# Patient Record
Sex: Female | Born: 1935 | Race: White | Hispanic: No | Smoking: Never smoker
Health system: Southern US, Community
[De-identification: ages and names within clinical notes are randomized; demographics above are authoritative.]

## PROBLEM LIST (undated history)

## (undated) DIAGNOSIS — E119 Type 2 diabetes mellitus without complications: Secondary | ICD-10-CM

## (undated) DIAGNOSIS — M1990 Unspecified osteoarthritis, unspecified site: Secondary | ICD-10-CM

## (undated) DIAGNOSIS — F329 Major depressive disorder, single episode, unspecified: Secondary | ICD-10-CM

## (undated) DIAGNOSIS — F32A Depression, unspecified: Secondary | ICD-10-CM

## (undated) DIAGNOSIS — I10 Essential (primary) hypertension: Secondary | ICD-10-CM

## (undated) DIAGNOSIS — F0390 Unspecified dementia without behavioral disturbance: Secondary | ICD-10-CM

## (undated) DIAGNOSIS — E7800 Pure hypercholesterolemia, unspecified: Secondary | ICD-10-CM

## (undated) HISTORY — PX: CATARACT EXTRACTION W/ INTRAOCULAR LENS  IMPLANT, BILATERAL: SHX1307

---

## 1999-06-09 ENCOUNTER — Encounter (INDEPENDENT_AMBULATORY_CARE_PROVIDER_SITE_OTHER): Payer: Self-pay | Admitting: Specialist

## 1999-06-09 ENCOUNTER — Other Ambulatory Visit: Admission: RE | Admit: 1999-06-09 | Discharge: 1999-06-09 | Payer: Self-pay | Admitting: *Deleted

## 1999-07-17 ENCOUNTER — Encounter: Payer: Self-pay | Admitting: Geriatric Medicine

## 1999-07-17 ENCOUNTER — Encounter: Admission: RE | Admit: 1999-07-17 | Discharge: 1999-07-17 | Payer: Self-pay | Admitting: Geriatric Medicine

## 2001-11-16 ENCOUNTER — Encounter: Payer: Self-pay | Admitting: Geriatric Medicine

## 2001-11-16 ENCOUNTER — Encounter: Admission: RE | Admit: 2001-11-16 | Discharge: 2001-11-16 | Payer: Self-pay | Admitting: Geriatric Medicine

## 2001-12-09 ENCOUNTER — Ambulatory Visit (HOSPITAL_COMMUNITY): Admission: RE | Admit: 2001-12-09 | Discharge: 2001-12-09 | Payer: Self-pay | Admitting: Gastroenterology

## 2001-12-09 ENCOUNTER — Encounter (INDEPENDENT_AMBULATORY_CARE_PROVIDER_SITE_OTHER): Payer: Self-pay | Admitting: Specialist

## 2003-11-17 ENCOUNTER — Other Ambulatory Visit: Admission: RE | Admit: 2003-11-17 | Discharge: 2003-11-17 | Payer: Self-pay | Admitting: Geriatric Medicine

## 2004-11-20 ENCOUNTER — Other Ambulatory Visit: Admission: RE | Admit: 2004-11-20 | Discharge: 2004-11-20 | Payer: Self-pay | Admitting: Geriatric Medicine

## 2004-11-27 ENCOUNTER — Ambulatory Visit (HOSPITAL_COMMUNITY): Admission: RE | Admit: 2004-11-27 | Discharge: 2004-11-27 | Payer: Self-pay | Admitting: Geriatric Medicine

## 2005-10-15 ENCOUNTER — Emergency Department (HOSPITAL_COMMUNITY): Admission: EM | Admit: 2005-10-15 | Discharge: 2005-10-15 | Payer: Self-pay | Admitting: Family Medicine

## 2005-11-28 ENCOUNTER — Ambulatory Visit (HOSPITAL_COMMUNITY): Admission: RE | Admit: 2005-11-28 | Discharge: 2005-11-28 | Payer: Self-pay | Admitting: Geriatric Medicine

## 2005-12-15 ENCOUNTER — Encounter: Admission: RE | Admit: 2005-12-15 | Discharge: 2005-12-15 | Payer: Self-pay | Admitting: Geriatric Medicine

## 2005-12-23 ENCOUNTER — Encounter: Admission: RE | Admit: 2005-12-23 | Discharge: 2005-12-23 | Payer: Self-pay | Admitting: Geriatric Medicine

## 2006-02-23 ENCOUNTER — Encounter: Admission: RE | Admit: 2006-02-23 | Discharge: 2006-02-23 | Payer: Self-pay | Admitting: Orthopedic Surgery

## 2006-03-10 ENCOUNTER — Encounter: Admission: RE | Admit: 2006-03-10 | Discharge: 2006-03-10 | Payer: Self-pay | Admitting: Orthopedic Surgery

## 2006-03-13 ENCOUNTER — Ambulatory Visit (HOSPITAL_BASED_OUTPATIENT_CLINIC_OR_DEPARTMENT_OTHER): Admission: RE | Admit: 2006-03-13 | Discharge: 2006-03-14 | Payer: Self-pay | Admitting: Orthopedic Surgery

## 2006-10-08 ENCOUNTER — Encounter: Admission: RE | Admit: 2006-10-08 | Discharge: 2006-10-08 | Payer: Self-pay | Admitting: Orthopedic Surgery

## 2006-12-01 ENCOUNTER — Ambulatory Visit (HOSPITAL_COMMUNITY): Admission: RE | Admit: 2006-12-01 | Discharge: 2006-12-01 | Payer: Self-pay | Admitting: Geriatric Medicine

## 2007-02-02 ENCOUNTER — Encounter: Admission: RE | Admit: 2007-02-02 | Discharge: 2007-02-02 | Payer: Self-pay | Admitting: Geriatric Medicine

## 2007-12-03 ENCOUNTER — Ambulatory Visit (HOSPITAL_COMMUNITY): Admission: RE | Admit: 2007-12-03 | Discharge: 2007-12-03 | Payer: Self-pay | Admitting: Geriatric Medicine

## 2008-03-24 ENCOUNTER — Other Ambulatory Visit: Admission: RE | Admit: 2008-03-24 | Discharge: 2008-03-24 | Payer: Self-pay | Admitting: Geriatric Medicine

## 2008-12-04 ENCOUNTER — Ambulatory Visit (HOSPITAL_COMMUNITY): Admission: RE | Admit: 2008-12-04 | Discharge: 2008-12-04 | Payer: Self-pay | Admitting: Geriatric Medicine

## 2008-12-08 ENCOUNTER — Encounter: Admission: RE | Admit: 2008-12-08 | Discharge: 2008-12-08 | Payer: Self-pay | Admitting: Geriatric Medicine

## 2009-05-14 ENCOUNTER — Encounter: Admission: RE | Admit: 2009-05-14 | Discharge: 2009-05-14 | Payer: Self-pay | Admitting: Geriatric Medicine

## 2009-11-14 ENCOUNTER — Encounter: Admission: RE | Admit: 2009-11-14 | Discharge: 2009-11-14 | Payer: Self-pay | Admitting: Geriatric Medicine

## 2010-03-29 ENCOUNTER — Other Ambulatory Visit: Admission: RE | Admit: 2010-03-29 | Discharge: 2010-03-29 | Payer: Self-pay | Admitting: Geriatric Medicine

## 2010-04-01 ENCOUNTER — Encounter: Admission: RE | Admit: 2010-04-01 | Discharge: 2010-04-01 | Payer: Self-pay | Admitting: Geriatric Medicine

## 2010-12-10 ENCOUNTER — Other Ambulatory Visit (HOSPITAL_COMMUNITY): Payer: Self-pay | Admitting: Geriatric Medicine

## 2010-12-10 DIAGNOSIS — Z1231 Encounter for screening mammogram for malignant neoplasm of breast: Secondary | ICD-10-CM

## 2010-12-19 ENCOUNTER — Ambulatory Visit (HOSPITAL_COMMUNITY)
Admission: RE | Admit: 2010-12-19 | Discharge: 2010-12-19 | Disposition: A | Discharge status: Home / Self Care | Payer: MEDICARE | Source: Ambulatory Visit | Attending: Geriatric Medicine | Admitting: Geriatric Medicine

## 2010-12-19 DIAGNOSIS — Z1231 Encounter for screening mammogram for malignant neoplasm of breast: Secondary | ICD-10-CM

## 2010-12-27 NOTE — Procedures (Signed)
. [facility]  Patient:    Brandy Bailey, Brandy Bailey Visit Number: [id number] MRN: [id number]          Service Type: END Location: ENDO Attending Physician:  Dennison Bulla Ii Dictated by:   Verlin Grills, M.D. Proc. Date: 12/09/01 Admit Date:  12/09/2001 Discharge Date: 12/09/2001   CC:         Hal T. Stoneking, M.D.   Procedure Report  REFERRING PHYSICIAN:  Hal T. Stoneking, M.D.  PROCEDURE:  Colonoscopy with colonic polypectomy.  PROCEDURE INDICATION:  Brandy Bailey is a 77 year old female born 07-Jan-1927.  Approximately five years ago Brandy Bailey underwent a colonoscopy and a villous adenomatous polyp was removed from her colon.  She is due for screening colonoscopy with polypectomy to prevent colon cancer.  ENDOSCOPIST:  Verlin Grills, M.D.  PREMEDICATION:  Versed 5 mg, fentanyl 50 mcg.  ENDOSCOPE:  Olympus pediatric colonoscope.  DESCRIPTION OF PROCEDURE:  After obtaining informed consent, Brandy Bailey was placed in the left lateral decubitus position.  I administered intravenous Versed and intravenous fentanyl to achieve conscious sedation for the procedure.  The patients blood pressure, oxygen saturation, and cardiac rhythm were monitored throughout the procedure and documented in the medical record.  Anal inspection was normal.  Digital rectal exam was normal.  The Olympus pediatric video colonoscope was introduced into the rectum and easily advanced to the cecum.  Colonic preparation for the exam today was excellent.  Rectum normal.  Sigmoid colon and descending colon:  Small colonic diverticula.  Splenic flexure normal.  Transverse colon normal.  Hepatic flexure normal.  Ascending colon:  From the midascending colon, a 2 mm sessile polyp was removed with the hot biopsy forceps and submitted for pathologic interpretation.  Cecum and ileocecal valve normal.  ASSESSMENT: 1. In 1998, a villous  adenomatous polyp was removed from the colon    colonoscopically. 2. From the midascending colon, a 2 mm sessile polyp was removed with the hot    biopsy forceps. 3. Left colonic diverticulosis.  RECOMMENDATIONS:  Repeat colonoscopy in five years. Dictated by:   Verlin Grills, M.D. Attending Physician:  Dennison Bulla Ii DD:  12/09/01 TD:  12/10/01 Job: [id number]

## 2010-12-27 NOTE — Op Note (Signed)
NAME:  THAILA, BOTTOMS NO.:  [id number]   MEDICAL RECORD NO.:  [id number]          PATIENT TYPE:  AMB   LOCATION:  DSC                          FACILITY:  [facility]   PHYSICIAN:  Dyke Brackett, M.D.    DATE OF BIRTH:  [birth year]   DATE OF PROCEDURE:  03/13/2006  DATE OF DISCHARGE:                                 OPERATIVE REPORT   SURGEON:  Dyke Brackett, M.D.   PREOPERATIVE DIAGNOSIS:  Large tear, complete tear of infraspinatus and  supraspinatus.   POSTOPERATIVE DIAGNOSES:  Large tear, complete tear of infraspinatus and  supraspinatus, degenerative tear of anterior superior labrum, partial biceps  tendon tear, and impingement with acromioclavicular joint arthritis.   OPERATION:  1. Open rotator cuff repair, acromioplasty.  2. Labral debridement (arthroscopic).  3. Open acromioclavicular-distal clavicle excision.   ANESTHESIA:  General with a block.   INDICATIONS:  A 77 year old female with a painful right rotator cuff tear  thought to be amenable to overnight surgery.   DESCRIPTION OF PROCEDURE:  Arthroscoped through posterolateral and anterior  portal.  Severe complete tear of the supraspinatus and infraspinatus was  present.  Fortunately, this was mobilizable enough to repair.  She had an  extensive degenerative tear in the anterior superior labrum, which was  debrided, extensive partial tear, and probably 50% to 75% of the biceps  undersurface tearing, debrided.  Cuff tissue was mobilized.  Again,  glenohumeral debridement with the arthroscope was carried out separate from  the open procedure.  Incision was made, splitting the [facility] joint, deltoid  interval.  Dissection carried to about 2 cm distal to the tip of the  acromion.  Severe impingement from the tip of the acromion, as well as the  [facility] joint was noted.  Distal clavicle excised.  The cuff was repaired with  mobilization of the tissues; extensive mobilization.  The cuff was repaired  back to a good  stable edge with a double-row technique with 2 Arthrex  anchors more distal and 1 more proximal for a total of 6 sutures in the  repaired cuff.  This was done onto a bleeding bone surface, which was  burred.  Good tissue actually was obtained for repair with no undue tension.  The deltoid was repaired back with #1 Tycron, the subcutaneous tissues with  Vicryl.  The patient was placed in a sling, taken to the recovery room in  stable condition.      Dyke Brackett, M.D.  Electronically Signed     WDC/MEDQ  D:  03/13/2006  T:  03/13/2006  Job:  [id number]

## 2011-04-11 ENCOUNTER — Other Ambulatory Visit: Payer: Self-pay | Admitting: Geriatric Medicine

## 2011-04-11 DIAGNOSIS — E049 Nontoxic goiter, unspecified: Secondary | ICD-10-CM

## 2011-04-17 ENCOUNTER — Ambulatory Visit
Admission: RE | Admit: 2011-04-17 | Discharge: 2011-04-17 | Disposition: A | Discharge status: Home / Self Care | Payer: Medicare Other | Source: Ambulatory Visit | Attending: Geriatric Medicine | Admitting: Geriatric Medicine

## 2011-04-17 DIAGNOSIS — E049 Nontoxic goiter, unspecified: Secondary | ICD-10-CM

## 2012-04-23 ENCOUNTER — Other Ambulatory Visit (HOSPITAL_COMMUNITY): Payer: Self-pay | Admitting: Geriatric Medicine

## 2012-04-23 DIAGNOSIS — Z1231 Encounter for screening mammogram for malignant neoplasm of breast: Secondary | ICD-10-CM

## 2012-05-03 ENCOUNTER — Ambulatory Visit (HOSPITAL_COMMUNITY)
Admission: RE | Admit: 2012-05-03 | Discharge: 2012-05-03 | Disposition: A | Discharge status: Home / Self Care | Payer: Medicare Other | Source: Ambulatory Visit | Attending: Geriatric Medicine | Admitting: Geriatric Medicine

## 2012-05-03 DIAGNOSIS — Z1231 Encounter for screening mammogram for malignant neoplasm of breast: Secondary | ICD-10-CM

## 2012-10-13 ENCOUNTER — Emergency Department (HOSPITAL_COMMUNITY): Payer: Medicare Other

## 2012-10-13 ENCOUNTER — Encounter (HOSPITAL_COMMUNITY): Payer: Self-pay | Admitting: Emergency Medicine

## 2012-10-13 ENCOUNTER — Inpatient Hospital Stay (HOSPITAL_COMMUNITY)
Admission: EM | Admit: 2012-10-13 | Discharge: 2012-10-18 | DRG: 481 | Disposition: A | Discharge status: Skilled Nursing Facility | Payer: Medicare Other | Attending: Family Medicine | Admitting: Family Medicine

## 2012-10-13 DIAGNOSIS — S7223XA Displaced subtrochanteric fracture of unspecified femur, initial encounter for closed fracture: Principal | ICD-10-CM | POA: Diagnosis present

## 2012-10-13 DIAGNOSIS — R55 Syncope and collapse: Secondary | ICD-10-CM

## 2012-10-13 DIAGNOSIS — D696 Thrombocytopenia, unspecified: Secondary | ICD-10-CM | POA: Diagnosis not present

## 2012-10-13 DIAGNOSIS — Z79899 Other long term (current) drug therapy: Secondary | ICD-10-CM

## 2012-10-13 DIAGNOSIS — D62 Acute posthemorrhagic anemia: Secondary | ICD-10-CM

## 2012-10-13 DIAGNOSIS — Z7982 Long term (current) use of aspirin: Secondary | ICD-10-CM

## 2012-10-13 DIAGNOSIS — R209 Unspecified disturbances of skin sensation: Secondary | ICD-10-CM | POA: Diagnosis present

## 2012-10-13 DIAGNOSIS — Z66 Do not resuscitate: Secondary | ICD-10-CM | POA: Diagnosis present

## 2012-10-13 DIAGNOSIS — I10 Essential (primary) hypertension: Secondary | ICD-10-CM

## 2012-10-13 DIAGNOSIS — W19XXXA Unspecified fall, initial encounter: Secondary | ICD-10-CM

## 2012-10-13 DIAGNOSIS — E119 Type 2 diabetes mellitus without complications: Secondary | ICD-10-CM

## 2012-10-13 DIAGNOSIS — G459 Transient cerebral ischemic attack, unspecified: Secondary | ICD-10-CM

## 2012-10-13 DIAGNOSIS — S72002D Fracture of unspecified part of neck of left femur, subsequent encounter for closed fracture with routine healing: Secondary | ICD-10-CM

## 2012-10-13 DIAGNOSIS — E785 Hyperlipidemia, unspecified: Secondary | ICD-10-CM | POA: Diagnosis present

## 2012-10-13 DIAGNOSIS — S72002A Fracture of unspecified part of neck of left femur, initial encounter for closed fracture: Secondary | ICD-10-CM

## 2012-10-13 DIAGNOSIS — M1990 Unspecified osteoarthritis, unspecified site: Secondary | ICD-10-CM | POA: Diagnosis present

## 2012-10-13 HISTORY — DX: Unspecified osteoarthritis, unspecified site: M19.90

## 2012-10-13 HISTORY — DX: Essential (primary) hypertension: I10

## 2012-10-13 LAB — CBC
HCT: 30 % — ABNORMAL LOW (ref 36.0–46.0)
Hemoglobin: 9.7 g/dL — ABNORMAL LOW (ref 12.0–15.0)
MCHC: 32.3 g/dL (ref 30.0–36.0)
MCV: 84 fL (ref 78.0–100.0)
RDW: 15 % (ref 11.5–15.5)
WBC: 6.1 10*3/uL (ref 4.0–10.5)

## 2012-10-13 LAB — CREATININE, SERUM
GFR calc Af Amer: 88 mL/min — ABNORMAL LOW (ref >90)
GFR calc non Af Amer: 76 mL/min — ABNORMAL LOW (ref >90)

## 2012-10-13 LAB — URINALYSIS, MICROSCOPIC ONLY
Glucose, UA: NEGATIVE mg/dL
Hgb urine dipstick: NEGATIVE
Protein, ur: 30 mg/dL — AB
Specific Gravity, Urine: 1.023 (ref 1.005–1.030)

## 2012-10-13 LAB — CBC WITH DIFFERENTIAL/PLATELET
Basophils Absolute: 0 10*3/uL (ref 0.0–0.1)
Eosinophils Relative: 1 % (ref 0–5)
Lymphocytes Relative: 17 % (ref 12–46)
Neutro Abs: 3.1 10*3/uL (ref 1.7–7.7)
Neutrophils Relative %: 75 % (ref 43–77)
Platelets: 176 10*3/uL (ref 150–400)
RDW: 15 % (ref 11.5–15.5)
WBC: 4.1 10*3/uL (ref 4.0–10.5)

## 2012-10-13 LAB — BASIC METABOLIC PANEL
Calcium: 10 mg/dL (ref 8.4–10.5)
Creatinine, Ser: 0.75 mg/dL (ref 0.50–1.10)
GFR calc Af Amer: 88 mL/min — ABNORMAL LOW (ref >90)
GFR calc non Af Amer: 76 mL/min — ABNORMAL LOW (ref >90)

## 2012-10-13 LAB — TROPONIN I: Troponin I: <0.3 ng/mL (ref <0.30)

## 2012-10-13 LAB — PROTIME-INR: Prothrombin Time: 13.1 seconds (ref 11.6–15.2)

## 2012-10-13 LAB — ABO/RH: ABO/RH(D): O POS

## 2012-10-13 MED ORDER — SODIUM CHLORIDE 0.9 % IV SOLN
INTRAVENOUS | Status: DC
  Administered 2012-10-13: 20:00:00 via INTRAVENOUS

## 2012-10-13 MED ORDER — SODIUM CHLORIDE 0.9 % IV SOLN
1000.0000 mL | INTRAVENOUS | Status: DC
  Administered 2012-10-13: 1000 mL via INTRAVENOUS

## 2012-10-13 MED ORDER — MORPHINE SULFATE 4 MG/ML IJ SOLN: 0.5000 mg | INTRAMUSCULAR | Status: DC | PRN

## 2012-10-13 MED ORDER — SIMVASTATIN 40 MG PO TABS
40.0000 mg | ORAL_TABLET | Freq: Every evening | ORAL | Status: DC
  Administered 2012-10-13 – 2012-10-17 (×5): 40 mg via ORAL
  Filled 2012-10-13 (×6): qty 1

## 2012-10-13 MED ORDER — FENTANYL CITRATE 0.05 MG/ML IJ SOLN
50.0000 ug | INTRAMUSCULAR | Status: AC | PRN
  Administered 2012-10-13 (×2): 50 ug via INTRAVENOUS
  Filled 2012-10-13: qty 2

## 2012-10-13 MED ORDER — ASPIRIN EC 81 MG PO TBEC
81.0000 mg | DELAYED_RELEASE_TABLET | Freq: Every day | ORAL | Status: DC
  Administered 2012-10-13 – 2012-10-18 (×6): 81 mg via ORAL
  Filled 2012-10-13 (×6): qty 1

## 2012-10-13 MED ORDER — MORPHINE SULFATE 4 MG/ML IJ SOLN
INTRAMUSCULAR | Status: AC
  Filled 2012-10-13: qty 1

## 2012-10-13 MED ORDER — ENOXAPARIN SODIUM 30 MG/0.3ML ~~LOC~~ SOLN
30.0000 mg | SUBCUTANEOUS | Status: DC
  Administered 2012-10-13: 30 mg via SUBCUTANEOUS
  Filled 2012-10-13 (×2): qty 0.3

## 2012-10-13 MED ORDER — MIRTAZAPINE 7.5 MG PO TABS
7.5000 mg | ORAL_TABLET | Freq: Every day | ORAL | Status: DC
  Administered 2012-10-13 – 2012-10-17 (×4): 7.5 mg via ORAL
  Filled 2012-10-13 (×6): qty 1

## 2012-10-13 MED ORDER — MORPHINE SULFATE 4 MG/ML IJ SOLN
1.0000 mg | INTRAMUSCULAR | Status: DC | PRN
  Administered 2012-10-13: 1 mg via INTRAVENOUS
  Filled 2012-10-13: qty 1

## 2012-10-13 MED ORDER — DOCUSATE SODIUM 100 MG PO CAPS
100.0000 mg | ORAL_CAPSULE | Freq: Two times a day (BID) | ORAL | Status: DC
  Administered 2012-10-13 – 2012-10-18 (×9): 100 mg via ORAL
  Filled 2012-10-13 (×11): qty 1

## 2012-10-13 MED ORDER — HYDROCODONE-ACETAMINOPHEN 5-325 MG PO TABS
1.0000 | ORAL_TABLET | Freq: Four times a day (QID) | ORAL | Status: DC | PRN
  Administered 2012-10-13 – 2012-10-14 (×3): 1 via ORAL
  Filled 2012-10-13 (×3): qty 1

## 2012-10-13 NOTE — ED Notes (Signed)
Onset today 1030 patient fell left arm and and left weakness onto tile floor at a store.  EMS called states left arm and left leg weakness resolved however left hip pain 7/10 sharp position of comfort 3/10 sharp. Pedal pulses present full sensation. EMS reported left lower extremity shortening and external rotation.

## 2012-10-13 NOTE — H&P (Signed)
[facility] History and Physical  Brandy Bailey ZOX:[id number] DOB: 07/13/1927 DOA: 10/13/2012  Referring physician: Dr Bernette Mayers. PCP: Ginette Otto, MD  Specialists: Dr Dion Saucier.  Chief Complaint: Fall.   HPI: Brandy Bailey is a 79 y.o. female with PMH significant for HTN, Diabetes who presents after a fall, questionable of syncope episode. Patient was shoping with daughter when her left side become numb, lightheaded, and she fell to left side. She is not sure if she pass out. She think maybe she pass out for few seconds. She denies chest pain, dyspnea, slurred speech. I was not able to speak with her daughter. She relates prior episodes of left side numbness. she was told by her PCP this is could be pinch nerve.   Review of Systems: The patient denies anorexia, ,, vision loss, decreased hearing, hoarseness, chest pain, , dyspnea on exertion, peripheral edema,  hemoptysis, abdominal pain, melena, hematochezia, severe indigestion/heartburn, hematuria, incontinence, genital sores, muscle weakness, suspicious skin lesions, transient blindness, difficulty walking, depression, , abnormal bleeding, enlarged lymph nodes   Past Medical History  Diagnosis Date  . Hypertension   . Osteoarthritis   . Diabetes mellitus without complication    History reviewed. No pertinent past surgical history. Social History:  reports that she has never smoked. She does not have any smokeless tobacco history on file. She reports that she does not drink alcohol or use illicit drugs. Lives with Daughter. Her daughter has Ca and drink alcohol. She has 2 living daughter and 2 deceased. One died at age of 1 had Down syndrome.     No Known Allergies  Family History: Father died, MI, had history prostate Ca.                          Mother: Died MI.   Prior to Admission medications   Medication Sig Start Date End Date Taking? Authorizing Provider  amLODipine (NORVASC) 10 MG tablet Take 10 mg by mouth  daily.   Yes Historical Provider, MD  aspirin EC 81 MG tablet Take 81 mg by mouth daily.   Yes Historical Provider, MD  Calcium Carbonate-Vitamin D (CALCIUM PLUS VITAMIN D PO) Take 1 tablet by mouth 2 (two) times daily.   Yes Historical Provider, MD  hydrochlorothiazide (HYDRODIURIL) 50 MG tablet Take 25 mg by mouth daily.   Yes Historical Provider, MD  lisinopril (PRINIVIL,ZESTRIL) 10 MG tablet Take 10 mg by mouth daily.   Yes Historical Provider, MD  mirtazapine (REMERON) 15 MG tablet Take 7.5 mg by mouth at bedtime.   Yes Historical Provider, MD  sertraline (ZOLOFT) 100 MG tablet Take 100 mg by mouth at bedtime.   Yes Historical Provider, MD  simvastatin (ZOCOR) 40 MG tablet Take 40 mg by mouth every evening.   Yes Historical Provider, MD   Physical Exam: Filed Vitals:   10/13/12 1200 10/13/12 1230 10/13/12 1417 10/13/12 1450  BP: 114/55 100/53 104/50 106/50  Pulse: 66 63  64  Temp:      TempSrc:      Resp: 14 16 16 16   SpO2: 100% 99% 99% 99%   General Appearance:    Alert, cooperative, no distress, appears stated age  Head:    Normocephalic, without obvious abnormality, atraumatic  Eyes:    PERRL, conjunctiva/corneas clear, EOM's intact,    Ears:    Normal TM's and external ear canals, both ears  Nose:   Nares normal, septum midline, mucosa normal, no drainage  or sinus tenderness  Throat:   Lips, mucosa, and tongue normal; teeth and gums normal  Neck:   Supple, symmetrical, trachea midline, no adenopathy;    thyroid:  no enlargement/tenderness/nodules; no carotid   bruit or JVD  Back:     Symmetric, no curvature, ROM normal, no CVA tenderness  Lungs:     Clear to auscultation bilaterally, respirations unlabored  Chest Wall:    No tenderness or deformity   Heart:    Regular rate and rhythm, S1 and S2 normal, systolic murmur, rub   or gallop     Abdomen:     Soft, non-tender, bowel sounds active all four quadrants,    no masses, no organomegaly        Extremities:   Left LE  rotated, Right LE no edema.   Pulses:   2+ and symmetric all extremities  Skin:   Skin color, texture, turgor normal, no rashes or lesions  Lymph nodes:   Cervical, supraclavicular, and axillary nodes normal  Neurologic:   CNII-XII intact, normal strength, left LE difficult evaluation due to pain, , sensation and reflexes    throughout      Labs on Admission:  Basic Metabolic Panel:  Recent Labs Lab 10/13/12 1155  NA 140  K 3.7  CL 100  CO2 27  GLUCOSE 117*  BUN 25*  CREATININE 0.75  CALCIUM 10.0   Liver Function Tests: No results found for this basename: AST, ALT, ALKPHOS, BILITOT, PROT, ALBUMIN,  in the last 168 hours No results found for this basename: LIPASE, AMYLASE,  in the last 168 hours No results found for this basename: AMMONIA,  in the last 168 hours CBC:  Recent Labs Lab 10/13/12 1155  WBC 4.1  NEUTROABS 3.1  HGB 12.7  HCT 38.8  MCV 83.6  PLT 176   Cardiac Enzymes:  Recent Labs Lab 10/13/12 1155  TROPONINI <0.30    BNP (last 3 results) No results found for this basename: PROBNP,  in the last 8760 hours CBG: No results found for this basename: GLUCAP,  in the last 168 hours  Radiological Exams on Admission: Dg Chest 1 View  10/13/2012  *RADIOLOGY REPORT*  Clinical Data: Fall, dizziness, weakness  CHEST - 1 VIEW  Comparison: 03/10/2006  Findings: Chronic interstitial markings/emphysematous changes.  No pleural effusion or pneumothorax.  The heart is normal in size.  Large hiatal hernia.  IMPRESSION: No evidence of acute cardiopulmonary disease.   Original Report Authenticated By: Charline Bills, M.D.    Dg Hip Complete Left  10/13/2012  *RADIOLOGY REPORT*  Clinical Data: Fall, left hip pain  LEFT HIP - COMPLETE 2+ VIEW  Comparison: None.  Findings: Comminuted intertrochanteric left hip fracture. Foreshortening with varus deformity.  Displaced lesser trochanteric fragment.  Mild degenerative changes of the right hip and lower lumbar spine.   IMPRESSION: Comminuted intertrochanteric left hip fracture, as described above.   Original Report Authenticated By: Charline Bills, M.D.    Ct Head Wo Contrast  10/13/2012  *RADIOLOGY REPORT*  Clinical Data: Fall.  Left sided numbness.  High blood pressure.  .  CT HEAD WITHOUT CONTRAST  Technique:  Contiguous axial images were obtained from the base of the skull through the vertex without contrast.  Comparison: None.  Findings: No skull fracture or intracranial hemorrhage.  Small vessel disease type changes without CT evidence of large acute infarct.  It would be difficult to exclude a small infarct given the white matter type changes.  Vascular calcifications.  Probable  calcification of the anterior falx rather than small meningioma.  Overall, no intracranial mass lesion noted on this unenhanced exam.  No hydrocephalus.  Mastoid air cells, middle ear cavities and visualized sinuses are clear  IMPRESSION: No skull fracture or intracranial hemorrhage.  Small vessel disease type changes.  Please see above discussion.   Original Report Authenticated By: Lacy Duverney, M.D.     EKG: Independently reviewed. Sinus rhythm.   Assessment/Plan Active Problems:   Closed left hip fracture   Syncope   Diabetes   Hypertension   1-Syncope ? : Admit to telemetry, cycle cardiac enzymes, Will order ECHO. MRI brain rule out stroke. Maybe related to hypotension. Might be dificult to perform orthostatic due to hip fracture.  2-Left Hip fracture: Will need MRI brain, ECHO result prior to proceed with surgery.  3-HTN: Hold BP medications SBP in the 100 range.  4-Transient left side numbness: CT head negative. MRI brain ordered. Continue with aspirin. 5-DVT prophylaxis: Lovenox.   Code Status: Patient wishes to be DNR. She understand DNR is cancel during surgery.  Family Communication: Care discussed with patient.  Disposition Plan: Expect 4 to 5 days.  Time spent: 70 minutes.   REGALADO,BELKYS [facility] Pager [phone]  If 7PM-7AM, please contact night-coverage [email or web address] Password [facility] 10/13/2012, 3:09 PM

## 2012-10-13 NOTE — ED Notes (Signed)
ECHO called 2D ECHO to be performed at bedside.

## 2012-10-13 NOTE — ED Notes (Signed)
Spoke with Admit Doctor to change pain medication order

## 2012-10-13 NOTE — ED Notes (Signed)
ECHO at bedside.

## 2012-10-13 NOTE — ED Notes (Signed)
Room assignment being changed due to admission orders.

## 2012-10-13 NOTE — Progress Notes (Signed)
  Echocardiogram 2D Echocardiogram has been performed.  [place] 10/13/2012, 4:32 PM

## 2012-10-13 NOTE — Consult Note (Signed)
ORTHOPAEDIC CONSULTATION  REQUESTING PHYSICIAN: Brandy Cory, MD  Chief Complaint: Left hip pain  HPI: Brandy Bailey is a 79 y.o. female who complains of  left hip pain after a fall. There is some question whether or not she lost consciousness. She did not have any pre-existing left hip pain. The pain is rated as moderate to severe, better with pain medication, worse with movement.  Past Medical History  Diagnosis Date  . Hypertension   . Osteoarthritis   . Diabetes mellitus without complication    History reviewed. No pertinent past surgical history. History   Social History  . Marital Status: Widowed    Spouse Name: N/A    Number of Children: N/A  . Years of Education: N/A   Social History Main Topics  . Smoking status: Never Smoker   . Smokeless tobacco: None  . Alcohol Use: No  . Drug Use: No  . Sexually Active: None   Other Topics Concern  . None   Social History Narrative  . None   No family history on file. her father died of a heart attack in his 45s. No Known Allergies Prior to Admission medications   Medication Sig Start Date End Date Taking? Authorizing Provider  amLODipine (NORVASC) 10 MG tablet Take 10 mg by mouth daily.   Yes Historical Provider, MD  aspirin EC 81 MG tablet Take 81 mg by mouth daily.   Yes Historical Provider, MD  Calcium Carbonate-Vitamin D (CALCIUM PLUS VITAMIN D PO) Take 1 tablet by mouth 2 (two) times daily.   Yes Historical Provider, MD  hydrochlorothiazide (HYDRODIURIL) 50 MG tablet Take 25 mg by mouth daily.   Yes Historical Provider, MD  lisinopril (PRINIVIL,ZESTRIL) 10 MG tablet Take 10 mg by mouth daily.   Yes Historical Provider, MD  mirtazapine (REMERON) 15 MG tablet Take 7.5 mg by mouth at bedtime.   Yes Historical Provider, MD  sertraline (ZOLOFT) 100 MG tablet Take 100 mg by mouth at bedtime.   Yes Historical Provider, MD  simvastatin (ZOCOR) 40 MG tablet Take 40 mg by mouth every evening.   Yes Historical  Provider, MD   Dg Chest 1 View  10/13/2012  *RADIOLOGY REPORT*  Clinical Data: Fall, dizziness, weakness  CHEST - 1 VIEW  Comparison: 03/10/2006  Findings: Chronic interstitial markings/emphysematous changes.  No pleural effusion or pneumothorax.  The heart is normal in size.  Large hiatal hernia.  IMPRESSION: No evidence of acute cardiopulmonary disease.   Original Report Authenticated By: Charline Bills, M.D.    Dg Hip Complete Left  10/13/2012  *RADIOLOGY REPORT*  Clinical Data: Fall, left hip pain  LEFT HIP - COMPLETE 2+ VIEW  Comparison: None.  Findings: Comminuted intertrochanteric left hip fracture. Foreshortening with varus deformity.  Displaced lesser trochanteric fragment.  Mild degenerative changes of the right hip and lower lumbar spine.  IMPRESSION: Comminuted intertrochanteric left hip fracture, as described above.   Original Report Authenticated By: Charline Bills, M.D.    Ct Head Wo Contrast  10/13/2012  *RADIOLOGY REPORT*  Clinical Data: Fall.  Left sided numbness.  High blood pressure.  .  CT HEAD WITHOUT CONTRAST  Technique:  Contiguous axial images were obtained from the base of the skull through the vertex without contrast.  Comparison: None.  Findings: No skull fracture or intracranial hemorrhage.  Small vessel disease type changes without CT evidence of large acute infarct.  It would be difficult to exclude a small infarct given the white matter type changes.  Vascular  calcifications.  Probable calcification of the anterior falx rather than small meningioma.  Overall, no intracranial mass lesion noted on this unenhanced exam.  No hydrocephalus.  Mastoid air cells, middle ear cavities and visualized sinuses are clear  IMPRESSION: No skull fracture or intracranial hemorrhage.  Small vessel disease type changes.  Please see above discussion.   Original Report Authenticated By: Lacy Duverney, M.D.     Positive ROS: All other systems have been reviewed and were otherwise negative with  the exception of those mentioned in the HPI and as above.  Physical Exam: General: Alert, no acute distress mildly frail. Cardiovascular: No pedal edema Respiratory: No cyanosis, no use of accessory musculature GI: No organomegaly, abdomen is soft and non-tender Skin: No lesions in the area of chief complaint, with the exception of some bruising. Neurologic: Sensation intact distally Psychiatric: Patient is competent for consent with normal mood and affect Lymphatic: No axillary or cervical lymphadenopathy  MUSCULOSKELETAL: Left leg is shortened and externally rotated. There is deformity. She has pain to palpation proximally around the proximal femur. EHL and FHL are firing.  Assessment: Left subtrochanteric/intertrochanteric femur fracture.  Plan: This is an acute severe injury, and has significant risks of both for morbidity and mortality. I discussed the options with her, including nonsurgical versus surgical intervention, and recommended surgical management.  The risks benefits and alternatives were discussed with the patient including but not limited to the risks of nonoperative treatment, versus surgical intervention including infection, bleeding, nerve injury, malunion, nonunion, the need for revision surgery, hardware prominence, hardware failure, the need for hardware removal, blood clots, cardiopulmonary complications, morbidity, mortality, among others, and they were willing to proceed.    She is currently being optimized by the medical service, with workup for the possible loss of consciousness, and we are going to plan for surgery tomorrow evening if she is optimized. She certainly may develop acute blood loss anemia do to the type of fracture, and would anticipate needing blood available for the next 24-48 hours.    Brandy Bailey P, MD Cell [phone] Pager [phone]  10/13/2012 8:22 PM

## 2012-10-13 NOTE — ED Provider Notes (Signed)
History     CSN: [id number]  Arrival date & time 10/13/12  1127   First MD Initiated Contact with Patient 10/13/12 1132      Chief Complaint  Patient presents with  . Fall    (Consider location/radiation/quality/duration/timing/severity/associated sxs/prior treatment) Patient is a 79 y.o. female presenting with fall.  Fall   Pt reports she was shopping this morning, just prior to arrival, when she felt the left side of her body go numb. She states the next thing she knew she was on the floor. She is unsure what happened, may have had a brief loss of consciousness. She has had similar episodes of numbness off and on for a few months, states she has been seen by her PCP for same and told 'it might be a pinched nerve' but no workup was done. Numbness has since resolved. During the fall she injured her L hip. She is complaining of severe aching pain, worse with movement. Denies any other injury, doesn't think she hit her head, does not have neck pain.   Past Medical History  Diagnosis Date  . Hypertension   . Osteoarthritis   . Diabetes mellitus without complication     History reviewed. No pertinent past surgical history.  No family history on file.  History  Substance Use Topics  . Smoking status: Never Smoker   . Smokeless tobacco: Not on file  . Alcohol Use: No    OB History   Grav Para Term Preterm Abortions TAB SAB Ect Mult Living                  Review of Systems All other systems reviewed and are negative except as noted in HPI.   Allergies  Review of patient's allergies indicates no known allergies.  Home Medications  No current outpatient prescriptions on file.  BP 131/100  Temp(Src) 97.6 F (36.4 C) (Oral)  Resp 16  SpO2 100%  Physical Exam  Nursing note and vitals reviewed. Constitutional: She is oriented to person, place, and time. She appears well-developed and well-nourished.  HENT:  Head: Normocephalic and atraumatic.  Eyes: EOM are normal.  Pupils are equal, round, and reactive to light.  Neck: Normal range of motion. Neck supple.  Non-tender in midline  Cardiovascular: Normal rate, normal heart sounds and intact distal pulses.   Pulmonary/Chest: Effort normal and breath sounds normal.  Abdominal: Bowel sounds are normal. She exhibits no distension. There is no tenderness.  Musculoskeletal: She exhibits tenderness (L hip). She exhibits no edema.  L leg shortened and externally rotated, neurovascularly intact  Neurological: She is alert and oriented to person, place, and time. She has normal strength. No cranial nerve deficit or sensory deficit.  Skin: Skin is warm and dry. No rash noted.  Psychiatric: She has a normal mood and affect.    ED Course  Procedures (including critical care time)  Labs Reviewed  BASIC METABOLIC PANEL - Abnormal; Notable for the following:    Glucose, Bld 117 (*)    BUN 25 (*)    GFR calc non Af Amer 76 (*)    GFR calc Af Amer 88 (*)    All other components within normal limits  URINALYSIS, MICROSCOPIC ONLY - Abnormal; Notable for the following:    Color, Urine AMBER (*)    APPearance CLOUDY (*)    Bilirubin Urine SMALL (*)    Protein, ur 30 (*)    Squamous Epithelial / LPF FEW (*)    Casts HYALINE CASTS (*)  All other components within normal limits  URINE CULTURE  CBC WITH DIFFERENTIAL  PROTIME-INR  TROPONIN I  TYPE AND SCREEN  ABO/RH   Dg Chest 1 View  10/13/2012  *RADIOLOGY REPORT*  Clinical Data: Fall, dizziness, weakness  CHEST - 1 VIEW  Comparison: 03/10/2006  Findings: Chronic interstitial markings/emphysematous changes.  No pleural effusion or pneumothorax.  The heart is normal in size.  Large hiatal hernia.  IMPRESSION: No evidence of acute cardiopulmonary disease.   Original Report Authenticated By: Charline Bills, M.D.    Dg Hip Complete Left  10/13/2012  *RADIOLOGY REPORT*  Clinical Data: Fall, left hip pain  LEFT HIP - COMPLETE 2+ VIEW  Comparison: None.  Findings:  Comminuted intertrochanteric left hip fracture. Foreshortening with varus deformity.  Displaced lesser trochanteric fragment.  Mild degenerative changes of the right hip and lower lumbar spine.  IMPRESSION: Comminuted intertrochanteric left hip fracture, as described above.   Original Report Authenticated By: Charline Bills, M.D.    Ct Head Wo Contrast  10/13/2012  *RADIOLOGY REPORT*  Clinical Data: Fall.  Left sided numbness.  High blood pressure.  .  CT HEAD WITHOUT CONTRAST  Technique:  Contiguous axial images were obtained from the base of the skull through the vertex without contrast.  Comparison: None.  Findings: No skull fracture or intracranial hemorrhage.  Small vessel disease type changes without CT evidence of large acute infarct.  It would be difficult to exclude a small infarct given the white matter type changes.  Vascular calcifications.  Probable calcification of the anterior falx rather than small meningioma.  Overall, no intracranial mass lesion noted on this unenhanced exam.  No hydrocephalus.  Mastoid air cells, middle ear cavities and visualized sinuses are clear  IMPRESSION: No skull fracture or intracranial hemorrhage.  Small vessel disease type changes.  Please see above discussion.   Original Report Authenticated By: Lacy Duverney, M.D.      No diagnosis found.    MDM  Syncope vs TIA leading to a fall. Suspect hip fracture. Order set initiated including parenteral pain meds.    Date: 10/13/2012  Rate: 64  Rhythm: normal sinus rhythm  QRS Axis: normal  Intervals: PR shortened  ST/T Wave abnormalities: normal  Conduction Disutrbances:none  Narrative Interpretation:   Old EKG Reviewed: none available  2:21 PM Pain improved. Hip xray as above shows fracture. Pt established with Dr. Madelon Lips, will consult his office before calling hospitalist.       Bonnita Levan. Bernette Mayers, MD 10/13/12 2002

## 2012-10-13 NOTE — ED Notes (Signed)
Admit Doctor at bedside.  

## 2012-10-14 ENCOUNTER — Inpatient Hospital Stay (HOSPITAL_COMMUNITY): Payer: Medicare Other

## 2012-10-14 ENCOUNTER — Inpatient Hospital Stay (HOSPITAL_COMMUNITY): Payer: Medicare Other | Admitting: Anesthesiology

## 2012-10-14 ENCOUNTER — Encounter (HOSPITAL_COMMUNITY): Payer: Self-pay | Admitting: Anesthesiology

## 2012-10-14 ENCOUNTER — Encounter (HOSPITAL_COMMUNITY)
Admission: EM | Disposition: A | Discharge status: Skilled Nursing Facility | Payer: Self-pay | Source: Home / Self Care | Attending: Family Medicine

## 2012-10-14 HISTORY — PX: INTRAMEDULLARY (IM) NAIL INTERTROCHANTERIC: SHX5875

## 2012-10-14 LAB — CBC
HCT: 28.9 % — ABNORMAL LOW (ref 36.0–46.0)
Hemoglobin: 9.5 g/dL — ABNORMAL LOW (ref 12.0–15.0)
MCHC: 32.9 g/dL (ref 30.0–36.0)
RBC: 3.49 MIL/uL — ABNORMAL LOW (ref 3.87–5.11)

## 2012-10-14 LAB — BASIC METABOLIC PANEL
BUN: 25 mg/dL — ABNORMAL HIGH (ref 6–23)
CO2: 26 mEq/L (ref 19–32)
Glucose, Bld: 112 mg/dL — ABNORMAL HIGH (ref 70–99)
Potassium: 3.8 mEq/L (ref 3.5–5.1)
Sodium: 138 mEq/L (ref 135–145)

## 2012-10-14 LAB — GLUCOSE, CAPILLARY
Glucose-Capillary: 96 mg/dL (ref 70–99)
Glucose-Capillary: 85 mg/dL (ref 70–99)
Glucose-Capillary: 105 mg/dL — ABNORMAL HIGH (ref 70–99)

## 2012-10-14 SURGERY — FIXATION, FRACTURE, INTERTROCHANTERIC, WITH INTRAMEDULLARY ROD
Anesthesia: General | Site: Hip | Laterality: Left | Wound class: Clean

## 2012-10-14 MED ORDER — SENNA 8.6 MG PO TABS
1.0000 | ORAL_TABLET | Freq: Two times a day (BID) | ORAL | Status: DC
  Administered 2012-10-15 – 2012-10-18 (×7): 8.6 mg via ORAL
  Filled 2012-10-14 (×8): qty 1

## 2012-10-14 MED ORDER — HYDROCODONE-ACETAMINOPHEN 5-325 MG PO TABS: 1.0000 | ORAL_TABLET | Freq: Four times a day (QID) | ORAL | Status: DC | PRN

## 2012-10-14 MED ORDER — SENNA-DOCUSATE SODIUM 8.6-50 MG PO TABS: 1.0000 | ORAL_TABLET | Freq: Every day | ORAL | Status: DC

## 2012-10-14 MED ORDER — 0.9 % SODIUM CHLORIDE (POUR BTL) OPTIME
TOPICAL | Status: DC | PRN
  Administered 2012-10-14: 1000 mL

## 2012-10-14 MED ORDER — LIDOCAINE HCL (CARDIAC) 20 MG/ML IV SOLN
INTRAVENOUS | Status: DC | PRN
  Administered 2012-10-14: 60 mg via INTRAVENOUS

## 2012-10-14 MED ORDER — CEFAZOLIN SODIUM-DEXTROSE 2-3 GM-% IV SOLR
INTRAVENOUS | Status: DC | PRN
  Administered 2012-10-14: 2 g via INTRAVENOUS

## 2012-10-14 MED ORDER — SODIUM CHLORIDE 0.9 % IV SOLN
10.0000 mg | INTRAVENOUS | Status: DC | PRN
  Administered 2012-10-14: 50 ug/min via INTRAVENOUS

## 2012-10-14 MED ORDER — ALUM & MAG HYDROXIDE-SIMETH 200-200-20 MG/5ML PO SUSP: 30.0000 mL | ORAL | Status: DC | PRN

## 2012-10-14 MED ORDER — PROPOFOL 10 MG/ML IV BOLUS
INTRAVENOUS | Status: DC | PRN
  Administered 2012-10-14: 100 mg via INTRAVENOUS

## 2012-10-14 MED ORDER — FENTANYL CITRATE 0.05 MG/ML IJ SOLN
INTRAMUSCULAR | Status: DC | PRN
  Administered 2012-10-14: 100 ug via INTRAVENOUS

## 2012-10-14 MED ORDER — ACETAMINOPHEN 650 MG RE SUPP: 650.0000 mg | Freq: Four times a day (QID) | RECTAL | Status: DC | PRN

## 2012-10-14 MED ORDER — ONDANSETRON HCL 4 MG PO TABS: 4.0000 mg | ORAL_TABLET | Freq: Four times a day (QID) | ORAL | Status: DC | PRN

## 2012-10-14 MED ORDER — NEOSTIGMINE METHYLSULFATE 1 MG/ML IJ SOLN
INTRAMUSCULAR | Status: DC | PRN
  Administered 2012-10-14: 4 mg via INTRAVENOUS

## 2012-10-14 MED ORDER — POTASSIUM CHLORIDE IN NACL 20-0.45 MEQ/L-% IV SOLN
INTRAVENOUS | Status: DC
  Administered 2012-10-15 – 2012-10-18 (×3): via INTRAVENOUS
  Filled 2012-10-14 (×8): qty 1000

## 2012-10-14 MED ORDER — METOCLOPRAMIDE HCL 5 MG/ML IJ SOLN
5.0000 mg | Freq: Three times a day (TID) | INTRAMUSCULAR | Status: DC | PRN
  Filled 2012-10-14: qty 2

## 2012-10-14 MED ORDER — CALCIUM CARBONATE-VITAMIN D 500-200 MG-UNIT PO TABS: 1.0000 | ORAL_TABLET | Freq: Every day | ORAL | Status: DC

## 2012-10-14 MED ORDER — PHENOL 1.4 % MT LIQD: 1.0000 | OROMUCOSAL | Status: DC | PRN

## 2012-10-14 MED ORDER — HYDROMORPHONE HCL PF 1 MG/ML IJ SOLN
0.2500 mg | INTRAMUSCULAR | Status: DC | PRN
  Administered 2012-10-14 (×2): 0.25 mg via INTRAVENOUS

## 2012-10-14 MED ORDER — POLYETHYLENE GLYCOL 3350 17 G PO PACK
17.0000 g | PACK | Freq: Every day | ORAL | Status: DC | PRN
  Filled 2012-10-14: qty 1

## 2012-10-14 MED ORDER — MORPHINE SULFATE 2 MG/ML IJ SOLN: 0.5000 mg | INTRAMUSCULAR | Status: DC | PRN

## 2012-10-14 MED ORDER — LACTATED RINGERS IV SOLN
INTRAVENOUS | Status: DC | PRN
  Administered 2012-10-14: 20:00:00 via INTRAVENOUS

## 2012-10-14 MED ORDER — ENOXAPARIN SODIUM 40 MG/0.4ML ~~LOC~~ SOLN
40.0000 mg | SUBCUTANEOUS | Status: DC
  Administered 2012-10-15 – 2012-10-16 (×2): 40 mg via SUBCUTANEOUS
  Filled 2012-10-14 (×3): qty 0.4

## 2012-10-14 MED ORDER — ACETAMINOPHEN 325 MG PO TABS: 650.0000 mg | ORAL_TABLET | Freq: Four times a day (QID) | ORAL | Status: DC | PRN

## 2012-10-14 MED ORDER — BISACODYL 10 MG RE SUPP: 10.0000 mg | Freq: Every day | RECTAL | Status: DC | PRN

## 2012-10-14 MED ORDER — METOCLOPRAMIDE HCL 5 MG PO TABS
5.0000 mg | ORAL_TABLET | Freq: Three times a day (TID) | ORAL | Status: DC | PRN
  Filled 2012-10-14: qty 2

## 2012-10-14 MED ORDER — HYDROMORPHONE HCL PF 1 MG/ML IJ SOLN
INTRAMUSCULAR | Status: AC
  Filled 2012-10-14: qty 1

## 2012-10-14 MED ORDER — GLYCOPYRROLATE 0.2 MG/ML IJ SOLN
INTRAMUSCULAR | Status: DC | PRN
  Administered 2012-10-14: .6 mg via INTRAVENOUS

## 2012-10-14 MED ORDER — WARFARIN SODIUM 5 MG PO TABS: 5.0000 mg | ORAL_TABLET | Freq: Every day | ORAL | Status: DC

## 2012-10-14 MED ORDER — ROCURONIUM BROMIDE 100 MG/10ML IV SOLN
INTRAVENOUS | Status: DC | PRN
  Administered 2012-10-14: 40 mg via INTRAVENOUS

## 2012-10-14 MED ORDER — ONDANSETRON HCL 4 MG/2ML IJ SOLN: 4.0000 mg | Freq: Four times a day (QID) | INTRAMUSCULAR | Status: DC | PRN

## 2012-10-14 MED ORDER — CEFAZOLIN SODIUM-DEXTROSE 2-3 GM-% IV SOLR
INTRAVENOUS | Status: AC
  Filled 2012-10-14: qty 50

## 2012-10-14 MED ORDER — HYDROCODONE-ACETAMINOPHEN 5-325 MG PO TABS
1.0000 | ORAL_TABLET | Freq: Four times a day (QID) | ORAL | Status: DC | PRN
  Administered 2012-10-15 (×4): 1 via ORAL
  Administered 2012-10-16: 2 via ORAL
  Administered 2012-10-16: 1 via ORAL
  Administered 2012-10-16 – 2012-10-18 (×4): 2 via ORAL
  Filled 2012-10-14: qty 1
  Filled 2012-10-14: qty 2
  Filled 2012-10-14 (×2): qty 1
  Filled 2012-10-14: qty 2
  Filled 2012-10-14: qty 1
  Filled 2012-10-14 (×4): qty 2

## 2012-10-14 MED ORDER — ONDANSETRON HCL 4 MG/2ML IJ SOLN
INTRAMUSCULAR | Status: DC | PRN
  Administered 2012-10-14: 4 mg via INTRAVENOUS

## 2012-10-14 MED ORDER — CEFAZOLIN SODIUM-DEXTROSE 2-3 GM-% IV SOLR
2.0000 g | Freq: Four times a day (QID) | INTRAVENOUS | Status: AC
  Administered 2012-10-15 (×2): 2 g via INTRAVENOUS
  Filled 2012-10-14 (×4): qty 50

## 2012-10-14 MED ORDER — MENTHOL 3 MG MT LOZG: 1.0000 | LOZENGE | OROMUCOSAL | Status: DC | PRN

## 2012-10-14 MED ORDER — DOCUSATE SODIUM 100 MG PO CAPS: 100.0000 mg | ORAL_CAPSULE | Freq: Two times a day (BID) | ORAL | Status: DC

## 2012-10-14 SURGICAL SUPPLY — 41 items
APL SKNCLS STERI-STRIP NONHPOA (GAUZE/BANDAGES/DRESSINGS)
BENZOIN TINCTURE PRP APPL 2/3 (GAUZE/BANDAGES/DRESSINGS) ×1 IMPLANT
BLADE HELICAL TI 11.0X100 (BLADE) ×1 IMPLANT
BOOTCOVER CLEANROOM LRG (PROTECTIVE WEAR) ×4 IMPLANT
CLOTH BEACON ORANGE TIMEOUT ST (SAFETY) ×2 IMPLANT
COVER MAYO STAND STRL (DRAPES) ×1 IMPLANT
COVER SURGICAL LIGHT HANDLE (MISCELLANEOUS) ×2 IMPLANT
DRAPE STERI IOBAN 125X83 (DRAPES) ×2 IMPLANT
DRSG MEPILEX BORDER 4X4 (GAUZE/BANDAGES/DRESSINGS) ×3 IMPLANT
DRSG MEPILEX BORDER 4X8 (GAUZE/BANDAGES/DRESSINGS) ×2 IMPLANT
DURAPREP 26ML APPLICATOR (WOUND CARE) ×2 IMPLANT
ELECT CAUTERY BLADE 6.4 (BLADE) ×2 IMPLANT
ELECT REM PT RETURN 9FT ADLT (ELECTROSURGICAL) ×2
ELECTRODE REM PT RTRN 9FT ADLT (ELECTROSURGICAL) ×1 IMPLANT
EVACUATOR 1/8 PVC DRAIN (DRAIN) IMPLANT
FACESHIELD LNG OPTICON STERILE (SAFETY) ×4 IMPLANT
GAUZE XEROFORM 5X9 LF (GAUZE/BANDAGES/DRESSINGS) ×1 IMPLANT
GLOVE BIOGEL PI ORTHO PRO SZ8 (GLOVE) ×1
GLOVE ORTHO TXT STRL SZ7.5 (GLOVE) ×4 IMPLANT
GLOVE PI ORTHO PRO STRL SZ8 (GLOVE) ×1 IMPLANT
GLOVE SURG ORTHO 8.0 STRL STRW (GLOVE) ×4 IMPLANT
GOWN STRL NON-REIN LRG LVL3 (GOWN DISPOSABLE) ×2 IMPLANT
KIT ROOM TURNOVER OR (KITS) ×2 IMPLANT
MANIFOLD NEPTUNE II (INSTRUMENTS) ×2 IMPLANT
NAIL TROCH 11X130 380MM (Nail) ×1 IMPLANT
NS IRRIG 1000ML POUR BTL (IV SOLUTION) ×2 IMPLANT
PACK GENERAL/GYN (CUSTOM PROCEDURE TRAY) ×2 IMPLANT
PAD ARMBOARD 7.5X6 YLW CONV (MISCELLANEOUS) ×4 IMPLANT
REAMER ROD DEEP FLUTE 2.5X950 (INSTRUMENTS) ×1 IMPLANT
SCREW LOCK TI 5.0X46 F/IM NAIL (Screw) ×1 IMPLANT
SCREW LOCKING IM 5.0MX42M (Screw) ×1 IMPLANT
STAPLER VISISTAT 35W (STAPLE) ×2 IMPLANT
STRIP CLOSURE SKIN 1/2X4 (GAUZE/BANDAGES/DRESSINGS) ×1 IMPLANT
SUT VIC AB 0 CTB1 27 (SUTURE) ×2 IMPLANT
SUT VIC AB 2-0 FS1 27 (SUTURE) ×2 IMPLANT
SUT VIC AB 2-0 SH 27 (SUTURE)
SUT VIC AB 2-0 SH 27XBRD (SUTURE) IMPLANT
SUT VIC AB 3-0 SH 8-18 (SUTURE) ×2 IMPLANT
TOWEL OR 17X24 6PK STRL BLUE (TOWEL DISPOSABLE) ×2 IMPLANT
TOWEL OR 17X26 10 PK STRL BLUE (TOWEL DISPOSABLE) ×2 IMPLANT
WATER STERILE IRR 1000ML POUR (IV SOLUTION) ×1 IMPLANT

## 2012-10-14 NOTE — Progress Notes (Signed)
Pt Hgb noted 12.7 @ 1155 3/5 blood draw.  Hgb 9.7 @ 2100 3/5 blood draw.  Pt. VSS.  BP remains 100's/50's.  Pt. denies any signs of bleeding at this time.  Pt. educated to call staff if signs bleeding occurs.  Night hospitalist notified.  Will continue to monitor at this time.

## 2012-10-14 NOTE — Anesthesia Preprocedure Evaluation (Addendum)
Anesthesia Evaluation  Patient identified by MRN, date of birth, ID bandGeneral Assessment Comment:History noted.  Reviewed: Allergy & Precautions, H&P , NPO status , Patient's Chart, lab work & pertinent test results  Airway Mallampati: II      Dental   Pulmonary neg pulmonary ROS,  breath sounds clear to auscultation        Cardiovascular hypertension, Rhythm:Regular Rate:Normal     Neuro/Psych    GI/Hepatic negative GI ROS, Neg liver ROS,   Endo/Other  diabetes  Renal/GU negative Renal ROS     Musculoskeletal   Abdominal   Peds  Hematology   Anesthesia Other Findings   Reproductive/Obstetrics                         Anesthesia Physical Anesthesia Plan  ASA: III and emergent  Anesthesia Plan: General   Post-op Pain Management:    Induction: Intravenous  Airway Management Planned: Oral ETT  Additional Equipment:   Intra-op Plan:   Post-operative Plan: Possible Post-op intubation/ventilation  Informed Consent: I have reviewed the patients History and Physical, chart, labs and discussed the procedure including the risks, benefits and alternatives for the proposed anesthesia with the patient or authorized representative who has indicated his/her understanding and acceptance.   Dental advisory given  Plan Discussed with: CRNA, Anesthesiologist and Surgeon  Anesthesia Plan Comments:        Anesthesia Quick Evaluation

## 2012-10-14 NOTE — Transfer of Care (Signed)
Immediate Anesthesia Transfer of Care Note  Patient: Brandy Bailey  Procedure(s) Performed: Procedure(s): INTRAMEDULLARY (IM) NAIL INTERTROCHANTRIC (Left)  Patient Location: PACU  Anesthesia Type:General  Level of Consciousness: sedated  Airway & Oxygen Therapy: Patient connected to nasal cannula oxygen  Post-op Assessment: Report given to PACU RN and Post -op Vital signs reviewed and stable  Post vital signs: Reviewed and stable  Complications: No apparent anesthesia complications

## 2012-10-14 NOTE — Progress Notes (Signed)
PROGRESS NOTE  Brandy Bailey ZOX:[id number] DOB: [birth year] DOA: 10/13/2012 PCP: Ginette Otto, MD  Brief narrative: 79 year old female admitted 10/13/2012 with history of fall questionable syncopal episode where in her left side became numb she fell lightheaded and felt left side. She think she may have passed out for a couple of seconds and did not have any slurred speech-was found to have a left hip fracture [left subtrochanteric/intertrochanteric femur fracture]  Past medical history-As per Problem list Chart reviewed as below- History of large complete infraspinatus/supraspinatus tear status post repair in 2007 History of colonoscopy with polypectomy and history of colonic diverticulosis  Consultants:  Orthopedics Dr. Dion Saucier  Procedures:  Left hip x-ray 3/5 showed comminuted intertrochanteric left hip fracture 10/13/12  CXR 1 view 3/5=neg  Ct head c/o contrast 3/5=No skull fracture or intracranial hemorrhage. Small vessel disease type changes. Please see above discussion.  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 no vomiting no chest pain. Wishes to know if she can go straight home after surgery.    Objective    Interim History: None  Telemetry: NSR  Objective: Filed Vitals:   10/14/12 0000 10/14/12 0200 10/14/12 0400 10/14/12 0600  BP: 101/58 102/60 113/50 110/43  Pulse: 71 70 72 78  Temp: 98.1 F (36.7 C) 98.7 F (37.1 C) 98.7 F (37.1 C) 99.2 F (37.3 C)  TempSrc:    Oral  Resp: 18 18 18    SpO2: 96% 98% 98% 100%    Intake/Output Summary (Last 24 hours) at 10/14/12 0948 Last data filed at 10/14/12 0700  Gross per 24 hour  Intake    540 ml  Output      0 ml  Net    540 ml     Exam:  General: Alert pleasant oriented frail edentulous Cardiovascular: Some S2 no murmur rub or gallop Respiratory: Clinically clear no added sound Abdomen: Soft nontender nondistended Skin significant Neuro grossly intact  Data Reviewed: Basic Metabolic Panel:  Recent Labs Lab 10/13/12 1155 10/13/12 2100 10/14/12 0500  NA 140  --  138  K 3.7  --  3.8  CL 100  --  105  CO2 27  --  26  GLUCOSE 117*  --  112*  BUN 25*  --  25*  CREATININE 0.75 0.76 0.74  CALCIUM 10.0  --  8.5   Liver Function Tests: No results found for this basename: AST, ALT, ALKPHOS, BILITOT, PROT, ALBUMIN,  in the last 168 hours No results found for this basename: LIPASE, AMYLASE,  in the last 168 hours No results found for this basename: AMMONIA,  in the last 168 hours CBC:  Recent Labs Lab 10/13/12 1155 10/13/12 2100 10/14/12 0500  WBC 4.1 6.1 5.5  NEUTROABS 3.1  --   --   HGB 12.7 9.7* 9.5*  HCT 38.8 30.0* 28.9*  MCV 83.6 84.0 82.8  PLT 176 143* 144*   Cardiac Enzymes:  Recent Labs Lab 10/13/12 1155 10/13/12 1547 10/13/12 2100 10/14/12 0500  TROPONINI <0.30 <0.30 <0.30 <0.30   BNP: No components found with this basename: POCBNP,  CBG:  Recent Labs Lab 10/13/12 2011 10/14/12 0846  GLUCAP 127* 96    No results found for this or any previous visit (from the past 240 hour(s)).   Studies:  All Imaging reviewed and is as per above notation   Scheduled Meds: . aspirin EC  81 mg Oral Daily  . docusate sodium  100 mg Oral BID  . mirtazapine  7.5 mg Oral QHS  . simvastatin  40 mg Oral QPM   Continuous Infusions: . sodium chloride 50 mL/hr at 10/13/12 2012     Assessment/Plan: 1. Fall-workup so far negative.  Likely was a case of simple syncope. Telemetry does not show any alarms, MRI is negative, echocardiogram negative. 2. Intertrochanteric fracture on left-per orthopedics. Will need anticoagulation, pain management subsequent surgery PT instructions  to followup 3. Lipidemia continue Zocor 40 mg every afternoon 4. Prior diabetes mellitus-discontinued off metformin 9 months of obesity 5. Left upper extremity paresthesia-probably pinched nerve. Outpatient  Code Status: Full Family Communication: Discussed in detail with daughter at bedside Disposition Plan: Inpatient   Pleas Koch, MD  [facility] Pager [phone] 10/14/2012, 9:48 AM    LOS: 1 day

## 2012-10-14 NOTE — Op Note (Signed)
DATE OF SURGERY:  10/14/2012  TIME: 10:02 PM  PATIENT NAME:  Brandy Bailey  AGE: 79 y.o.  PRE-OPERATIVE DIAGNOSIS:  Left Subtrochanteric Hip Fracture  POST-OPERATIVE DIAGNOSIS:  SAME  PROCEDURE:  INTRAMEDULLARY (IM) NAIL INTERTROCHANTRIC  SURGEON:  LANDAU,JOSHUA P  ASSISTANT:  Janace Litten, OPA-C, present and scrubbed throughout the case, critical for assistance with exposure, retraction, instrumentation, and closure.  OPERATIVE IMPLANTS: Synthes trochanteric femoral nail with interlocking helical blade into the femoral head.  PREOPERATIVE INDICATIONS:  [name] is a 79 y.o. year old who fell and suffered a hip fracture. She was brought into the ER and then admitted and optimized and then elected for surgical intervention.    The risks benefits and alternatives were discussed with the patient including but not limited to the risks of nonoperative treatment, versus surgical intervention including infection, bleeding, nerve injury, malunion, nonunion, hardware prominence, hardware failure, need for hardware removal, blood clots, cardiopulmonary complications, morbidity, mortality, among others, and they were willing to proceed.    OPERATIVE PROCEDURE:  The patient was brought to the operating room and placed in the supine position. General anesthesia was administered, with a foley. She was placed on the fracture table.  Closed reduction was performed under C-arm guidance. The length of the femur was also measured using fluoroscopy. Time out was then performed after sterile prep and drape. She received preoperative antibiotics.  Incision was made proximal to the greater trochanter. A guidewire was placed in the appropriate position. Confirmation was made on AP and lateral views. The above-named nail was opened. I opened the proximal femur with a reamer. I then placed the nail by hand easily down. I did not need to ream the femur.  Once the nail was completely seated, I placed a  guidepin into the femoral head into the center center position. I measured the length, and then reamed the lateral cortex and up into the head. I then placed the helical blade. Slight compression was applied. Anatomic fixation achieved. Bone quality was mediocre.  I then secured the proximal interlocking bolt, and took off a half a turn, and then removed the instruments.  I locked the nail distally with an interlocking bolt.  I first put in a 40 and then changed to a 42.  The nail spanned the length of the femur into the distal metaphysis.  Rotational alignment was confirmed clinically and with radiographic analysis of cortical width.  I took final C-arm pictures AP and lateral the entire length of the leg. Anatomic reconstruction was achieved, and the wounds were irrigated copiously and closed with Vicryl followed by Steri-Strips and sterile gauze for the skin. The patient was awakened and returned to PACU in stable and satisfactory condition. There no complications and the patient tolerated the procedure well.  She will be weightbearing as tolerated, and will be on Lovenox bridging to Coumadin for a period of one month with a goal INR of 2-3.   Teryl Lucy, M.D.

## 2012-10-14 NOTE — Progress Notes (Signed)
This note also relates to the following rows which could not be included: Pulse Rate - Cannot attach notes to unvalidated device data ECG Heart Rate - Cannot attach notes to unvalidated device data Resp - Cannot attach notes to unvalidated device data BP - Cannot attach notes to unvalidated device data SpO2 - Cannot attach notes to unvalidated device data   Dr. Randa Evens called and asked if wanted hydromorphone to be given for pain relief she said yes in small amounts

## 2012-10-14 NOTE — Progress Notes (Signed)
Utilization review completed. Bertha Stanfill, RN, BSN. 

## 2012-10-14 NOTE — Anesthesia Procedure Notes (Signed)
Procedure Name: Intubation Date/Time: 10/14/2012 8:41 PM Performed by: Molli Hazard Pre-anesthesia Checklist: Patient identified, Emergency Drugs available, Suction available and Patient being monitored Patient Re-evaluated:Patient Re-evaluated prior to inductionOxygen Delivery Method: Circle system utilized Preoxygenation: Pre-oxygenation with 100% oxygen Intubation Type: IV induction Ventilation: Mask ventilation without difficulty Laryngoscope Size: Mac and 3 Grade View: Grade I Tube type: Oral Tube size: 7.0 mm Number of attempts: 1 Airway Equipment and Method: Stylet Placement Confirmation: ETT inserted through vocal cords under direct vision,  positive ETCO2 and breath sounds checked- equal and bilateral Secured at: 20 cm Tube secured with: Tape Dental Injury: Teeth and Oropharynx as per pre-operative assessment

## 2012-10-14 NOTE — Anesthesia Postprocedure Evaluation (Signed)
  Anesthesia Post-op Note  Patient: Brandy Bailey  Procedure(s) Performed: Procedure(s): INTRAMEDULLARY (IM) NAIL INTERTROCHANTRIC (Left)  Patient Location: PACU  Anesthesia Type:General  Level of Consciousness: awake  Airway and Oxygen Therapy: Patient Spontanous Breathing  Post-op Pain: mild  Post-op Assessment: Post-op Vital signs reviewed  Post-op Vital Signs: Reviewed  Complications: No apparent anesthesia complications

## 2012-10-14 NOTE — Progress Notes (Signed)
The patient has been re-examined, and the chart reviewed, and there have been no interval changes to the documented history and physical.    The risks benefits and alternatives were discussed with the patient including but not limited to the risks of nonoperative treatment, versus surgical intervention including infection, bleeding, nerve injury, malunion, nonunion, the need for revision surgery, hardware prominence, hardware failure, the need for hardware removal, blood clots, cardiopulmonary complications, morbidity, mortality, among others, and they were willing to proceed.    Workup including MRI for LOC negative.

## 2012-10-14 NOTE — Progress Notes (Signed)
Orthopedic Tech Progress Note Patient Details:  MAKINZEY BANES Apr 30, 1927 [id number]  Patient ID: Noni Saupe, female   DOB: 1926/11/25, 79 y.o.   MRN: [id number] Trapeze bar patient helper  Nikki Dom 10/14/2012, 1:32 PM

## 2012-10-14 NOTE — Progress Notes (Signed)
xrays done

## 2012-10-15 LAB — PROTIME-INR
INR: 1.19 (ref 0.00–1.49)
Prothrombin Time: 14.9 seconds (ref 11.6–15.2)

## 2012-10-15 LAB — GLUCOSE, CAPILLARY: Glucose-Capillary: 121 mg/dL — ABNORMAL HIGH (ref 70–99)

## 2012-10-15 LAB — BASIC METABOLIC PANEL
CO2: 25 mEq/L (ref 19–32)
Calcium: 7.9 mg/dL — ABNORMAL LOW (ref 8.4–10.5)
Chloride: 107 mEq/L (ref 96–112)
GFR calc Af Amer: 87 mL/min — ABNORMAL LOW (ref >90)
Sodium: 141 mEq/L (ref 135–145)

## 2012-10-15 LAB — CBC
Platelets: 158 10*3/uL (ref 150–400)
RBC: 2.99 MIL/uL — ABNORMAL LOW (ref 3.87–5.11)
RDW: 15.5 % (ref 11.5–15.5)
WBC: 11.1 10*3/uL — ABNORMAL HIGH (ref 4.0–10.5)

## 2012-10-15 LAB — PREPARE RBC (CROSSMATCH)

## 2012-10-15 MED ORDER — ACETAMINOPHEN 325 MG PO TABS
650.0000 mg | ORAL_TABLET | Freq: Once | ORAL | Status: AC
  Administered 2012-10-15: 650 mg via ORAL
  Filled 2012-10-15: qty 2

## 2012-10-15 MED ORDER — DIPHENHYDRAMINE HCL 25 MG PO CAPS
25.0000 mg | ORAL_CAPSULE | Freq: Once | ORAL | Status: AC
  Administered 2012-10-15: 25 mg via ORAL
  Filled 2012-10-15: qty 1

## 2012-10-15 MED ORDER — WARFARIN SODIUM 5 MG PO TABS
5.0000 mg | ORAL_TABLET | Freq: Once | ORAL | Status: AC
  Administered 2012-10-15: 5 mg via ORAL
  Filled 2012-10-15: qty 1

## 2012-10-15 MED ORDER — WARFARIN - PHARMACIST DOSING INPATIENT
Freq: Every day | Status: DC
  Administered 2012-10-16 – 2012-10-17 (×2)

## 2012-10-15 MED ORDER — WARFARIN VIDEO
1.0000 | Freq: Once | Status: AC
  Administered 2012-10-15: 1

## 2012-10-15 MED ORDER — COUMADIN BOOK
1.0000 | Freq: Once | Status: AC
  Administered 2012-10-15: 1
  Filled 2012-10-15 (×2): qty 1

## 2012-10-15 NOTE — Care Management Note (Signed)
   CARE MANAGEMENT NOTE 10/15/2012  Patient:  Brandy Bailey, Brandy Bailey   Account Number:  [id number]  Date Initiated:  10/15/2012  Documentation initiated by:  ROYAL,CHERYL  Subjective/Objective Assessment:   Order for CM consult in pt with hip fx.     Action/Plan:   Following for d/c needs as identified by PT/OT eval   Anticipated DC Date:  10/18/2012   Anticipated DC Plan:           Choice offered to / List presented to:             Status of service:  In process, will continue to follow Medicare Important Message given?   (If response is "NO", the following Medicare IM given date fields will be blank) Date Medicare IM given:   Date Additional Medicare IM given:    Discharge Disposition:    Per UR Regulation:    If discussed at Long Length of Stay Meetings, dates discussed:    Comments:

## 2012-10-15 NOTE — Progress Notes (Signed)
Physical Therapy Evaluation Patient Details Name: Brandy Bailey MRN: [id number] DOB: February 10, 1927 Today's Date: 10/15/2012 Time: 4098-1191 PT Time Calculation (min): 27 min  PT Assessment / Plan / Recommendation Clinical Impression  pt s/p fall due to questionable syncope ( pt reports numbness L side).  Pt sustained a L intratrochaneteric hip fx, s/p IM Nailing.  Pt can benefit from PT to maximize Independence.    PT Assessment  Patient needs continued PT services    Follow Up Recommendations  SNF    Does the patient have the potential to tolerate intense rehabilitation      Barriers to Discharge Decreased caregiver support      Equipment Recommendations  Other (comment) (TBD post acute)    Recommendations for Other Services     Frequency Min 3X/week    Precautions / Restrictions Precautions Precautions: Fall Restrictions Weight Bearing Restrictions: Yes LLE Weight Bearing: Weight bearing as tolerated   Pertinent Vitals/Pain       Mobility  Bed Mobility Bed Mobility: Supine to Sit;Sitting - Scoot to Edge of Bed Supine to Sit: 4: Min assist;With rails;HOB flat Sitting - Scoot to [place] of Bed: 4: Min guard Details for Bed Mobility Assistance: vc's for hand placement; min truncal assist Transfers Transfers: Sit to Stand;Stand to Sit;Stand Pivot Transfers Sit to Stand: 1: +2 Total assist;With upper extremity assist;From bed Sit to Stand: Patient Percentage: 50% Stand to Sit: 3: Mod assist;With upper extremity assist;To chair/3-in-1 Stand Pivot Transfers: 1: +2 Total assist Stand Pivot Transfers: Patient Percentage: 50% Details for Transfer Assistance: vc's for hand placement and min lifting/steadying assist Ambulation/Gait Ambulation/Gait Assistance: Not tested (comment) Stairs: No    Exercises Total Joint Exercises Ankle Circles/Pumps: AROM;20 reps;Seated Quad Sets: AROM;Both;10 reps;Seated Gluteal Sets: AROM;10 reps;Seated Heel Slides: AAROM;10  reps;Left;Sidelying Long Arc Quad: AAROM;AROM;Left;10 reps;Sidelying   PT Diagnosis: Difficulty walking;Generalized weakness;Acute pain  PT Problem List: Decreased strength;Decreased activity tolerance;Decreased balance;Decreased knowledge of use of DME;Pain PT Treatment Interventions: DME instruction;Functional mobility training;Therapeutic activities;Therapeutic exercise;Balance training;Patient/family education   PT Goals Acute Rehab PT Goals PT Goal Formulation: With patient Time For Goal Achievement: 10/29/12 Potential to Achieve Goals: Good Pt will go Supine/Side to Sit: with supervision PT Goal: Supine/Side to Sit - Progress: Goal set today Pt will go Sit to Stand: with supervision PT Goal: Sit to Stand - Progress: Goal set today Pt will Ambulate: 16 - 50 feet;with supervision;with least restrictive assistive device PT Goal: Ambulate - Progress: Goal set today  Visit Information  Last PT Received On: 10/15/12 Assistance Needed: +2 (gait)    Subjective Data  Subjective: It's time Patient Stated Goal: Eventually back home   Prior Functioning  Home Living Lives With: Daughter Available Help at Discharge: Family (doesn't have alot of steady help) Type of Home: House Home Access: Stairs to enter [employer] of Steps: several Entrance Stairs-Rails: Right;Left Home Layout: Two level;Able to live on main level with bedroom/bathroom Bathroom Shower/Tub: Engineer, manufacturing systems: Standard Prior Function Level of Independence: Independent with assistive device(s) Able to Take Stairs?: Yes Communication Communication: No difficulties    Cognition  Cognition Overall Cognitive Status: Appears within functional limits for tasks assessed/performed Arousal/Alertness: Awake/alert Orientation Level: Appears intact for tasks assessed Behavior During Session: [facility] for tasks performed    Extremity/Trunk Assessment Right Lower Extremity Assessment RLE  ROM/Strength/Tone: Within functional levels Left Lower Extremity Assessment LLE ROM/Strength/Tone: Deficits LLE ROM/Strength/Tone Deficits: weak and painful at 3/5   Balance Balance Balance Assessed: Yes Static Sitting Balance Static Sitting -  Balance Support: Feet supported;Left upper extremity supported;Right upper extremity supported Static Sitting - Level of Assistance: 5: Stand by assistance Static Sitting - Comment/# of Minutes: 5  End of Session PT - End of Session Activity Tolerance: Patient tolerated treatment well Patient left: in chair;with call bell/phone within reach Nurse Communication: Mobility status  GP     Mottinger, Eliseo Gum 10/15/2012, 3:08 PM  10/15/2012  Graton Bing, PT [phone] (pager)

## 2012-10-15 NOTE — Progress Notes (Signed)
ANTICOAGULATION CONSULT NOTE - Initial Consult  Pharmacy Consult for warfarin Indication: VTE prophylaxis  No Known Allergies  Patient Measurements: Height: 5\' 6"  (167.6 cm) Weight: 125 lb 14.4 oz (57.108 kg) IBW/kg (Calculated) : 59.3  Vital Signs: Temp: 98.8 F (37.1 C) (03/07 0000) Temp src: Axillary (03/07 0000) BP: 110/40 mmHg (03/07 0000) Pulse Rate: 83 (03/06 2328)  Labs:  Recent Labs  10/13/12 1155 10/13/12 1547 10/13/12 2100 10/14/12 0500  HGB 12.7  --  9.7* 9.5*  HCT 38.8  --  30.0* 28.9*  PLT 176  --  143* 144*  LABPROT 13.1  --   --   --   INR 1.00  --   --   --   CREATININE 0.75  --  0.76 0.74  TROPONINI <0.30 <0.30 <0.30 <0.30    Estimated Creatinine Clearance: 48 ml/min (by C-G formula based on Cr of 0.74).   Medical History: Past Medical History  Diagnosis Date  . Hypertension   . Osteoarthritis   . Diabetes mellitus without complication     Medications:  Scheduled:  . aspirin EC  81 mg Oral Daily  . ceFAZolin      .  ceFAZolin (ANCEF) IV  2 g Intravenous Q6H  . docusate sodium  100 mg Oral BID  . enoxaparin (LOVENOX) injection  40 mg Subcutaneous Q24H  . HYDROmorphone      . mirtazapine  7.5 mg Oral QHS  . senna  1 tablet Oral BID  . simvastatin  40 mg Oral QPM  . [DISCONTINUED] docusate sodium  100 mg Oral BID  . [DISCONTINUED] enoxaparin (LOVENOX) injection  30 mg Subcutaneous Q24H    Assessment: 79 yo female s/p IM nail intertrochantric (left). Pharmacy to manage warfarin for VTE prophylaxis. Patient is also to receive Lovenox 40mg  SQ daily.   Goal of Therapy:  INR 2-3 Monitor platelets by anticoagulation protocol: Yes   Plan:  1. Coumadin 5mg  po today at 18:00. 2. Daily PT / INR 3. Coumadin book / video 4. Coumadin education with pharmacist  Emeline Gins 10/15/2012,12:31 AM

## 2012-10-15 NOTE — Progress Notes (Signed)
Utilization review completed. Bertha Stanfill, RN, BSN. 

## 2012-10-15 NOTE — Evaluation (Signed)
Occupational Therapy Evaluation Patient Details Name: Brandy Bailey MRN: [id number] DOB: [birth year] Today's Date: 10/15/2012 Time: 1308-6578 OT Time Calculation (min): 17 min  OT Assessment / Plan / Recommendation Clinical Impression  Pt admitted s/p questionable syncopal episode and was thus found to have a left hip fracture. Now s/p IM nail.  Will continue to follow acutely in order to address below problem list. Recommending SNF to further progress rehab before eventual return home.    OT Assessment  Patient needs continued OT Services    Follow Up Recommendations  SNF    Barriers to Discharge Decreased caregiver support    Equipment Recommendations  3 in 1 bedside comode    Recommendations for Other Services    Frequency  Min 2X/week    Precautions / Restrictions Precautions Precautions: Fall Restrictions Weight Bearing Restrictions: Yes LLE Weight Bearing: Weight bearing as tolerated   Pertinent Vitals/Pain See vitals    ADL  Grooming: Performed;Brushing hair;Set up Where Assessed - Grooming: Supported sitting Upper Body Bathing: Simulated;Set up Where Assessed - Upper Body Bathing: Supported sitting Lower Body Bathing: Simulated;Maximal assistance Where Assessed - Lower Body Bathing: Supported sitting Upper Body Dressing: Simulated;Set up Where Assessed - Upper Body Dressing: Supported sitting Lower Body Dressing: Simulated;+1 Total assistance Where Assessed - Lower Body Dressing: Supported sitting Toilet Transfer: Simulated;+2 Total assistance Toilet Transfer: Patient Percentage: 60% Statistician Method: Sit to Barista:  Nurse, children's) Equipment Used: Gait belt;Rolling walker Transfers/Ambulation Related to ADLs: +2 total assist (pt=60%) for sit<>stand from recliner. ADL Comments: Pt limited by left hip pain. Pt verbalizing concern that she is afraid of hurting her leg again.  Educated pt that she is WBAT on LLE, and pt verbalized  undestanding.    OT Diagnosis: Generalized weakness;Acute pain  OT Problem List: Decreased strength;Decreased activity tolerance;Impaired balance (sitting and/or standing);Decreased knowledge of use of DME or AE;Pain OT Treatment Interventions: Self-care/ADL training;DME and/or AE instruction;Therapeutic activities;Patient/family education;Balance training   OT Goals Acute Rehab OT Goals OT Goal Formulation: With patient Time For Goal Achievement: 10/29/12 Potential to Achieve Goals: Good ADL Goals Pt Will Perform Grooming: with supervision;Standing at sink ADL Goal: Grooming - Progress: Goal set today Pt Will Transfer to Toilet: with supervision;Ambulation;with DME;Comfort height toilet ADL Goal: Toilet Transfer - Progress: Goal set today Pt Will Perform Toileting - Clothing Manipulation: with supervision;Standing ADL Goal: Toileting - Clothing Manipulation - Progress: Goal set today Pt Will Perform Toileting - Hygiene: with supervision;Sit to stand from 3-in-1/toilet ADL Goal: Toileting - Hygiene - Progress: Goal set today Miscellaneous OT Goals Miscellaneous OT Goal #1: Pt will perform bed mobility at supervision level as precursor for EOB ADLs. OT Goal: Miscellaneous Goal #1 - Progress: Goal set today Miscellaneous OT Goal #2: Pt will perform sit<>stand transfer with supervision as precursor for toilet transfer. OT Goal: Miscellaneous Goal #2 - Progress: Goal set today  Visit Information  Last OT Received On: 10/15/12 Assistance Needed: +2    Subjective Data  Subjective: "I need to be stronger before I return home in case I need to get away from my daughter"  Patient Stated Goal: return home   Prior Functioning     Home Living Lives With: Daughter Available Help at Discharge: Family Type of Home: House Home Access: Stairs to enter [employer] of Steps: several Entrance Stairs-Rails: Right;Left Home Layout: Two level;Able to live on main level with  bedroom/bathroom Bathroom Shower/Tub: Engineer, manufacturing systems: Standard Additional Comments: Pt reporting that her daughter (whom she  lives with) has a hx of alcohol abuse and that she cannot rely on her daughter for help in case she starts drinking. Pt making comment during session that she needs to be strong enough to get away from her daughter in case daughter is drunk. Prior Function Level of Independence: Independent Able to Take Stairs?: Yes Driving: Yes Comments: Pt enjoys doing clothing alterations for people. Communication Communication: No difficulties Dominant Hand: Right         Vision/Perception     Cognition  Cognition Overall Cognitive Status: Appears within functional limits for tasks assessed/performed Arousal/Alertness: Awake/alert Orientation Level: Appears intact for tasks assessed Behavior During Session: [facility] for tasks performed    Extremity/Trunk Assessment Right Upper Extremity Assessment RUE ROM/Strength/Tone: [facility] for tasks assessed (4/5) Left Upper Extremity Assessment LUE ROM/Strength/Tone: WFL for tasks assessed (4/5) Right Lower Extremity Assessment RLE ROM/Strength/Tone: Within functional levels Left Lower Extremity Assessment LLE ROM/Strength/Tone: Deficits LLE ROM/Strength/Tone Deficits: weak and painful at 3/5     Mobility Bed Mobility Bed Mobility: Not assessed (pt up in chair)  Transfers Transfers: Sit to Stand;Stand to Sit Sit to Stand: 1: +2 Total assist;From chair/3-in-1;With armrests;With upper extremity assist Sit to Stand: Patient Percentage: 60% Stand to Sit: 3: Mod assist;To chair/3-in-1;With armrests;With upper extremity assist Details for Transfer Assistance: Pt stood x2 attempts with vc's for safe hand placement and sequencing. Assist for power up from chair and steadying.     Exercise    Balance    End of Session OT - End of Session Equipment Utilized During Treatment: Gait belt Activity Tolerance: Patient  limited by pain;Patient limited by fatigue Patient left: in chair;with call bell/phone within reach  GO   10/15/2012 Cipriano Mile OTR/L Pager [phone] Office [phone]    Cipriano Mile 10/15/2012, 4:07 PM

## 2012-10-15 NOTE — Progress Notes (Signed)
PROGRESS NOTE  Brandy Bailey ZOX:[id number] DOB: 10/21/1926 DOA: 10/13/2012 PCP: Ginette Otto, MD  Brief narrative: 79 year old female admitted 10/13/2012 with history of fall questionable syncopal episode where in her left side became numb she fell lightheaded and felt left side. She think she may have passed out for a couple of seconds and did not have any slurred speech-was found to have a left hip fracture [left subtrochanteric/intertrochanteric femur fracture]. She underwnet operative fixation 3.6.14 and did well PT/Ot to see patient to clear  Past medical history-As per Problem list Chart reviewed as below- History of large complete infraspinatus/supraspinatus tear status post repair in 2007 History of colonoscopy with polypectomy and history of colonic diverticulosis  Consultants:  Orthopedics Dr. Dion Saucier  Procedures:  Left hip x-ray 3/5 showed comminuted intertrochanteric left hip fracture 10/13/12  CXR 1 view 3/5=neg  Ct head c/o contrast 3/5=No skull fracture or intracranial hemorrhage. Small vessel disease type changes. Please see above discussion.  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  Wishes to know if she can be off of oxygen No distress    Objective    Interim History: None  Telemetry: NSR  Objective: Filed Vitals:   10/15/12 0030 10/15/12 0045 10/15/12 0500 10/15/12 1333  BP: 106/46 108/48 101/49 105/48  Pulse:   101 85  Temp:   97.6 F (36.4 C) 98.5 F (36.9 C)  TempSrc:   Oral Oral  Resp:   18 16  Height:      Weight:   57.471 kg (126 lb 11.2 oz)   SpO2: 100% 100% 99% 100%    Intake/Output Summary (Last 24 hours) at 10/15/12 1400 Last data filed at 10/15/12 1300  Gross per 24 hour  Intake   2635 ml  Output   1200 ml   Net   1435 ml    Exam:  General: Alert pleasant oriented frail edentulous Cardiovascular: Some S2 no murmur rub or gallop Respiratory: Clinically clear no added sound Abdomen: Soft nontender nondistended Skin significant Neuro grossly intact  Data Reviewed: Basic Metabolic Panel:  Recent Labs Lab 10/13/12 1155 10/13/12 2100 10/14/12 0500 10/15/12 0635  NA 140  --  138 141  K 3.7  --  3.8 3.8  CL 100  --  105 107  CO2 27  --  26 25  GLUCOSE 117*  --  112* 158*  BUN 25*  --  25* 31*  CREATININE 0.75 0.76 0.74 0.78  CALCIUM 10.0  --  8.5 7.9*   Liver Function Tests: No results found for this basename: AST, ALT, ALKPHOS, BILITOT, PROT, ALBUMIN,  in the last 168 hours No results found for this basename: LIPASE, AMYLASE,  in the last 168 hours No results found for this basename: AMMONIA,  in the last 168 hours CBC:  Recent Labs Lab 10/13/12 1155 10/13/12 2100 10/14/12 0500 10/15/12 0635  WBC 4.1 6.1 5.5 11.1*  NEUTROABS 3.1  --   --   --   HGB 12.7 9.7* 9.5* 8.1*  HCT 38.8 30.0* 28.9* 25.6*  MCV 83.6 84.0 82.8 85.6  PLT 176 143* 144* 158   Cardiac Enzymes:  Recent Labs Lab 10/13/12 1155 10/13/12 1547 10/13/12 2100 10/14/12 0500  TROPONINI <0.30 <0.30 <0.30 <0.30   BNP: No components found with this basename: POCBNP,  CBG:  Recent Labs Lab  10/14/12 1110 10/14/12 1644 10/14/12 2252 10/15/12 0751 10/15/12 1157  GLUCAP 85 99 105* 134* 128*    Recent Results (from the past 240 hour(s))  URINE CULTURE     Status: None   Collection Time    10/13/12 11:47 AM      Result Value Range Status   Specimen Description URINE, CATHETERIZED   Final   Special Requests NONE   Final   Culture  Setup Time 10/13/2012 13:20   Final   Colony Count NO GROWTH   Final   Culture NO GROWTH   Final   Report Status 10/14/2012 FINAL   Final     Studies:              All Imaging reviewed and is as per above notation   Scheduled Meds: . acetaminophen  650 mg Oral  Once  . aspirin EC  81 mg Oral Daily  . coumadin book  1 each Does not apply Once  . diphenhydrAMINE  25 mg Oral Once  . docusate sodium  100 mg Oral BID  . enoxaparin (LOVENOX) injection  40 mg Subcutaneous Q24H  . mirtazapine  7.5 mg Oral QHS  . senna  1 tablet Oral BID  . simvastatin  40 mg Oral QPM  . warfarin  5 mg Oral ONCE-1800  . warfarin  1 each Does not apply Once  . Warfarin - Pharmacist Dosing Inpatient   Does not apply q1800   Continuous Infusions: . 0.45 % NaCl with KCl 20 mEq / L 75 mL/hr at 10/15/12 0030     Assessment/Plan: 1. Fall-workup so far negative.  Likely was a case of simple syncope. Telemetry does not show any alarms, MRI is negative, echocardiogram negative. 2. Intertrochanteric fracture on left-s/p IO nailing. Will need anticoagulation, pain management with Vicodin. OT PT instructions to followup- she is WB as tolerated per Ortho.  Start lovenox--> coumadin bridge 3. Lipidemia continue Zocor 40 mg every afternoon 4. Prior diabetes mellitus-discontinued off metformin 9 months of obesity 5. Left upper extremity paresthesia-probably pinched nerve. Outpatient follow-up  Code Status: Full Family Communication: Discussed in detail 3.8.14 Disposition Plan: Inpatient   Pleas Koch, MD  [facility] Pager [phone] 10/15/2012, 2:00 PM    LOS: 2 days

## 2012-10-15 NOTE — Progress Notes (Signed)
Patient ID: Brandy Bailey, female   DOB: 11/02/1926, 79 y.o.   MRN: [id number]     Subjective:  Patient reports pain as mild to moderate.  She states that she is doing very well and her leg feels much better.  Objective:   VITALS:   Filed Vitals:   10/15/12 0030 10/15/12 0045 10/15/12 0500 10/15/12 1333  BP: 106/46 108/48 101/49 105/48  Pulse:   101 85  Temp:   97.6 F (36.4 C) 98.5 F (36.9 C)  TempSrc:   Oral Oral  Resp:   18 16  Height:      Weight:   57.471 kg (126 lb 11.2 oz)   SpO2: 100% 100% 99% 100%    ABD soft Sensation intact distally Dorsiflexion/Plantar flexion intact Incision: dressing C/D/I and scant drainage   Lab Results  Component Value Date   WBC 11.1* 10/15/2012   HGB 8.1* 10/15/2012   HCT 25.6* 10/15/2012   MCV 85.6 10/15/2012   PLT 158 10/15/2012     Assessment/Plan: 1 Day Post-Op   Active Problems:   Closed left hip fracture   Syncope   Diabetes   Hypertension   Acute blood loss anemia   Advance diet Up with therapy Continue plan per medicine ABLA will plan to give 2 units PRBC's   Torrie Mayers, BRANDON 10/15/2012, 2:24 PM   Teryl Lucy, MD Cell [phone] Pager [phone]

## 2012-10-16 LAB — GLUCOSE, CAPILLARY: Glucose-Capillary: 133 mg/dL — ABNORMAL HIGH (ref 70–99)

## 2012-10-16 LAB — CBC
MCH: 27.8 pg (ref 26.0–34.0)
MCHC: 33.5 g/dL (ref 30.0–36.0)
Platelets: 131 10*3/uL — ABNORMAL LOW (ref 150–400)
RBC: 3.27 MIL/uL — ABNORMAL LOW (ref 3.87–5.11)
RDW: 15 % (ref 11.5–15.5)

## 2012-10-16 LAB — BASIC METABOLIC PANEL
CO2: 26 mEq/L (ref 19–32)
Calcium: 8.2 mg/dL — ABNORMAL LOW (ref 8.4–10.5)
Creatinine, Ser: 0.6 mg/dL (ref 0.50–1.10)
GFR calc Af Amer: >90 mL/min (ref >90)
GFR calc non Af Amer: 82 mL/min — ABNORMAL LOW (ref >90)
Sodium: 139 mEq/L (ref 135–145)

## 2012-10-16 LAB — TYPE AND SCREEN
ABO/RH(D): O POS
Antibody Screen: NEGATIVE
Unit division: 0

## 2012-10-16 LAB — PROTIME-INR: Prothrombin Time: 16.6 seconds — ABNORMAL HIGH (ref 11.6–15.2)

## 2012-10-16 MED ORDER — WARFARIN SODIUM 5 MG PO TABS
5.0000 mg | ORAL_TABLET | Freq: Once | ORAL | Status: AC
  Administered 2012-10-16: 5 mg via ORAL
  Filled 2012-10-16: qty 1

## 2012-10-16 NOTE — Progress Notes (Signed)
Seen in room 3030  Afebrile vital signs sable  S: no complaints -   O: numerous family members present, obviously much beloved  A: stable  P: SNF vs. Home with a daughter who wants to take her home and has worked in [employer], [employer]/film/video.

## 2012-10-16 NOTE — Progress Notes (Signed)
Physical Therapy Treatment Patient Details Name: Brandy Bailey MRN: [id number] DOB: [birth year] Today's Date: 10/16/2012 Time: 4098-1191 PT Time Calculation (min): 33 min  PT Assessment / Plan / Recommendation Comments on Treatment Session  pt progressing daily.  Knee/hip flexion painful and progressing slowly.    Follow Up Recommendations  SNF     Does the patient have the potential to tolerate intense rehabilitation     Barriers to Discharge        Equipment Recommendations   (TBA post acute)    Recommendations for Other Services    Frequency Min 3X/week   Plan Discharge plan remains appropriate;Frequency remains appropriate    Precautions / Restrictions Precautions Precautions: Fall Restrictions Weight Bearing Restrictions: Yes LLE Weight Bearing: Weight bearing as tolerated   Pertinent Vitals/Pain     Mobility  Bed Mobility Bed Mobility: Supine to Sit;Sitting - Scoot to Edge of Bed (bridging with min assist) Supine to Sit: 4: Min assist;With rails;HOB flat Sitting - Scoot to Edge of Bed: 4: Min guard Details for Bed Mobility Assistance: vc's for hand placement; min truncal assist Transfers Transfers: Sit to Stand;Stand to Sit Sit to Stand: 1: +2 Total assist;With armrests;With upper extremity assist;From bed Sit to Stand: Patient Percentage: 60% Stand to Sit: 4: Min assist;With upper extremity assist;To chair/3-in-1 Details for Transfer Assistance: vc's for hand placement,  lifting assist Ambulation/Gait Ambulation/Gait Assistance: 3: Mod assist;4: Min assist Ambulation Distance (Feet): 12 Feet Assistive device: Rolling walker Ambulation/Gait Assistance Details: difficulty w/shifting R and advancing L LE., assist with w/shift and stability Gait Pattern: Step-to pattern;Decreased step length - right;Decreased stance time - left;Decreased stride length Gait velocity: slow Stairs: No    Exercises Total Joint Exercises Ankle Circles/Pumps: AROM;20  reps;Seated Quad Sets: AROM;Both;10 reps;Seated Gluteal Sets: AROM;10 reps;Seated Heel Slides: AAROM;10 reps;Left;Sidelying Hip ABduction/ADduction: AAROM;Both;10 reps;Seated Bridges: AROM;5 reps;Supine   PT Diagnosis:    PT Problem List:   PT Treatment Interventions:     PT Goals Acute Rehab PT Goals PT Goal Formulation: With patient Time For Goal Achievement: 10/29/12 Potential to Achieve Goals: Good Pt will go Supine/Side to Sit: with supervision PT Goal: Supine/Side to Sit - Progress: Progressing toward goal Pt will go Sit to Stand: with supervision PT Goal: Sit to Stand - Progress: Progressing toward goal Pt will Ambulate: 16 - 50 feet;with supervision;with least restrictive assistive device PT Goal: Ambulate - Progress: Progressing toward goal  Visit Information  Last PT Received On: 10/16/12 Assistance Needed: +2    Subjective Data  Subjective: Okay, I'll be sure to ask the nurses to help me up tommorrow.   Cognition  Cognition Overall Cognitive Status: Appears within functional limits for tasks assessed/performed Arousal/Alertness: Awake/alert Orientation Level: Appears intact for tasks assessed Behavior During Session: [facility] for tasks performed    Balance     End of Session PT - End of Session Activity Tolerance: Patient tolerated treatment well Patient left: in chair;with call bell/phone within reach Nurse Communication: Mobility status   GP     Mottinger, Eliseo Gum 10/16/2012, 3:35 PM 10/16/2012  [place] Bing, PT [phone] (pager)

## 2012-10-16 NOTE — Progress Notes (Signed)
PROGRESS NOTE  Brandy Bailey:[id number] DOB: 1927-01-10 DOA: 10/13/2012 PCP: Ginette Otto, MD  Brief narrative: 79 year old female admitted 10/13/2012 with history of fall questionable syncopal episode where in her left side became numb she fell lightheaded and felt left side. She think she may have passed out for a couple of seconds and did not have any slurred speech-was found to have a left hip fracture [left subtrochanteric/intertrochanteric femur fracture]. She underwnet operative fixation 3.6.14 and did well PT/Ot recomended SNF placement  Past medical history-As per Problem list Chart reviewed as below- History of large complete infraspinatus/supraspinatus tear status post repair in 2007 History of colonoscopy with polypectomy and history of colonic diverticulosis  Consultants:  Orthopedics Dr. Dion Saucier  Procedures:  Left hip x-ray 3/5 showed comminuted intertrochanteric left hip fracture 10/13/12  CXR 1 view 3/5=neg  Ct head c/o contrast 3/5=No skull fracture or intracranial hemorrhage. Small vessel disease type changes. Please see above discussion.  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  Seems tired today from all of her visits and family being aorund NO cp/n/v/ Hip pain oka    Objective    Interim History: None  Telemetry: NSR  Objective: Filed Vitals:   10/15/12 2208 10/15/12 2308 10/16/12 0015 10/16/12 0500  BP: 113/65 113/52 108/61 99/61  Pulse: 87 94 89 80  Temp: 98.8 F (37.1 C) 99 F (37.2 C) 98.3 F (36.8 C) 98.7 F (37.1 C)  TempSrc: Oral Oral Oral Oral  Resp: 16 14 14 15   Height:      Weight:      SpO2:    92%    Intake/Output Summary (Last 24 hours) at 10/16/12 1643 Last data filed at 10/16/12 0700  Gross per 24  hour  Intake   1490 ml  Output   1000 ml  Net    490 ml    Exam:  General: Alert pleasant oriented frail edentulous Cardiovascular: Some S2 no murmur rub or gallop Respiratory: Clinically clear no added sound   Data Reviewed: Basic Metabolic Panel:  Recent Labs Lab 10/13/12 1155 10/13/12 2100 10/14/12 0500 10/15/12 0635 10/16/12 0535  NA 140  --  138 141 139  K 3.7  --  3.8 3.8 4.0  CL 100  --  105 107 105  CO2 27  --  26 25 26   GLUCOSE 117*  --  112* 158* 126*  BUN 25*  --  25* 31* 25*  CREATININE 0.75 0.76 0.74 0.78 0.60  CALCIUM 10.0  --  8.5 7.9* 8.2*   Liver Function Tests: No results found for this basename: AST, ALT, ALKPHOS, BILITOT, PROT, ALBUMIN,  in the last 168 hours No results found for this basename: LIPASE, AMYLASE,  in the last 168 hours No results found for this basename: AMMONIA,  in the last 168 hours CBC:  Recent Labs Lab 10/13/12 1155 10/13/12 2100 10/14/12 0500 10/15/12 0635 10/16/12 0535  WBC 4.1 6.1 5.5 11.1* 6.9  NEUTROABS 3.1  --   --   --   --   HGB 12.7 9.7* 9.5* 8.1* 9.1*  HCT 38.8 30.0* 28.9* 25.6* 27.2*  MCV 83.6 84.0 82.8 85.6 83.2  PLT 176 143* 144* 158 131*   Cardiac Enzymes:  Recent Labs Lab 10/13/12 1155 10/13/12 1547 10/13/12 2100 10/14/12 0500  TROPONINI <0.30 <0.30 <0.30 <  0.30   BNP: No components found with this basename: POCBNP,  CBG:  Recent Labs Lab 10/15/12 1157 10/15/12 1707 10/15/12 2045 10/16/12 0737 10/16/12 1210  GLUCAP 128* 121* 170* 112* 136*    Recent Results (from the past 240 hour(s))  URINE CULTURE     Status: None   Collection Time    10/13/12 11:47 AM      Result Value Range Status   Specimen Description URINE, CATHETERIZED   Final   Special Requests NONE   Final   Culture  Setup Time 10/13/2012 13:20   Final   Colony Count NO GROWTH   Final   Culture NO GROWTH   Final   Report Status 10/14/2012 FINAL   Final     Studies:              All Imaging reviewed and is as per  above notation   Scheduled Meds: . aspirin EC  81 mg Oral Daily  . docusate sodium  100 mg Oral BID  . enoxaparin (LOVENOX) injection  40 mg Subcutaneous Q24H  . mirtazapine  7.5 mg Oral QHS  . senna  1 tablet Oral BID  . simvastatin  40 mg Oral QPM  . warfarin  5 mg Oral ONCE-1800  . Warfarin - Pharmacist Dosing Inpatient   Does not apply q1800   Continuous Infusions: . 0.45 % NaCl with KCl 20 mEq / L 10 mL/hr at 10/16/12 0018     Assessment/Plan: 1. Fall-workup so far negative.  Likely was a case of simple syncope. Telemetry does not show any alarms-this was d/c 3.8.14, MRI is negative, echocardiogram negative. 2. Intertrochanteric fracture on left-s/p IO nailing. Will need anticoagulation, pain management with Vicodin. OT PT instructions to followup- she is WB as tolerated per Ortho.  Start lovenox--> coumadin bridge, INR 1.38 3. Lipidemia continue Zocor 40 mg every afternoon 4. Prior diabetes mellitus-discontinued off metformin 9 months of obesity 5. Left upper extremity paresthesia-probably pinched nerve. Outpatient follow-up  Code Status: Full Family Communication: None currently at bedside Disposition Plan: Inpatient   Pleas Koch, MD  [facility] Pager [phone] 10/16/2012, 4:43 PM    LOS: 3 days

## 2012-10-16 NOTE — Progress Notes (Signed)
ANTICOAGULATION CONSULT NOTE - Initial Consult  Pharmacy Consult for warfarin Indication: VTE prophylaxis  No Known Allergies  Patient Measurements: Height: 5\' 6"  (167.6 cm) Weight: 126 lb 11.2 oz (57.471 kg) IBW/kg (Calculated) : 59.3  Vital Signs: Temp: 98.7 F (37.1 C) (03/08 0500) Temp src: Oral (03/08 0500) BP: 99/61 mmHg (03/08 0500) Pulse Rate: 80 (03/08 0500)  Labs:  Recent Labs  10/13/12 1155 10/13/12 1547 10/13/12 2100 10/14/12 0500 10/15/12 0635 10/16/12 0535  HGB 12.7  --  9.7* 9.5* 8.1* 9.1*  HCT 38.8  --  30.0* 28.9* 25.6* 27.2*  PLT 176  --  143* 144* 158 131*  LABPROT 13.1  --   --   --  14.9 16.6*  INR 1.00  --   --   --  1.19 1.38  CREATININE 0.75  --  0.76 0.74 0.78 0.60  TROPONINI <0.30 <0.30 <0.30 <0.30  --   --     Estimated Creatinine Clearance: 48.4 ml/min (by C-G formula based on Cr of 0.6).   Medical History: Past Medical History  Diagnosis Date  . Hypertension   . Osteoarthritis   . Diabetes mellitus without complication     Medications:  Scheduled:  . [COMPLETED] acetaminophen  650 mg Oral Once  . aspirin EC  81 mg Oral Daily  . [COMPLETED] coumadin book  1 each Does not apply Once  . [COMPLETED] diphenhydrAMINE  25 mg Oral Once  . docusate sodium  100 mg Oral BID  . enoxaparin (LOVENOX) injection  40 mg Subcutaneous Q24H  . [EXPIRED] HYDROmorphone      . mirtazapine  7.5 mg Oral QHS  . senna  1 tablet Oral BID  . simvastatin  40 mg Oral QPM  . [COMPLETED] warfarin  5 mg Oral ONCE-1800  . [COMPLETED] warfarin  1 each Does not apply Once  . Warfarin - Pharmacist Dosing Inpatient   Does not apply q1800    Assessment: 79 yo female s/p IM nail intertrochantric (left). Pharmacy to manage warfarin for VTE prophylaxis. Patient is also to receive Lovenox 40mg  SQ daily. INR increased slightly 1.19-1.38 after 5mg  coumadin last night, cbc stable, no bleeding noted  Goal of Therapy:  INR 2-3 Monitor platelets by anticoagulation  protocol: Yes   Plan:  1. Coumadin 5mg  po today at 18:00. 2. Daily PT / INR   Bayard Hugger, PharmD, BCPS  Clinical Pharmacist  Pager: [phone]   10/16/2012,11:11 AM

## 2012-10-17 LAB — CBC
HCT: 28.1 % — ABNORMAL LOW (ref 36.0–46.0)
Platelets: 151 10*3/uL (ref 150–400)
RBC: 3.31 MIL/uL — ABNORMAL LOW (ref 3.87–5.11)
RDW: 15.1 % (ref 11.5–15.5)
WBC: 6 10*3/uL (ref 4.0–10.5)

## 2012-10-17 LAB — PROTIME-INR: INR: 2.44 — ABNORMAL HIGH (ref 0.00–1.49)

## 2012-10-17 LAB — GLUCOSE, CAPILLARY: Glucose-Capillary: 112 mg/dL — ABNORMAL HIGH (ref 70–99)

## 2012-10-17 MED ORDER — WARFARIN SODIUM 1 MG PO TABS
1.0000 mg | ORAL_TABLET | Freq: Once | ORAL | Status: AC
  Administered 2012-10-17: 1 mg via ORAL
  Filled 2012-10-17: qty 1

## 2012-10-17 NOTE — Progress Notes (Signed)
Tmax 37.8 C  Vital signs stable  S: she thinks she will go to SNF before going to her daughter's.  She thinks taht she is not yet mobile enough  O: sitting in bed eating lunch  A: awaiting placement  P: to SNF tomorrow

## 2012-10-17 NOTE — Clinical Social Work Placement (Signed)
Clinical Social Work Department CLINICAL SOCIAL WORK PLACEMENT NOTE 10/17/2012  Patient:  Brandy Bailey, Brandy Bailey  Account Number:  [id number] Admit date:  10/13/2012  Clinical Social Worker:  Oswaldo Done  Date/time:  10/17/2012 01:47 PM  Clinical Social Work is seeking post-discharge placement for this patient at the following level of care:   SKILLED NURSING   (*CSW will update this form in Epic as items are completed)   10/17/2012  Patient/family provided with [facility] Department of Clinical Social Work's list of facilities offering this level of care within the geographic area requested by the patient (or if unable, by the patient's family).  10/17/2012  Patient/family informed of their freedom to choose among providers that offer the needed level of care, that participate in Medicare, Medicaid or managed care program needed by the patient, have an available bed and are willing to accept the patient.  10/17/2012  Patient/family informed of [facility]' ownership interest in [facility], as well as of the fact that they are under no obligation to receive care at this facility.  PASARR submitted to EDS on 10/14/2012 PASARR number received from [employer] on 10/14/2012  FL2 transmitted to all facilities in geographic area requested by pt/family on  10/15/2012 FL2 transmitted to all facilities within larger geographic area on   Patient informed that his/her managed care company has contracts with or will negotiate with  certain facilities, including the following:     Patient/family informed of bed offers received:   Patient chooses bed at  Physician recommends and patient chooses bed at    Patient to be transferred to  on   Patient to be transferred to facility by   The following physician request were entered in Epic:   Additional Comments:  Ricke Hey, [place] [phone] (weekend)
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
## 2012-10-17 NOTE — Progress Notes (Signed)
ANTICOAGULATION CONSULT NOTE - Initial Consult  Pharmacy Consult for warfarin Indication: VTE prophylaxis  No Known Allergies  Patient Measurements: Height: 5\' 6"  (167.6 cm) Weight: 128 lb 4.9 oz (58.2 kg) IBW/kg (Calculated) : 59.3  Vital Signs: Temp: 98.5 F (36.9 C) (03/09 0520) Temp src: Oral (03/09 0520) BP: 118/44 mmHg (03/09 0520) Pulse Rate: 82 (03/09 0520)  Labs:  Recent Labs  10/15/12 0635 10/16/12 0535 10/17/12 0544  HGB 8.1* 9.1* 9.2*  HCT 25.6* 27.2* 28.1*  PLT 158 131* 151  LABPROT 14.9 16.6* 25.4*  INR 1.19 1.38 2.44*  CREATININE 0.78 0.60  --     Estimated Creatinine Clearance: 49 ml/min (by C-G formula based on Cr of 0.6).   Medical History: Past Medical History  Diagnosis Date  . Hypertension   . Osteoarthritis   . Diabetes mellitus without complication     Medications:  Scheduled:  . aspirin EC  81 mg Oral Daily  . docusate sodium  100 mg Oral BID  . enoxaparin (LOVENOX) injection  40 mg Subcutaneous Q24H  . mirtazapine  7.5 mg Oral QHS  . senna  1 tablet Oral BID  . simvastatin  40 mg Oral QPM  . [COMPLETED] warfarin  5 mg Oral ONCE-1800  . Warfarin - Pharmacist Dosing Inpatient   Does not apply q1800    Assessment: 79 yo female s/p IM nail intertrochantric (left). Pharmacy to manage warfarin for VTE prophylaxis. Patient is also to receive Lovenox 40mg  SQ daily. INR increased quickly to 2.44 from1.19 after 2 doses of coumadin 5mg , cbc stable, no bleeding noted  Goal of Therapy:  INR 2-3 Monitor platelets by anticoagulation protocol: Yes   Plan:  1. Coumadin 1mg  po x 1 2. D/C lovenox 3. Daily PT / INR   Bayard Hugger, PharmD, BCPS  Clinical Pharmacist  Pager: [phone]   10/17/2012,10:35 AM

## 2012-10-17 NOTE — Progress Notes (Signed)
PROGRESS NOTE  EVETT KASSA ZOX:[id number] DOB: 1927/08/08 DOA: 10/13/2012 PCP: Ginette Otto, MD  Brief narrative: 79 year old female admitted 10/13/2012 with history of fall questionable syncopal episode where in her left side became numb she fell lightheaded and felt left side. She think she may have passed out for a couple of seconds and did not have any slurred speech-was found to have a left hip fracture [left subtrochanteric/intertrochanteric femur fracture]. She underwnet operative fixation 3.6.14 and did well PT/Ot recomended SNF placement  Past medical history-As per Problem list Chart reviewed as below- History of large complete infraspinatus/supraspinatus tear status post repair in 2007 History of colonoscopy with polypectomy and history of colonic diverticulosis  Consultants:  Orthopedics Dr. Dion Saucier  Procedures:  Left hip x-ray 3/5 showed comminuted intertrochanteric left hip fracture 10/13/12  CXR 1 view 3/5=neg  Ct head c/o contrast 3/5=No skull fracture or intracranial hemorrhage. Small vessel disease type changes. Please see above discussion.  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  Family at bedside NO cp/n/v/ Hip pain oka    Objective    Interim History: None  Telemetry: NSR  Objective: Filed Vitals:   10/16/12 2143 10/16/12 2322 10/17/12 0311 10/17/12 0520  BP: 124/45   118/44  Pulse: 92   82  Temp: 100 F (37.8 C)   98.5 F (36.9 C)  TempSrc: Oral   Oral  Resp: 18 16 18 18   Height:      Weight:    58.2 kg (128 lb 4.9 oz)  SpO2: 99% 99%  99%    Intake/Output Summary (Last 24 hours) at 10/17/12 1416 Last data filed at 10/17/12 0521  Gross per 24 hour  Intake    240 ml  Output    800 ml  Net   -560 ml     Exam:  General: Alert pleasant oriented frail edentulous Cardiovascular: Some S2 no murmur rub or gallop Respiratory: Clinically clear no added sound   Data Reviewed: Basic Metabolic Panel:  Recent Labs Lab 10/13/12 1155 10/13/12 2100 10/14/12 0500 10/15/12 0635 10/16/12 0535  NA 140  --  138 141 139  K 3.7  --  3.8 3.8 4.0  CL 100  --  105 107 105  CO2 27  --  26 25 26   GLUCOSE 117*  --  112* 158* 126*  BUN 25*  --  25* 31* 25*  CREATININE 0.75 0.76 0.74 0.78 0.60  CALCIUM 10.0  --  8.5 7.9* 8.2*   Liver Function Tests: No results found for this basename: AST, ALT, ALKPHOS, BILITOT, PROT, ALBUMIN,  in the last 168 hours No results found for this basename: LIPASE, AMYLASE,  in the last 168 hours No results found for this basename: AMMONIA,  in the last 168 hours CBC:  Recent Labs Lab 10/13/12 1155 10/13/12 2100 10/14/12 0500 10/15/12 0635 10/16/12 0535 10/17/12 0544  WBC 4.1 6.1 5.5 11.1* 6.9 6.0  NEUTROABS 3.1  --   --   --   --   --   HGB 12.7 9.7* 9.5* 8.1* 9.1* 9.2*  HCT 38.8 30.0* 28.9* 25.6* 27.2* 28.1*  MCV 83.6 84.0 82.8 85.6 83.2 84.9  PLT 176 143* 144* 158 131* 151   Cardiac Enzymes:  Recent Labs Lab 10/13/12 1155 10/13/12 1547 10/13/12 2100 10/14/12 0500  TROPONINI <0.30 <0.30 <  0.30 <0.30   BNP: No components found with this basename: POCBNP,  CBG:  Recent Labs Lab 10/16/12 0737 10/16/12 1210 10/16/12 1705 10/16/12 2110 10/17/12 0718  GLUCAP 112* 136* 132* 133* 112*    Recent Results (from the past 240 hour(s))  URINE CULTURE     Status: None   Collection Time    10/13/12 11:47 AM      Result Value Range Status   Specimen Description URINE, CATHETERIZED   Final   Special Requests NONE   Final   Culture  Setup Time 10/13/2012 13:20   Final   Colony Count NO GROWTH   Final   Culture NO GROWTH   Final   Report Status 10/14/2012 FINAL   Final     Studies:              All Imaging reviewed and is as per above notation    Scheduled Meds: . aspirin EC  81 mg Oral Daily  . docusate sodium  100 mg Oral BID  . mirtazapine  7.5 mg Oral QHS  . senna  1 tablet Oral BID  . simvastatin  40 mg Oral QPM  . warfarin  1 mg Oral ONCE-1800  . Warfarin - Pharmacist Dosing Inpatient   Does not apply q1800   Continuous Infusions: . 0.45 % NaCl with KCl 20 mEq / L 10 mL/hr at 10/16/12 0018     Assessment/Plan: 1. Fall-workup so far negative.  Likely was a case of simple syncope. Telemetry does not show any alarms-this was d/c 3.8.14, MRI is negative, echocardiogram negative. 2. Intertrochanteric fracture on left-s/p IO nailing. Will need anticoagulation, pain management with Vicodin. OT PT instructions to followup- she is WB as tolerated per Ortho.  Continue coumadin INR 2.44-per pharmacy for D/c dosing 3. Lipidemia continue Zocor 40 mg every afternoon 4. Prior diabetes mellitus-discontinued off metformin 9 months ago 5. Left upper extremity paresthesia-probably pinched nerve. Outpatient follow-up  Code Status: Full Family Communication: discussed with family present at bedside-called but wasn't able to speak with daughter Quinn Plowman at [phone] Disposition Plan: Inpatient   Pleas Koch, MD  [facility] Pager [phone] 10/17/2012, 2:16 PM    LOS: 4 days

## 2012-10-18 ENCOUNTER — Encounter (HOSPITAL_COMMUNITY): Payer: Self-pay | Admitting: Orthopedic Surgery

## 2012-10-18 LAB — PROTIME-INR: Prothrombin Time: 24.2 seconds — ABNORMAL HIGH (ref 11.6–15.2)

## 2012-10-18 LAB — GLUCOSE, CAPILLARY: Glucose-Capillary: 102 mg/dL — ABNORMAL HIGH (ref 70–99)

## 2012-10-18 MED ORDER — HYDROCODONE-ACETAMINOPHEN 5-325 MG PO TABS: 1.0000 | ORAL_TABLET | Freq: Four times a day (QID) | ORAL | Status: DC | PRN

## 2012-10-18 MED ORDER — WARFARIN SODIUM 2 MG PO TABS: 2.0000 mg | ORAL_TABLET | Freq: Every day | ORAL | Status: DC

## 2012-10-18 MED ORDER — SENNA-DOCUSATE SODIUM 8.6-50 MG PO TABS: 1.0000 | ORAL_TABLET | Freq: Every day | ORAL | Status: DC

## 2012-10-18 MED ORDER — WARFARIN SODIUM 2 MG PO TABS: 5.0000 mg | ORAL_TABLET | Freq: Every day | ORAL | Status: DC

## 2012-10-18 NOTE — Progress Notes (Signed)
     Subjective:  Patient reports pain as mild.  Doing well otherwise.  Objective:   VITALS:   Filed Vitals:   10/17/12 1514 10/17/12 2100 10/18/12 0500 10/18/12 0546  BP: 114/60 124/60 109/56 132/67  Pulse: 89 80 77 78  Temp: 98.2 F (36.8 C) 99.8 F (37.7 C) 97.8 F (36.6 C) 97.6 F (36.4 C)  TempSrc: Oral Oral Oral Oral  Resp: 18 18 18 16   Height:      Weight:   57.9 kg (127 lb 10.3 oz)   SpO2: 95% 96% 94% 95%    Neurologically intact Sensation intact distally Dorsiflexion/Plantar flexion intact Incision: dressing C/D/I   Lab Results  Component Value Date   WBC 6.0 10/17/2012   HGB 9.2* 10/17/2012   HCT 28.1* 10/17/2012   MCV 84.9 10/17/2012   PLT 151 10/17/2012     Assessment/Plan: 4 Days Post-Op   Active Problems:   Closed left hip fracture   Syncope   Diabetes   Hypertension   Acute blood loss anemia   Advance diet Up with therapy Discharge to SNF Will sign off. RTC 2 weeks with me.  WBAT, coumadin x 4 weeks, hydrocodone prn.   LANDAU,JOSHUA P 10/18/2012, 8:37 AM   Teryl Lucy, MD Cell [phone] Pager [phone]

## 2012-10-18 NOTE — Progress Notes (Signed)
Brandy Bailey accepted pt for SNF for today. [facility] transportation [phone], p/u will be 1:15 pm today. CSW spoke with pt stated that it is ok to contact daughter Brandy Bailey regarding pt going to a SNF. I called and introduced myself, the dtr hung up on me. Pt stated that she will fill out her own paperwork at [facility].  Sherald Barge, LCSW-A Clinical Social Worker [phone]

## 2012-10-18 NOTE — Discharge Summary (Addendum)
Physician Discharge Summary  Brandy Bailey ZOX:[id number] DOB: 12-24-1926 DOA: 10/13/2012  PCP: Ginette Otto, MD  Admit date: 10/13/2012 Discharge date: 10/18/2012  Time spent: 35 minutes  Recommendations for Outpatient Follow-up:  1. Recommend outpatient reconsideration whether she needs blood pressure medications-they were all held during this admission given borderline blood pressures 2. Recommend repeat basic metabolic panel CBC/INR in about a week 3. Recommend skilled nursing placement for further hip fracture rehabilitation and mobilization 4. Recommend trapeze bar if possible at skilled nursing facility 5. Prescribed Percocet for breakthrough pain. 6. Patient to see Dr. Dion Saucier of orthopedics in about 2 weeks-is to be on Coumadin for DVT prophylaxis subsequent to surgery.    Discharge Diagnoses:  Active Problems:   Closed left hip fracture   Syncope   Diabetes   Hypertension   Acute blood loss anemia   Discharge Condition: Good  Diet recommendation: Heart healthy low-salt  Filed Weights   10/15/12 0500 10/17/12 0520 10/18/12 0500  Weight: 57.471 kg (126 lb 11.2 oz) 58.2 kg (128 lb 4.9 oz) 57.9 kg (127 lb 10.3 oz)    History of present illness:  79 year old female admitted 10/13/2012 with history of fall questionable syncopal episode where in her left side became numb she fell lightheaded and felt left side. She think she may have passed out for a couple of seconds and did not have any slurred speech-was found to have a left hip fracture [left subtrochanteric/intertrochanteric femur fracture].  She underwnet operative fixation 3.6.14 and did well  PT/Ot recomended SNF placement   Hospital Course:  Assessment/Plan:  1. Fall-workup so far negative. Likely was a case of simple syncope-it could have possibly been a TIA but this was thought to be less likely-she was on multiple blood pressure medications that were stopped as per below. Telemetry does not show any  alarms-this was d/c 3.8.14, MRI is negative, echocardiogram negative 2. Intertrochanteric fracture on left-s/p IO nailing. Will need anticoagulation with Coumadin. She'll be discharged home on 2.5 mg daily, this should be checked in less than one week or so. Pain management with Vicodin. OT PT instructions to followup- she is WB as tolerated per Ortho. Continue coumadin INR 2.44-we will order a trapeze bar so that she consider and help herself out of bed. 3. Likely blood loss plus dilutional anemia-her blood count dropped from a baseline of about 12 28.1 but then came back into the 9 range. She will need to have a CBC in the near future. Suspect that this will resolve slowly. 4. Hypertension with episodes of good control of blood pressure medications-I'm starting her back on her amlodipine at low dose. I suspect that her blood pressure medications may played a role in her syncope and recommend that they are restarted only slowly as an outpatient 5. Borderline thrombocytopenia-platelets were in the 170s to 140 ranges-please followup as an outpatient 6. Lipidemia continue Zocor 40 mg every afternoon 7. Prior diabetes mellitus-discontinued off metformin 9 months ago 8. Left upper extremity paresthesia-probably pinched nerve. Outpatient follow-up   Consultants:  Orthopedics Dr. Dion Saucier  Procedures:  Left hip x-ray 3/5 showed comminuted intertrochanteric left hip fracture 10/13/12  CXR 1 view 3/5=neg  Ct head c/o contrast 3/5=No skull fracture or intracranial hemorrhage. Small vessel disease type changes.  MRI brain performed 10/15/2038 showed mild atrophy with white matter disease likely cecal infarct microvascular disease  Echocardiogram 10/14/2012 = EF 60-65% grade 1 diastolic  Antibiotics:  Urine Culture   Discharge Exam: Filed Vitals:   10/17/12 1514  10/17/12 2100 10/18/12 0500 10/18/12 0546  BP: 114/60 124/60 109/56 132/67  Pulse: 89 80 77 78  Temp: 98.2 F (36.8 C) 99.8 F (37.7 C) 97.8  F (36.6 C) 97.6 F (36.4 C)  TempSrc: Oral Oral Oral Oral  Resp: 18 18 18 16   Height:      Weight:   57.9 kg (127 lb 10.3 oz)   SpO2: 95% 96% 94% 95%   Doing well. Alert oriented no distress. States some mild confusion on awakening that is very oriented now.  General: Pleasant alert oriented Cardiovascular: Some S2 ? murmur rub or up Respiratory: Clinically clear Wound to left lower extremity seems clean and healed by 1 intension  Discharge Instructions  Discharge Orders   Future Orders Complete By Expires     Diet - low sodium heart healthy  As directed     Increase activity slowly  As directed     Weight bearing as tolerated  As directed         Medication List    STOP taking these medications       amLODipine 10 MG tablet  Commonly known as:  NORVASC     aspirin EC 81 MG tablet     hydrochlorothiazide 50 MG tablet  Commonly known as:  HYDRODIURIL     lisinopril 10 MG tablet  Commonly known as:  PRINIVIL,ZESTRIL      TAKE these medications       CALCIUM PLUS VITAMIN D PO  Take 1 tablet by mouth 2 (two) times daily.     HYDROcodone-acetaminophen 5-325 MG per tablet  Commonly known as:  NORCO  Take 1-2 tablets by mouth every 6 (six) hours as needed for pain. MAXIMUM TOTAL ACETAMINOPHEN DOSE IS 4000 MG PER DAY     mirtazapine 15 MG tablet  Commonly known as:  REMERON  Take 7.5 mg by mouth at bedtime.     sennosides-docusate sodium 8.6-50 MG tablet  Commonly known as:  SENOKOT-S  Take 1 tablet by mouth daily.     sertraline 100 MG tablet  Commonly known as:  ZOLOFT  Take 100 mg by mouth at bedtime.     simvastatin 40 MG tablet  Commonly known as:  ZOCOR  Take 40 mg by mouth every evening.     warfarin 2 MG tablet  Commonly known as:  COUMADIN  Take 1 tablet (2 mg total) by mouth daily.       Follow-up Information   Follow up with Eulas Post, MD. Schedule an appointment as soon as possible for a visit in 2 weeks.   Contact information:    [address] [place] [address] [phone]       Follow up with Ginette Otto, MD. Schedule an appointment as soon as possible for a visit in 1 week.   Contact information:   [address] [place] [address] [phone]        The results of significant diagnostics from this hospitalization (including imaging, microbiology, ancillary and laboratory) are listed below for reference.    Significant Diagnostic Studies: Dg Chest 1 View  10/13/2012  *RADIOLOGY REPORT*  Clinical Data: Fall, dizziness, weakness  CHEST - 1 VIEW  Comparison: 03/10/2006  Findings: Chronic interstitial markings/emphysematous changes.  No pleural effusion or pneumothorax.  The heart is normal in size.  Large hiatal hernia.  IMPRESSION: No evidence of acute cardiopulmonary disease.   Original Report Authenticated By: Charline Bills, M.D.  Dg Hip Complete Left  10/13/2012  *RADIOLOGY REPORT*  Clinical Data: Fall, left hip pain  LEFT HIP - COMPLETE 2+ VIEW  Comparison: None.  Findings: Comminuted intertrochanteric left hip fracture. Foreshortening with varus deformity.  Displaced lesser trochanteric fragment.  Mild degenerative changes of the right hip and lower lumbar spine.  IMPRESSION: Comminuted intertrochanteric left hip fracture, as described above.   Original Report Authenticated By: Charline Bills, M.D.    Dg Femur Left  10/15/2012  *RADIOLOGY REPORT*  Clinical Data: Left femur ORIF  DG C-ARM 61-120 MIN,LEFT FEMUR - 2 VIEW  Technique: Four fluoroscopic spot views  Comparison:  None.  Findings: Four fluoroscopic spot views demonstrate intramedullary nail fixation of a left intertrochanteric fracture.  No complication.  IMPRESSION: No complication following ORIF left intertrochanteric femur fracture.   Original Report Authenticated By: Genevive Bi, M.D.    Ct Head Wo Contrast  10/13/2012  *RADIOLOGY REPORT*  Clinical Data: Fall.  Left sided numbness.  High  blood pressure.  .  CT HEAD WITHOUT CONTRAST  Technique:  Contiguous axial images were obtained from the base of the skull through the vertex without contrast.  Comparison: None.  Findings: No skull fracture or intracranial hemorrhage.  Small vessel disease type changes without CT evidence of large acute infarct.  It would be difficult to exclude a small infarct given the white matter type changes.  Vascular calcifications.  Probable calcification of the anterior falx rather than small meningioma.  Overall, no intracranial mass lesion noted on this unenhanced exam.  No hydrocephalus.  Mastoid air cells, middle ear cavities and visualized sinuses are clear  IMPRESSION: No skull fracture or intracranial hemorrhage.  Small vessel disease type changes.  Please see above discussion.   Original Report Authenticated By: Lacy Duverney, M.D.    Mr Brain Wo Contrast  10/14/2012  *RADIOLOGY REPORT*  Clinical Data: Syncope.  Episode of transient numbness with a fall and hip fracture.  MRI HEAD WITHOUT CONTRAST  Technique:  Multiplanar, multiecho pulse sequences of the brain and surrounding structures were obtained according to standard protocol without intravenous contrast.  Comparison: CT head without contrast 10/13/2012  Findings: The diffusion weighted images demonstrate no evidence for acute or subacute infarction.  Mild generalized atrophy is present. Mild to moderate periventricular and subcortical white matter changes are evident bilaterally.  Remote lacunar infarcts are present in the right basal ganglia.  Flow is present in the major intracranial arteries.  The patient is status post bilateral lens extractions.  The globes and orbits are otherwise intact.  The paranasal sinuses and mastoid air cells are clear.  IMPRESSION:  1.  No acute intracranial abnormality. 2.  Mild atrophy white matter disease.  This likely reflects the sequelae of chronic microvascular ischemia.   Original Report Authenticated By: Marin Roberts, M.D.    Dg Pelvis Portable  10/15/2012  *RADIOLOGY REPORT*  Clinical Data: Status post internal fixation of left femoral intertrochanteric fracture.  PORTABLE PELVIS  Comparison: Intraoperative images of the left hip performed earlier today at 09:37 p.m.  Findings: There has been successful placement of an intramedullary rod and screw within the left femur, transfixing the comminuted intertrochanteric fracture in grossly anatomic alignment.  A medially displaced lesser trochanteric fragment is seen.  No new fractures are identified.  The left femoral head remains seated at the acetabulum.  There is chronic deformity involving the right greater femoral trochanter, with a displaced fragment, reflecting remote injury. The right hip joint is unremarkable in appearance.  Degenerative  change is noted at the left knee, with tricompartmental osteophyte formation.  Mild sclerotic change is seen at the sacroiliac joints. The visualized bowel gas pattern is grossly unremarkable.  Scattered soft tissue air is noted at the surgical site.  IMPRESSION:  1.  Successful internal fixation of intramedullary rod and screw within the left femur, transfixing the comminuted intertrochanteric fracture in grossly anatomic alignment.  Medially displaced lesser trochanteric fragment again seen. 2.  Chronic deformity involving the right greater femoral trochanter reflects remote injury. 3.  Tricompartmental osteoarthritis at the left knee.   Original Report Authenticated By: Tonia Ghent, M.D.    Dg Hip Portable 1 View Left  10/15/2012  *RADIOLOGY REPORT*  Clinical Data: Status post internal fixation of left femoral fracture.  PORTABLE LEFT HIP - 1 VIEW  Comparison: None.  Findings: Cross-table lateral views of the left hip demonstrate successful placement of an intramedullary rod and screw within the left femur.  Alignment is grossly anatomic.  No new fractures are seen.  The soft tissues are not well assessed on these  cross-table lateral views.  IMPRESSION: Successful internal fixation of left femoral intertrochanteric fracture, in grossly anatomic alignment.   Original Report Authenticated By: Tonia Ghent, M.D.    Dg C-arm [phone] Min  10/15/2012  *RADIOLOGY REPORT*  Clinical Data: Left femur ORIF  DG C-ARM 61-120 MIN,LEFT FEMUR - 2 VIEW  Technique: Four fluoroscopic spot views  Comparison:  None.  Findings: Four fluoroscopic spot views demonstrate intramedullary nail fixation of a left intertrochanteric fracture.  No complication.  IMPRESSION: No complication following ORIF left intertrochanteric femur fracture.   Original Report Authenticated By: Genevive Bi, M.D.     Microbiology: Recent Results (from the past 240 hour(s))  URINE CULTURE     Status: None   Collection Time    10/13/12 11:47 AM      Result Value Range Status   Specimen Description URINE, CATHETERIZED   Final   Special Requests NONE   Final   Culture  Setup Time 10/13/2012 13:20   Final   Colony Count NO GROWTH   Final   Culture NO GROWTH   Final   Report Status 10/14/2012 FINAL   Final     Labs: Basic Metabolic Panel:  Recent Labs Lab 10/13/12 1155 10/13/12 2100 10/14/12 0500 10/15/12 0635 10/16/12 0535  NA 140  --  138 141 139  K 3.7  --  3.8 3.8 4.0  CL 100  --  105 107 105  CO2 27  --  26 25 26   GLUCOSE 117*  --  112* 158* 126*  BUN 25*  --  25* 31* 25*  CREATININE 0.75 0.76 0.74 0.78 0.60  CALCIUM 10.0  --  8.5 7.9* 8.2*   Liver Function Tests: No results found for this basename: AST, ALT, ALKPHOS, BILITOT, PROT, ALBUMIN,  in the last 168 hours No results found for this basename: LIPASE, AMYLASE,  in the last 168 hours No results found for this basename: AMMONIA,  in the last 168 hours CBC:  Recent Labs Lab 10/13/12 1155 10/13/12 2100 10/14/12 0500 10/15/12 0635 10/16/12 0535 10/17/12 0544  WBC 4.1 6.1 5.5 11.1* 6.9 6.0  NEUTROABS 3.1  --   --   --   --   --   HGB 12.7 9.7* 9.5* 8.1* 9.1* 9.2*  HCT  38.8 30.0* 28.9* 25.6* 27.2* 28.1*  MCV 83.6 84.0 82.8 85.6 83.2 84.9  PLT 176 143* 144* 158 131* 151   Cardiac Enzymes:  Recent Labs Lab 10/13/12 1155 10/13/12 1547 10/13/12 2100 10/14/12 0500  TROPONINI <0.30 <0.30 <0.30 <0.30   BNP: BNP (last 3 results) No results found for this basename: PROBNP,  in the last 8760 hours CBG:  Recent Labs Lab 10/17/12 1153 10/17/12 1658 10/17/12 2145 10/18/12 0505 10/18/12 0733  GLUCAP 116* 121* 158* 112* 102*       Signed:  Rhetta Mura  [facility] 10/18/2012, 8:31 AM
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
## 2013-02-03 ENCOUNTER — Other Ambulatory Visit: Payer: Self-pay | Admitting: Geriatric Medicine

## 2013-02-03 DIAGNOSIS — R269 Unspecified abnormalities of gait and mobility: Secondary | ICD-10-CM

## 2013-02-12 ENCOUNTER — Ambulatory Visit
Admission: RE | Admit: 2013-02-12 | Discharge: 2013-02-12 | Disposition: A | Discharge status: Home / Self Care | Payer: Medicare Other | Source: Ambulatory Visit | Attending: Geriatric Medicine | Admitting: Geriatric Medicine

## 2013-02-12 DIAGNOSIS — R269 Unspecified abnormalities of gait and mobility: Secondary | ICD-10-CM

## 2013-02-21 ENCOUNTER — Observation Stay (HOSPITAL_COMMUNITY)
Admission: EM | Admit: 2013-02-21 | Discharge: 2013-02-25 | Disposition: A | Discharge status: Skilled Nursing Facility | Payer: Medicare Other | Attending: Internal Medicine | Admitting: Internal Medicine

## 2013-02-21 ENCOUNTER — Observation Stay (HOSPITAL_COMMUNITY): Payer: Medicare Other

## 2013-02-21 ENCOUNTER — Emergency Department (HOSPITAL_COMMUNITY): Payer: Medicare Other

## 2013-02-21 ENCOUNTER — Encounter (HOSPITAL_COMMUNITY): Payer: Self-pay | Admitting: Internal Medicine

## 2013-02-21 DIAGNOSIS — D696 Thrombocytopenia, unspecified: Secondary | ICD-10-CM | POA: Insufficient documentation

## 2013-02-21 DIAGNOSIS — E785 Hyperlipidemia, unspecified: Secondary | ICD-10-CM | POA: Insufficient documentation

## 2013-02-21 DIAGNOSIS — S72002A Fracture of unspecified part of neck of left femur, initial encounter for closed fracture: Secondary | ICD-10-CM

## 2013-02-21 DIAGNOSIS — M25519 Pain in unspecified shoulder: Secondary | ICD-10-CM | POA: Insufficient documentation

## 2013-02-21 DIAGNOSIS — R5381 Other malaise: Secondary | ICD-10-CM | POA: Insufficient documentation

## 2013-02-21 DIAGNOSIS — R55 Syncope and collapse: Principal | ICD-10-CM | POA: Insufficient documentation

## 2013-02-21 DIAGNOSIS — F3289 Other specified depressive episodes: Secondary | ICD-10-CM | POA: Insufficient documentation

## 2013-02-21 DIAGNOSIS — Z79899 Other long term (current) drug therapy: Secondary | ICD-10-CM | POA: Insufficient documentation

## 2013-02-21 DIAGNOSIS — Z8673 Personal history of transient ischemic attack (TIA), and cerebral infarction without residual deficits: Secondary | ICD-10-CM

## 2013-02-21 DIAGNOSIS — G9340 Encephalopathy, unspecified: Secondary | ICD-10-CM | POA: Insufficient documentation

## 2013-02-21 DIAGNOSIS — E876 Hypokalemia: Secondary | ICD-10-CM | POA: Insufficient documentation

## 2013-02-21 DIAGNOSIS — E119 Type 2 diabetes mellitus without complications: Secondary | ICD-10-CM | POA: Insufficient documentation

## 2013-02-21 DIAGNOSIS — M1990 Unspecified osteoarthritis, unspecified site: Secondary | ICD-10-CM | POA: Insufficient documentation

## 2013-02-21 DIAGNOSIS — I10 Essential (primary) hypertension: Secondary | ICD-10-CM | POA: Insufficient documentation

## 2013-02-21 DIAGNOSIS — Y92009 Unspecified place in unspecified non-institutional (private) residence as the place of occurrence of the external cause: Secondary | ICD-10-CM | POA: Insufficient documentation

## 2013-02-21 DIAGNOSIS — S0083XA Contusion of other part of head, initial encounter: Secondary | ICD-10-CM | POA: Insufficient documentation

## 2013-02-21 DIAGNOSIS — F329 Major depressive disorder, single episode, unspecified: Secondary | ICD-10-CM | POA: Insufficient documentation

## 2013-02-21 DIAGNOSIS — W19XXXA Unspecified fall, initial encounter: Secondary | ICD-10-CM | POA: Insufficient documentation

## 2013-02-21 DIAGNOSIS — Z7901 Long term (current) use of anticoagulants: Secondary | ICD-10-CM | POA: Insufficient documentation

## 2013-02-21 DIAGNOSIS — S32309A Unspecified fracture of unspecified ilium, initial encounter for closed fracture: Secondary | ICD-10-CM | POA: Insufficient documentation

## 2013-02-21 DIAGNOSIS — S0003XA Contusion of scalp, initial encounter: Secondary | ICD-10-CM | POA: Insufficient documentation

## 2013-02-21 HISTORY — DX: Pure hypercholesterolemia, unspecified: E78.00

## 2013-02-21 HISTORY — DX: Type 2 diabetes mellitus without complications: E11.9

## 2013-02-21 HISTORY — DX: Major depressive disorder, single episode, unspecified: F32.9

## 2013-02-21 HISTORY — DX: Depression, unspecified: F32.A

## 2013-02-21 LAB — COMPREHENSIVE METABOLIC PANEL
ALT: 17 U/L (ref 0–35)
AST: 30 U/L (ref 0–37)
Albumin: 3.8 g/dL (ref 3.5–5.2)
CO2: 30 mEq/L (ref 19–32)
Calcium: 9.6 mg/dL (ref 8.4–10.5)
GFR calc non Af Amer: 81 mL/min — ABNORMAL LOW (ref >90)
Sodium: 141 mEq/L (ref 135–145)

## 2013-02-21 LAB — PROTIME-INR: Prothrombin Time: 13.5 seconds (ref 11.6–15.2)

## 2013-02-21 LAB — CBC WITH DIFFERENTIAL/PLATELET
Basophils Absolute: 0 10*3/uL (ref 0.0–0.1)
Basophils Relative: 0 % (ref 0–1)
Eosinophils Relative: 1 % (ref 0–5)
Lymphocytes Relative: 8 % — ABNORMAL LOW (ref 12–46)
MCV: 84.9 fL (ref 78.0–100.0)
Neutro Abs: 7.3 10*3/uL (ref 1.7–7.7)
Platelets: 151 10*3/uL (ref 150–400)
RDW: 16.4 % — ABNORMAL HIGH (ref 11.5–15.5)
WBC: 8.4 10*3/uL (ref 4.0–10.5)

## 2013-02-21 LAB — URINALYSIS, ROUTINE W REFLEX MICROSCOPIC
Glucose, UA: NEGATIVE mg/dL
Hgb urine dipstick: NEGATIVE
Leukocytes, UA: NEGATIVE
Protein, ur: 100 mg/dL — AB
Specific Gravity, Urine: 1.013 (ref 1.005–1.030)
pH: 7.5 (ref 5.0–8.0)

## 2013-02-21 LAB — URINE MICROSCOPIC-ADD ON

## 2013-02-21 MED ORDER — ONDANSETRON HCL 4 MG/2ML IJ SOLN: 4.0000 mg | Freq: Four times a day (QID) | INTRAMUSCULAR | Status: DC | PRN

## 2013-02-21 MED ORDER — INSULIN ASPART 100 UNIT/ML ~~LOC~~ SOLN
0.0000 [IU] | Freq: Every day | SUBCUTANEOUS | Status: DC
  Administered 2013-02-24: 2 [IU] via SUBCUTANEOUS

## 2013-02-21 MED ORDER — ACETAMINOPHEN 650 MG RE SUPP: 650.0000 mg | Freq: Four times a day (QID) | RECTAL | Status: DC | PRN

## 2013-02-21 MED ORDER — ONDANSETRON HCL 4 MG PO TABS: 4.0000 mg | ORAL_TABLET | Freq: Four times a day (QID) | ORAL | Status: DC | PRN

## 2013-02-21 MED ORDER — ENOXAPARIN SODIUM 40 MG/0.4ML ~~LOC~~ SOLN
40.0000 mg | SUBCUTANEOUS | Status: DC
  Administered 2013-02-21: 40 mg via SUBCUTANEOUS
  Filled 2013-02-21 (×2): qty 0.4

## 2013-02-21 MED ORDER — INSULIN ASPART 100 UNIT/ML ~~LOC~~ SOLN
0.0000 [IU] | Freq: Three times a day (TID) | SUBCUTANEOUS | Status: DC
  Administered 2013-02-22 – 2013-02-25 (×3): 1 [IU] via SUBCUTANEOUS

## 2013-02-21 MED ORDER — SIMVASTATIN 40 MG PO TABS
40.0000 mg | ORAL_TABLET | Freq: Every evening | ORAL | Status: DC
  Administered 2013-02-21 – 2013-02-24 (×3): 40 mg via ORAL
  Filled 2013-02-21 (×5): qty 1

## 2013-02-21 MED ORDER — SODIUM CHLORIDE 0.9 % IV SOLN
INTRAVENOUS | Status: AC
  Administered 2013-02-21: 21:00:00 via INTRAVENOUS

## 2013-02-21 MED ORDER — SODIUM CHLORIDE 0.9 % IJ SOLN
3.0000 mL | Freq: Two times a day (BID) | INTRAMUSCULAR | Status: DC
  Administered 2013-02-21 – 2013-02-25 (×7): 3 mL via INTRAVENOUS

## 2013-02-21 MED ORDER — ACETAMINOPHEN 325 MG PO TABS
650.0000 mg | ORAL_TABLET | Freq: Four times a day (QID) | ORAL | Status: DC | PRN
  Administered 2013-02-22 – 2013-02-24 (×2): 650 mg via ORAL
  Filled 2013-02-21 (×2): qty 2

## 2013-02-21 MED ORDER — MIRTAZAPINE 7.5 MG PO TABS
7.5000 mg | ORAL_TABLET | Freq: Every day | ORAL | Status: DC
  Administered 2013-02-22 – 2013-02-24 (×3): 7.5 mg via ORAL
  Filled 2013-02-21 (×6): qty 1

## 2013-02-21 MED ORDER — SENNA-DOCUSATE SODIUM 8.6-50 MG PO TABS: 1.0000 | ORAL_TABLET | Freq: Every day | ORAL | Status: DC

## 2013-02-21 MED ORDER — SENNOSIDES-DOCUSATE SODIUM 8.6-50 MG PO TABS
1.0000 | ORAL_TABLET | Freq: Every day | ORAL | Status: DC
  Administered 2013-02-24: 1 via ORAL
  Filled 2013-02-21 (×5): qty 1

## 2013-02-21 MED ORDER — ASPIRIN 325 MG PO TABS
325.0000 mg | ORAL_TABLET | Freq: Every day | ORAL | Status: DC
  Administered 2013-02-22 – 2013-02-25 (×4): 325 mg via ORAL
  Filled 2013-02-21 (×4): qty 1

## 2013-02-21 MED ORDER — SERTRALINE HCL 100 MG PO TABS
100.0000 mg | ORAL_TABLET | Freq: Every day | ORAL | Status: DC
  Administered 2013-02-22 – 2013-02-24 (×3): 100 mg via ORAL
  Filled 2013-02-21 (×6): qty 1

## 2013-02-21 MED ORDER — HYDRALAZINE HCL 20 MG/ML IJ SOLN: 10.0000 mg | Freq: Four times a day (QID) | INTRAMUSCULAR | Status: DC | PRN

## 2013-02-21 NOTE — H&P (Addendum)
Patient's PCP: Ginette Otto, MD  Chief Complaint: Fall and loss of consciousness  History of Present Illness: Brandy Bailey is a 79 y.o. Caucasian female with history of hypertension, osteoarthritis, diet controlled diabetes, hyperlipidemia, and depression who presents with the above complaints.  Patient is confused and cannot provide any history.  No family at bedside to provide any history.  Attempted to call telephone number provided in the chart, no response.  Most of the history was provided by the ED physician.  Patient had a fall it is unclear how and when the patient had a fall and then lost consciousness.  The story of how she fell seems to vary.  EMS was told patient was changing a light bulb while on a ladder and she fell.  Another story was patient was at the fridge and then passed out.  Hospitalist service was asked to admit the patient for further care and management.  Review of Systems: Could not be obtained due to patient's confusion.  Past Medical History  Diagnosis Date  . Hypertension   . Osteoarthritis   . Diabetes mellitus without complication    Past Surgical History  Procedure Laterality Date  . Intramedullary (im) nail intertrochanteric Left 10/14/2012    Procedure: INTRAMEDULLARY (IM) NAIL INTERTROCHANTRIC;  Surgeon: Eulas Post, MD;  Location: [facility] OR;  Service: Orthopedics;  Laterality: Left;   Family History  Problem Relation Age of Onset  . Family history unknown: Yes  Family history: Patient is unable to tell me her family history due to confusion.  History   Social History  . Marital Status: Widowed    Spouse Name: N/A    Number of Children: N/A  . Years of Education: N/A   Occupational History  . Not on file.   Social History Main Topics  . Smoking status: Never Smoker   . Smokeless tobacco: Not on file  . Alcohol Use: No  . Drug Use: No  . Sexually Active: Not on file   Other Topics Concern  . Not on file   Social History  Narrative  . No narrative on file   Allergies: Review of patient's allergies indicates no known allergies.  Home Meds: Prior to Admission medications   Medication Sig Start Date End Date Taking? Authorizing Provider  Calcium Carbonate-Vitamin D (CALCIUM PLUS VITAMIN D PO) Take 1 tablet by mouth 2 (two) times daily.    Historical Provider, MD  HYDROcodone-acetaminophen (NORCO) 5-325 MG per tablet Take 1-2 tablets by mouth every 6 (six) hours as needed for pain. MAXIMUM TOTAL ACETAMINOPHEN DOSE IS 4000 MG PER DAY 10/18/12   Rhetta Mura, MD  mirtazapine (REMERON) 15 MG tablet Take 7.5 mg by mouth at bedtime.    Historical Provider, MD  sennosides-docusate sodium (SENOKOT-S) 8.6-50 MG tablet Take 1 tablet by mouth daily. 10/18/12   Rhetta Mura, MD  sertraline (ZOLOFT) 100 MG tablet Take 100 mg by mouth at bedtime.    Historical Provider, MD  simvastatin (ZOCOR) 40 MG tablet Take 40 mg by mouth every evening.    Historical Provider, MD  warfarin (COUMADIN) 2 MG tablet Take 1 tablet (2 mg total) by mouth daily. 10/18/12   Rhetta Mura, MD    Physical Exam: Blood pressure 185/72, pulse 77, temperature 97.6 F (36.4 C), temperature source Oral, resp. rate 21, SpO2 94.00%. General: Awake, Not oriented x3, No acute distress, mumbles at times, can not answer my questions, had her cloths off prior to my arrival in the room. HEENT: EOMI,  slightly dry mucous membranes, scalp hematoma. Neck: Supple CV: S1 and S2 Lungs: Clear to ascultation bilaterally Abdomen: Soft, Nontender, Nondistended, +bowel sounds. Ext: Good pulses. Trace edema. No clubbing or cyanosis noted. Neuro: Could not be assessed due to patient's compliance. Moving all her extremities spontaneously.  Lab results:  Recent Labs  02/21/13 1654  NA 141  K 3.8  CL 103  CO2 30  GLUCOSE 110*  BUN 16  CREATININE 0.61  CALCIUM 9.6    Recent Labs  02/21/13 1654  AST 30  ALT 17  ALKPHOS 100  BILITOT 0.6   PROT 7.6  ALBUMIN 3.8   No results found for this basename: LIPASE, AMYLASE,  in the last 72 hours  Recent Labs  02/21/13 1654  WBC 8.4  NEUTROABS 7.3  HGB 12.6  HCT 40.4  MCV 84.9  PLT 151    Recent Labs  02/21/13 1654  TROPONINI <0.30   No components found with this basename: POCBNP,  No results found for this basename: DDIMER,  in the last 72 hours No results found for this basename: HGBA1C,  in the last 72 hours No results found for this basename: CHOL, HDL, LDLCALC, TRIG, CHOLHDL, LDLDIRECT,  in the last 72 hours No results found for this basename: TSH, T4TOTAL, FREET3, T3FREE, THYROIDAB,  in the last 72 hours No results found for this basename: VITAMINB12, FOLATE, FERRITIN, TIBC, IRON, RETICCTPCT,  in the last 72 hours Imaging results:  Dg Chest 2 View  02/21/2013   *RADIOLOGY REPORT*  Clinical Data: Syncope  CHEST - 2 VIEW  Comparison: 10/13/2012  Findings: Large hiatal hernia with an air-fluid level is stable. Lungs remain hyperaerated, a feature of COPD. Left basilar subsegmental atelectasis.  No pneumothorax.  No pleural effusion.  IMPRESSION: Left basilar atelectasis and hiatal hernia with chronic changes related to COPD.   Original Report Authenticated By: Jolaine Click, M.D.   Ct Head Wo Contrast  02/21/2013   *RADIOLOGY REPORT*  Clinical Data:  Syncopal episode with posterior scalp hematoma.  CT HEAD WITHOUT CONTRAST CT CERVICAL SPINE WITHOUT CONTRAST  Technique:  Multidetector CT imaging of the head and cervical spine was performed following the standard protocol without intravenous contrast.  Multiplanar CT image reconstructions of the cervical spine were also generated.  Comparison:  10/13/2012 head CT.  CT HEAD  Findings:  Calvarium: Left parietal scalp contusion, no underlying fracture. No lytic or blastic lesion.  Orbits: Bilateral cataract resection.  Brain: No evidence of acute abnormality, such as acute infarction, hemorrhage, hydrocephalus, or mass lesion/mass  effect.Unchanged pattern of patchy bilateral deep cerebral white matter low attenuation, consist with chronic small vessel ischemia.  IMPRESSION: 1.  No acute intracranial abnormality.  2.  Left parietal scalp contusion without fracture.  CT CERVICAL SPINE  Findings: No acute fracture detected.   Diagnostic sensitivity moderately decreased due to patient motion, requiring repeat imaging.  Lucency through the upper right C6 articular process is corticated, possibly from remote fracture.  Subluxation at C4-5 (2 mm) and C5-6 (4 mm) may be explained by advanced facet osteoarthritis.  No surrounding edematous changes to suggest acute subluxation.  No interspinous widening. No evidence of gross canal hematoma.  Osteopenia.  Diffuse degenerative disc space narrowing, most advanced at C3-4 and C6-7.    No advanced osseous spinal canal or foraminal stenosis, with the most notable canal narrowing at C4-5, where a calcified disc material effaces the ventral thecal sac. Despite bulky spurring of the facet and uncovertebral joints, no advanced more focally  notable foraminal stenosis.  Bulky cervical carotid atherosclerotic bifurcation calcification. Heterogeneous thyroid with dominant 8 mm left upper pole nodule.  IMPRESSION:  1.  No evidence of acute cervical spine fracture. Sensitivity moderately decreased by motion. 2.  C4-5 and C5-6 anterolisthesis, likely accounted for by advanced facet osteoarthritis.   Original Report Authenticated By: Tiburcio Pea   Ct Cervical Spine Wo Contrast  02/21/2013   *RADIOLOGY REPORT*  Clinical Data:  Syncopal episode with posterior scalp hematoma.  CT HEAD WITHOUT CONTRAST CT CERVICAL SPINE WITHOUT CONTRAST  Technique:  Multidetector CT imaging of the head and cervical spine was performed following the standard protocol without intravenous contrast.  Multiplanar CT image reconstructions of the cervical spine were also generated.  Comparison:  10/13/2012 head CT.  CT HEAD  Findings:   Calvarium: Left parietal scalp contusion, no underlying fracture. No lytic or blastic lesion.  Orbits: Bilateral cataract resection.  Brain: No evidence of acute abnormality, such as acute infarction, hemorrhage, hydrocephalus, or mass lesion/mass effect.Unchanged pattern of patchy bilateral deep cerebral white matter low attenuation, consist with chronic small vessel ischemia.  IMPRESSION: 1.  No acute intracranial abnormality.  2.  Left parietal scalp contusion without fracture.  CT CERVICAL SPINE  Findings: No acute fracture detected.   Diagnostic sensitivity moderately decreased due to patient motion, requiring repeat imaging.  Lucency through the upper right C6 articular process is corticated, possibly from remote fracture.  Subluxation at C4-5 (2 mm) and C5-6 (4 mm) may be explained by advanced facet osteoarthritis.  No surrounding edematous changes to suggest acute subluxation.  No interspinous widening. No evidence of gross canal hematoma.  Osteopenia.  Diffuse degenerative disc space narrowing, most advanced at C3-4 and C6-7.    No advanced osseous spinal canal or foraminal stenosis, with the most notable canal narrowing at C4-5, where a calcified disc material effaces the ventral thecal sac. Despite bulky spurring of the facet and uncovertebral joints, no advanced more focally notable foraminal stenosis.  Bulky cervical carotid atherosclerotic bifurcation calcification. Heterogeneous thyroid with dominant 8 mm left upper pole nodule.  IMPRESSION:  1.  No evidence of acute cervical spine fracture. Sensitivity moderately decreased by motion. 2.  C4-5 and C5-6 anterolisthesis, likely accounted for by advanced facet osteoarthritis.   Original Report Authenticated By: Tiburcio Pea   Mr Brain Wo Contrast  02/12/2013   *RADIOLOGY REPORT*  Clinical Data: Difficulty walking with weakness and numbness on the left for 2 months.  Remote history of falling.  MRI HEAD WITHOUT CONTRAST  Technique:  Multiplanar,  multiecho pulse sequences of the brain and surrounding structures were obtained according to standard protocol without intravenous contrast.  Comparison: 10/14/2012 MRI.  Findings: There is no evidence for acute infarction, intracranial hemorrhage, mass lesion, hydrocephalus, or extra-axial fluid.  Mild age related atrophy.  Mild to moderate small vessel disease. Prominent perivascular spaces are likely secondary to hypertension. No foci of chronic hemorrhage.  No midline shift.  Flow voids are maintained in the major intracranial vessels.  Normal midline structures.  No worrisome osseous findings.  No acute sinus or mastoid disease.  Bilateral cataract extraction.  Compared with a 10/14/2012, there has been intervening lacunar infarct in the right thalamus ( see image 13 series 7/8 and image 41 series 9).  This does not show restricted diffusion but does demonstrate prolonged T1 and T2 signal.  The MR appearance is consistent with a insult occurring approximately 2 months earlier as correlated with the history.  IMPRESSION: Stable atrophy and chronic microvascular  ischemic change.  No acute intracranial findings.  There is intervening development of a right thalamic lacunar infarct which was not present in March;  this insult would be consistent with an event occurring 2 months ago.   Original Report Authenticated By: Davonna Belling, M.D.   Other results: EKG: Sinus rhythm with HR 89 with PACs, unchanged from previous EKG.  Assessment & Plan by Problem: Syncope and collapse Etiology unclear. Head CT and Cervical spine CT showed no acute abnormality. Chest x-ray and urine analysis shows no clear infectious etiology.  Has had an MRI of her brain on 02/12/2013 which showed stable atrophy and chronic microvascular ischemic change, no acute intracranial finding.  The MRI also showed an interval development of right thalamic lacunar infarct which was not present since March.  Admit the patient to telemetry, cycle  cardiac enzymes to rule the patient for acute coronary syndrome.  Will attempt orthostatic vitals possible, gently hydrate the patient on IV fluids.  Acute encephalopathy/altered mental status/acute delirium Etiology unclear.  Patient's baseline mental status unknown.  Patient's MRI of the brain indicates chronic microvascular ischemic changes, may have vascular dementia at baseline.  Suspect patient may have declined since March as her primary care physician ordered an MRI in 02/12/2013 when last MRI of the brain was done on 10/14/2012.  Will need to discuss with the family identified patient's baseline mental status to see if patient wants further evaluation in the morning if mentation does not.  Imaging and urine analysis was negative for any acute change or infectious etiology.  Uncertain if she has a concussion from the fall causing confusion.  Due to patient's confusion, will request sitter for safety.   Diabetes Appears to be diet controlled.  Not on any diabetic medications.  Sliding scale insulin, sensitive.  Hypertension Patient is not on any home antihypertensive medications.  When necessary hydralazine for systolic blood pressure greater than 165.  If blood pressure continues to be elevated may consider starting the patient on an antihypertensive medication.  Recent history of CVA as indicated on the MRI on 02/12/2013 Start full dose aspirin.  Generalized weakness Request physical therapy and occupational therapy evaluation the morning.  Prophylaxis Lovenox.  CODE STATUS Presumed full code.  Disposition Admit the patient to telemetry as observation.  Time spent on admission, talking to the patient, and coordinating care was: 45 mins.  Tresean Mattix A, MD 02/21/2013, 8:10 PM

## 2013-02-21 NOTE — ED Provider Notes (Signed)
History    CSN: [id number] Arrival date & time 02/21/13  1514  First MD Initiated Contact with Patient 02/21/13 1629     Chief Complaint  Patient presents with  . Loss of Consciousness   level V caveat due to altered mental status (Consider location/radiation/quality/duration/timing/severity/associated sxs/prior Treatment) Patient is a 79 y.o. female presenting with syncope. The history is provided by the patient.  Loss of Consciousness  patient is confused. She is not able to answer most questions. Patient reportedly was standing at the fridge and then passed out, striking her head. Family member states that since then she's been more confused. Nurse also been told that the patient fell while attempting to put a light bulb in while on the ladder. Patient cannot tell me what happened.  Past Medical History  Diagnosis Date  . Hypertension   . Osteoarthritis   . Diabetes mellitus without complication    Past Surgical History  Procedure Laterality Date  . Intramedullary (im) nail intertrochanteric Left 10/14/2012    Procedure: INTRAMEDULLARY (IM) NAIL INTERTROCHANTRIC;  Surgeon: Eulas Post, MD;  Location: [facility] OR;  Service: Orthopedics;  Laterality: Left;   No family history on file. History  Substance Use Topics  . Smoking status: Never Smoker   . Smokeless tobacco: Not on file  . Alcohol Use: No   OB History   Grav Para Term Preterm Abortions TAB SAB Ect Mult Living                 Review of Systems  Unable to perform ROS: Dementia  Cardiovascular: Positive for syncope.    Allergies  Review of patient's allergies indicates no known allergies.  Home Medications   Current Outpatient Rx  Name  Route  Sig  Dispense  Refill  . Calcium Carbonate-Vitamin D (CALCIUM PLUS VITAMIN D PO)   Oral   Take 1 tablet by mouth 2 (two) times daily.         Marland Kitchen HYDROcodone-acetaminophen (NORCO) 5-325 MG per tablet   Oral   Take 1-2 tablets by mouth every 6 (six) hours as needed  for pain. MAXIMUM TOTAL ACETAMINOPHEN DOSE IS 4000 MG PER DAY   30 tablet   0   . mirtazapine (REMERON) 15 MG tablet   Oral   Take 7.5 mg by mouth at bedtime.         . sennosides-docusate sodium (SENOKOT-S) 8.6-50 MG tablet   Oral   Take 1 tablet by mouth daily.   30 tablet   1   . sertraline (ZOLOFT) 100 MG tablet   Oral   Take 100 mg by mouth at bedtime.         . simvastatin (ZOCOR) 40 MG tablet   Oral   Take 40 mg by mouth every evening.         . warfarin (COUMADIN) 2 MG tablet   Oral   Take 1 tablet (2 mg total) by mouth daily.   7 tablet   0     Exact dose to be managed based on protocol by Home ...    BP 164/60  Pulse 65  Temp(Src) 97.6 F (36.4 C) (Oral)  Resp 18  SpO2 98% Physical Exam  Constitutional: She appears well-developed and well-nourished.  HENT:  Hematoma to left scalp area.  Cardiovascular: Normal rate and regular rhythm.   Pulmonary/Chest: Effort normal and breath sounds normal.  Abdominal: Soft. Bowel sounds are normal.  Musculoskeletal: She exhibits no tenderness.  Patient is able to  squeeze hands to command bilaterally.  Neurological:  Patient is somewhat sedate. She will squeeze hands to commands but will not answer questions will have daily. She is not very verbal at this time.  Skin: Skin is warm.    ED Course  Procedures (including critical care time) Labs Reviewed  CBC WITH DIFFERENTIAL - Abnormal; Notable for the following:    RDW 16.4 (*)    Neutrophils Relative % 88 (*)    Lymphocytes Relative 8 (*)    Lymphs Abs 0.6 (*)    All other components within normal limits  COMPREHENSIVE METABOLIC PANEL - Abnormal; Notable for the following:    Glucose, Bld 110 (*)    GFR calc non Af Amer 81 (*)    All other components within normal limits  URINALYSIS, ROUTINE W REFLEX MICROSCOPIC - Abnormal; Notable for the following:    APPearance CLOUDY (*)    Protein, ur 100 (*)    All other components within normal limits   TROPONIN I  URINE MICROSCOPIC-ADD ON   Dg Chest 2 View  02/21/2013   *RADIOLOGY REPORT*  Clinical Data: Syncope  CHEST - 2 VIEW  Comparison: 10/13/2012  Findings: Large hiatal hernia with an air-fluid level is stable. Lungs remain hyperaerated, a feature of COPD. Left basilar subsegmental atelectasis.  No pneumothorax.  No pleural effusion.  IMPRESSION: Left basilar atelectasis and hiatal hernia with chronic changes related to COPD.   Original Report Authenticated By: Jolaine Click, M.D.   Ct Head Wo Contrast  02/21/2013   *RADIOLOGY REPORT*  Clinical Data:  Syncopal episode with posterior scalp hematoma.  CT HEAD WITHOUT CONTRAST CT CERVICAL SPINE WITHOUT CONTRAST  Technique:  Multidetector CT imaging of the head and cervical spine was performed following the standard protocol without intravenous contrast.  Multiplanar CT image reconstructions of the cervical spine were also generated.  Comparison:  10/13/2012 head CT.  CT HEAD  Findings:  Calvarium: Left parietal scalp contusion, no underlying fracture. No lytic or blastic lesion.  Orbits: Bilateral cataract resection.  Brain: No evidence of acute abnormality, such as acute infarction, hemorrhage, hydrocephalus, or mass lesion/mass effect.Unchanged pattern of patchy bilateral deep cerebral white matter low attenuation, consist with chronic small vessel ischemia.  IMPRESSION: 1.  No acute intracranial abnormality.  2.  Left parietal scalp contusion without fracture.  CT CERVICAL SPINE  Findings: No acute fracture detected.   Diagnostic sensitivity moderately decreased due to patient motion, requiring repeat imaging.  Lucency through the upper right C6 articular process is corticated, possibly from remote fracture.  Subluxation at C4-5 (2 mm) and C5-6 (4 mm) may be explained by advanced facet osteoarthritis.  No surrounding edematous changes to suggest acute subluxation.  No interspinous widening. No evidence of gross canal hematoma.  Osteopenia.  Diffuse  degenerative disc space narrowing, most advanced at C3-4 and C6-7.    No advanced osseous spinal canal or foraminal stenosis, with the most notable canal narrowing at C4-5, where a calcified disc material effaces the ventral thecal sac. Despite bulky spurring of the facet and uncovertebral joints, no advanced more focally notable foraminal stenosis.  Bulky cervical carotid atherosclerotic bifurcation calcification. Heterogeneous thyroid with dominant 8 mm left upper pole nodule.  IMPRESSION:  1.  No evidence of acute cervical spine fracture. Sensitivity moderately decreased by motion. 2.  C4-5 and C5-6 anterolisthesis, likely accounted for by advanced facet osteoarthritis.   Original Report Authenticated By: Tiburcio Pea   Ct Cervical Spine Wo Contrast  02/21/2013   *RADIOLOGY REPORT*  Clinical Data:  Syncopal episode with posterior scalp hematoma.  CT HEAD WITHOUT CONTRAST CT CERVICAL SPINE WITHOUT CONTRAST  Technique:  Multidetector CT imaging of the head and cervical spine was performed following the standard protocol without intravenous contrast.  Multiplanar CT image reconstructions of the cervical spine were also generated.  Comparison:  10/13/2012 head CT.  CT HEAD  Findings:  Calvarium: Left parietal scalp contusion, no underlying fracture. No lytic or blastic lesion.  Orbits: Bilateral cataract resection.  Brain: No evidence of acute abnormality, such as acute infarction, hemorrhage, hydrocephalus, or mass lesion/mass effect.Unchanged pattern of patchy bilateral deep cerebral white matter low attenuation, consist with chronic small vessel ischemia.  IMPRESSION: 1.  No acute intracranial abnormality.  2.  Left parietal scalp contusion without fracture.  CT CERVICAL SPINE  Findings: No acute fracture detected.   Diagnostic sensitivity moderately decreased due to patient motion, requiring repeat imaging.  Lucency through the upper right C6 articular process is corticated, possibly from remote fracture.   Subluxation at C4-5 (2 mm) and C5-6 (4 mm) may be explained by advanced facet osteoarthritis.  No surrounding edematous changes to suggest acute subluxation.  No interspinous widening. No evidence of gross canal hematoma.  Osteopenia.  Diffuse degenerative disc space narrowing, most advanced at C3-4 and C6-7.    No advanced osseous spinal canal or foraminal stenosis, with the most notable canal narrowing at C4-5, where a calcified disc material effaces the ventral thecal sac. Despite bulky spurring of the facet and uncovertebral joints, no advanced more focally notable foraminal stenosis.  Bulky cervical carotid atherosclerotic bifurcation calcification. Heterogeneous thyroid with dominant 8 mm left upper pole nodule.  IMPRESSION:  1.  No evidence of acute cervical spine fracture. Sensitivity moderately decreased by motion. 2.  C4-5 and C5-6 anterolisthesis, likely accounted for by advanced facet osteoarthritis.   Original Report Authenticated By: Tiburcio Pea   No diagnosis found.  Date: 02/22/2013  Rate: 89  Rhythm: normal sinus rhythm and premature atrial contractions (PAC)  QRS Axis: normal  Intervals: normal  ST/T Wave abnormalities: nonspecific T wave changes  Conduction Disutrbances:none  Narrative Interpretation:   Old EKG Reviewed: unchanged   MDM  Patient with fall and unclear story. Reported syncope. Lab work is overall reassuring. Head CT is also reassuring. Will be admitted to internal medicine.  Juliet Rude. Rubin Payor, MD 02/22/13 1610

## 2013-02-21 NOTE — ED Notes (Addendum)
Pt lethargic, reports "being tired." Pt follows commands but unable to assess if pt alert and oriented due to pt mumbling No family at bedside at this time.. Pt on monitor. VSS

## 2013-02-21 NOTE — ED Notes (Signed)
Unable to complete orthostatic vital signs at this time due to pts confusion

## 2013-02-21 NOTE — ED Notes (Signed)
Patient presents to ED via [facility]. Pt c/o of a syncopal episode while changing a light bulb. Pt states that she is unsure if she had loc with fall. Pt has a hematoma on back of head, denies any tenderness to head. Pt c/o of dizziness at this time. Denies any other pain at this time. Pt A&O x4 upon arrival to ED.

## 2013-02-21 NOTE — ED Notes (Signed)
Admitting MD at bedside.

## 2013-02-22 ENCOUNTER — Observation Stay (HOSPITAL_COMMUNITY): Payer: Medicare Other

## 2013-02-22 ENCOUNTER — Encounter (HOSPITAL_COMMUNITY): Payer: Self-pay | Admitting: General Practice

## 2013-02-22 DIAGNOSIS — Z8673 Personal history of transient ischemic attack (TIA), and cerebral infarction without residual deficits: Secondary | ICD-10-CM

## 2013-02-22 DIAGNOSIS — I10 Essential (primary) hypertension: Secondary | ICD-10-CM

## 2013-02-22 DIAGNOSIS — Y92009 Unspecified place in unspecified non-institutional (private) residence as the place of occurrence of the external cause: Secondary | ICD-10-CM

## 2013-02-22 DIAGNOSIS — W19XXXA Unspecified fall, initial encounter: Secondary | ICD-10-CM

## 2013-02-22 LAB — CBC
HCT: 36.4 % (ref 36.0–46.0)
MCHC: 31.9 g/dL (ref 30.0–36.0)
Platelets: 145 10*3/uL — ABNORMAL LOW (ref 150–400)
RDW: 16.7 % — ABNORMAL HIGH (ref 11.5–15.5)

## 2013-02-22 LAB — HEMOGLOBIN A1C
Hgb A1c MFr Bld: 5.2 % (ref <5.7)
Mean Plasma Glucose: 103 mg/dL (ref <117)

## 2013-02-22 LAB — BASIC METABOLIC PANEL
BUN: 15 mg/dL (ref 6–23)
Calcium: 8.9 mg/dL (ref 8.4–10.5)
Chloride: 100 mEq/L (ref 96–112)
Creatinine, Ser: 0.63 mg/dL (ref 0.50–1.10)
GFR calc Af Amer: >90 mL/min (ref >90)

## 2013-02-22 LAB — GLUCOSE, CAPILLARY
Glucose-Capillary: 131 mg/dL — ABNORMAL HIGH (ref 70–99)
Glucose-Capillary: 119 mg/dL — ABNORMAL HIGH (ref 70–99)

## 2013-02-22 MED ORDER — POTASSIUM CHLORIDE CRYS ER 20 MEQ PO TBCR
40.0000 meq | EXTENDED_RELEASE_TABLET | Freq: Two times a day (BID) | ORAL | Status: AC
  Administered 2013-02-22 (×2): 40 meq via ORAL
  Filled 2013-02-22 (×2): qty 2

## 2013-02-22 NOTE — Progress Notes (Signed)
Pt seen for OT eval.  See above note.  Pt c/o severe pain with mobility of L shoulder.  Pt fell on this side when she fell and there is a visible bruise to this shoulder.  Pt may need Xray to rule out fx. Tory Emerald, [place] [phone]

## 2013-02-22 NOTE — Consult Note (Signed)
Reason for Consult:left iliac wing fracture and left shoulder pain s/p fall Referring Physician: Kathlen Mody, MD   Brandy Bailey is an 79 y.o. female.  HPI: Brandy Bailey is a 79 y.o. Caucasian female with history of hypertension, osteoarthritis, diet controlled diabetes, hyperlipidemia, and depression who was admitted to [facility] s/p dizziness possible LOC and a fall onto her left side. History is provided by patient today.  She states she was on a step ladder changing a light bulb/cleaning her ceiling fan in the kitchen when she looked up she became very dizzy/disoriented possible LOC then fell to the floor landing on her left side, hitting her head.  She was seen in the ED then hospitalist service was asked to admit the patient for further care and management.  Patient with continued c/o left hip and shoulder pain ortho consulted for evaluation and weight bearing recommendations.  Denies CP, SOB, abdominal pain, N/V.  Denies neck pain or pain in right upper or lower extremities.   Past Medical History  Diagnosis Date  . Hypertension   . Osteoarthritis   . Diabetes mellitus without complication     Past Surgical History  Procedure Laterality Date  . Intramedullary (im) nail intertrochanteric Left 10/14/2012    Procedure: INTRAMEDULLARY (IM) NAIL INTERTROCHANTRIC;  Surgeon: Eulas Post, MD;  Location: [facility] OR;  Service: Orthopedics;  Laterality: Left;    Family History  Problem Relation Age of Onset  . Family history unknown: Yes    Social History:  reports that she has never smoked. She does not have any smokeless tobacco history on file. She reports that she does not drink alcohol or use illicit drugs.  Allergies: No Known Allergies  Medications: I have reviewed the patient's current medications.  Results for orders placed during the hospital encounter of 02/21/13 (from the past 48 hour(s))  CBC WITH DIFFERENTIAL     Status: Abnormal   Collection Time    02/21/13  4:54 PM   Result Value Range   WBC 8.4  4.0 - 10.5 K/uL   RBC 4.76  3.87 - 5.11 MIL/uL   Hemoglobin 12.6  12.0 - 15.0 g/dL   HCT 40.9  81.1 - 91.4 %   MCV 84.9  78.0 - 100.0 fL   MCH 26.5  26.0 - 34.0 pg   MCHC 31.2  30.0 - 36.0 g/dL   RDW 78.2 (*) 95.6 - 21.3 %   Platelets 151  150 - 400 K/uL   Neutrophils Relative % 88 (*) 43 - 77 %   Neutro Abs 7.3  1.7 - 7.7 K/uL   Lymphocytes Relative 8 (*) 12 - 46 %   Lymphs Abs 0.6 (*) 0.7 - 4.0 K/uL   Monocytes Relative 4  3 - 12 %   Monocytes Absolute 0.3  0.1 - 1.0 K/uL   Eosinophils Relative 1  0 - 5 %   Eosinophils Absolute 0.0  0.0 - 0.7 K/uL   Basophils Relative 0  0 - 1 %   Basophils Absolute 0.0  0.0 - 0.1 K/uL  COMPREHENSIVE METABOLIC PANEL     Status: Abnormal   Collection Time    02/21/13  4:54 PM      Result Value Range   Sodium 141  135 - 145 mEq/L   Potassium 3.8  3.5 - 5.1 mEq/L   Chloride 103  96 - 112 mEq/L   CO2 30  19 - 32 mEq/L   Glucose, Bld 110 (*) 70 - 99 mg/dL  BUN 16  6 - 23 mg/dL   Creatinine, Ser 1.61  0.50 - 1.10 mg/dL   Calcium 9.6  8.4 - 09.6 mg/dL   Total Protein 7.6  6.0 - 8.3 g/dL   Albumin 3.8  3.5 - 5.2 g/dL   AST 30  0 - 37 U/L   ALT 17  0 - 35 U/L   Alkaline Phosphatase 100  39 - 117 U/L   Total Bilirubin 0.6  0.3 - 1.2 mg/dL   GFR calc non Af Amer 81 (*) >90 mL/min   GFR calc Af Amer >90  >90 mL/min   Comment:            The eGFR has been calculated     using the CKD EPI equation.     This calculation has not been     validated in all clinical     situations.     eGFR's persistently     <90 mL/min signify     possible Chronic Kidney Disease.  TROPONIN I     Status: None   Collection Time    02/21/13  4:54 PM      Result Value Range   Troponin I <0.30  <0.30 ng/mL   Comment:            Due to the release kinetics of cTnI,     a negative result within the first hours     of the onset of symptoms does not rule out     myocardial infarction with certainty.     If myocardial infarction is  still suspected,     repeat the test at appropriate intervals.  URINALYSIS, ROUTINE W REFLEX MICROSCOPIC     Status: Abnormal   Collection Time    02/21/13  6:14 PM      Result Value Range   Color, Urine YELLOW  YELLOW   APPearance CLOUDY (*) CLEAR   Specific Gravity, Urine 1.013  1.005 - 1.030   pH 7.5  5.0 - 8.0   Glucose, UA NEGATIVE  NEGATIVE mg/dL   Hgb urine dipstick NEGATIVE  NEGATIVE   Bilirubin Urine NEGATIVE  NEGATIVE   Ketones, ur NEGATIVE  NEGATIVE mg/dL   Protein, ur 045 (*) NEGATIVE mg/dL   Urobilinogen, UA 0.2  0.0 - 1.0 mg/dL   Nitrite NEGATIVE  NEGATIVE   Leukocytes, UA NEGATIVE  NEGATIVE  URINE MICROSCOPIC-ADD ON     Status: None   Collection Time    02/21/13  6:14 PM      Result Value Range   Squamous Epithelial / LPF RARE  RARE   RBC / HPF 0-2  <3 RBC/hpf   Bacteria, UA RARE  RARE   Urine-Other AMORPHOUS URATES/PHOSPHATES    PROTIME-INR     Status: None   Collection Time    02/21/13  8:11 PM      Result Value Range   Prothrombin Time 13.5  11.6 - 15.2 seconds   INR 1.05  0.00 - 1.49  GLUCOSE, CAPILLARY     Status: Abnormal   Collection Time    02/21/13 10:03 PM      Result Value Range   Glucose-Capillary 172 (*) 70 - 99 mg/dL  TROPONIN I     Status: None   Collection Time    02/21/13 10:57 PM      Result Value Range   Troponin I <0.30  <0.30 ng/mL   Comment:  Due to the release kinetics of cTnI,     a negative result within the first hours     of the onset of symptoms does not rule out     myocardial infarction with certainty.     If myocardial infarction is still suspected,     repeat the test at appropriate intervals.  TROPONIN I     Status: None   Collection Time    02/22/13  5:40 AM      Result Value Range   Troponin I <0.30  <0.30 ng/mL   Comment:            Due to the release kinetics of cTnI,     a negative result within the first hours     of the onset of symptoms does not rule out     myocardial infarction with  certainty.     If myocardial infarction is still suspected,     repeat the test at appropriate intervals.  BASIC METABOLIC PANEL     Status: Abnormal   Collection Time    02/22/13  5:40 AM      Result Value Range   Sodium 137  135 - 145 mEq/L   Potassium 3.4 (*) 3.5 - 5.1 mEq/L   Chloride 100  96 - 112 mEq/L   CO2 29  19 - 32 mEq/L   Glucose, Bld 141 (*) 70 - 99 mg/dL   BUN 15  6 - 23 mg/dL   Creatinine, Ser 8.29  0.50 - 1.10 mg/dL   Calcium 8.9  8.4 - 56.2 mg/dL   GFR calc non Af Amer 80 (*) >90 mL/min   GFR calc Af Amer >90  >90 mL/min   Comment:            The eGFR has been calculated     using the CKD EPI equation.     This calculation has not been     validated in all clinical     situations.     eGFR's persistently     <90 mL/min signify     possible Chronic Kidney Disease.  CBC     Status: Abnormal   Collection Time    02/22/13  5:40 AM      Result Value Range   WBC 6.5  4.0 - 10.5 K/uL   RBC 4.32  3.87 - 5.11 MIL/uL   Hemoglobin 11.6 (*) 12.0 - 15.0 g/dL   HCT 13.0  86.5 - 78.4 %   MCV 84.3  78.0 - 100.0 fL   MCH 26.9  26.0 - 34.0 pg   MCHC 31.9  30.0 - 36.0 g/dL   RDW 69.6 (*) 29.5 - 28.4 %   Platelets 145 (*) 150 - 400 K/uL  GLUCOSE, CAPILLARY     Status: Abnormal   Collection Time    02/22/13  7:24 AM      Result Value Range   Glucose-Capillary 131 (*) 70 - 99 mg/dL  GLUCOSE, CAPILLARY     Status: Abnormal   Collection Time    02/22/13 12:01 PM      Result Value Range   Glucose-Capillary 128 (*) 70 - 99 mg/dL   Comment 1 Documented in Chart     Comment 2 Notify RN      Dg Chest 2 View  02/21/2013   *RADIOLOGY REPORT*  Clinical Data: Syncope  CHEST - 2 VIEW  Comparison: 10/13/2012  Findings: Large hiatal hernia with an air-fluid level is stable. Lungs remain  hyperaerated, a feature of COPD. Left basilar subsegmental atelectasis.  No pneumothorax.  No pleural effusion.  IMPRESSION: Left basilar atelectasis and hiatal hernia with chronic changes related  to COPD.   Original Report Authenticated By: Jolaine Click, M.D.   Dg Hip Complete Left  02/22/2013   *RADIOLOGY REPORT*  Clinical Data: Fall and left hip pain.  LEFT HIP - COMPLETE 2+ VIEW  Comparison: 10/14/2012  Findings:  There is irregularity of the left iliac wing suggesting a displaced fracture.  Mild irregularity of the left superior pubic ramus without definite fracture.  Again noted are heterotopic ossifications just superior to the right greater trochanter.  Again noted is intramedullary rod fixation of the left femur with a screw extending through the left femoral head and neck.  There is callus and remodeling of the left femoral trochanters from the previous fracture.  There are severe degenerative changes at the left knee.  IMPRESSION: Probable displaced fracture involving the left iliac wing. Recommend further characterization with CT.  Internal fixation of the left proximal femur fracture with callus formation and remodeling.  No acute fracture in the left femur.   Original Report Authenticated By: Richarda Overlie, M.D.   Ct Head Wo Contrast  02/21/2013   *RADIOLOGY REPORT*  Clinical Data:  Syncopal episode with posterior scalp hematoma.  CT HEAD WITHOUT CONTRAST CT CERVICAL SPINE WITHOUT CONTRAST  Technique:  Multidetector CT imaging of the head and cervical spine was performed following the standard protocol without intravenous contrast.  Multiplanar CT image reconstructions of the cervical spine were also generated.  Comparison:  10/13/2012 head CT.  CT HEAD  Findings:  Calvarium: Left parietal scalp contusion, no underlying fracture. No lytic or blastic lesion.  Orbits: Bilateral cataract resection.  Brain: No evidence of acute abnormality, such as acute infarction, hemorrhage, hydrocephalus, or mass lesion/mass effect.Unchanged pattern of patchy bilateral deep cerebral white matter low attenuation, consist with chronic small vessel ischemia.  IMPRESSION: 1.  No acute intracranial abnormality.  2.   Left parietal scalp contusion without fracture.  CT CERVICAL SPINE  Findings: No acute fracture detected.   Diagnostic sensitivity moderately decreased due to patient motion, requiring repeat imaging.  Lucency through the upper right C6 articular process is corticated, possibly from remote fracture.  Subluxation at C4-5 (2 mm) and C5-6 (4 mm) may be explained by advanced facet osteoarthritis.  No surrounding edematous changes to suggest acute subluxation.  No interspinous widening. No evidence of gross canal hematoma.  Osteopenia.  Diffuse degenerative disc space narrowing, most advanced at C3-4 and C6-7.    No advanced osseous spinal canal or foraminal stenosis, with the most notable canal narrowing at C4-5, where a calcified disc material effaces the ventral thecal sac. Despite bulky spurring of the facet and uncovertebral joints, no advanced more focally notable foraminal stenosis.  Bulky cervical carotid atherosclerotic bifurcation calcification. Heterogeneous thyroid with dominant 8 mm left upper pole nodule.  IMPRESSION:  1.  No evidence of acute cervical spine fracture. Sensitivity moderately decreased by motion. 2.  C4-5 and C5-6 anterolisthesis, likely accounted for by advanced facet osteoarthritis.   Original Report Authenticated By: Tiburcio Pea   Ct Cervical Spine Wo Contrast  02/21/2013   *RADIOLOGY REPORT*  Clinical Data:  Syncopal episode with posterior scalp hematoma.  CT HEAD WITHOUT CONTRAST CT CERVICAL SPINE WITHOUT CONTRAST  Technique:  Multidetector CT imaging of the head and cervical spine was performed following the standard protocol without intravenous contrast.  Multiplanar CT image reconstructions of the cervical spine  were also generated.  Comparison:  10/13/2012 head CT.  CT HEAD  Findings:  Calvarium: Left parietal scalp contusion, no underlying fracture. No lytic or blastic lesion.  Orbits: Bilateral cataract resection.  Brain: No evidence of acute abnormality, such as acute  infarction, hemorrhage, hydrocephalus, or mass lesion/mass effect.Unchanged pattern of patchy bilateral deep cerebral white matter low attenuation, consist with chronic small vessel ischemia.  IMPRESSION: 1.  No acute intracranial abnormality.  2.  Left parietal scalp contusion without fracture.  CT CERVICAL SPINE  Findings: No acute fracture detected.   Diagnostic sensitivity moderately decreased due to patient motion, requiring repeat imaging.  Lucency through the upper right C6 articular process is corticated, possibly from remote fracture.  Subluxation at C4-5 (2 mm) and C5-6 (4 mm) may be explained by advanced facet osteoarthritis.  No surrounding edematous changes to suggest acute subluxation.  No interspinous widening. No evidence of gross canal hematoma.  Osteopenia.  Diffuse degenerative disc space narrowing, most advanced at C3-4 and C6-7.    No advanced osseous spinal canal or foraminal stenosis, with the most notable canal narrowing at C4-5, where a calcified disc material effaces the ventral thecal sac. Despite bulky spurring of the facet and uncovertebral joints, no advanced more focally notable foraminal stenosis.  Bulky cervical carotid atherosclerotic bifurcation calcification. Heterogeneous thyroid with dominant 8 mm left upper pole nodule.  IMPRESSION:  1.  No evidence of acute cervical spine fracture. Sensitivity moderately decreased by motion. 2.  C4-5 and C5-6 anterolisthesis, likely accounted for by advanced facet osteoarthritis.   Original Report Authenticated By: Tiburcio Pea   Ct Pelvis Wo Contrast  02/22/2013   *RADIOLOGY REPORT*  Clinical Data: Status post fall.  Left hip pain.  CT PELVIS WITHOUT CONTRAST  Technique:  Multidetector CT imaging of the pelvis was performed following the standard protocol without intravenous contrast.  Comparison: Plain films left hip 02/21/2013.  Findings: The patient has an acute fracture of the left iliac wing. The fracture involves the anterior 4.5  cm of the left ilium and extends approximately 13 cm cranial-caudal.  The fracture is mildly comminuted and there is 1.6 cm of medial displacement of the distal fracture fragment.  The fracture does not extend into the left acetabulum.  No other acute fracture is identified.  The patient has a remote left intertrochanteric fracture with fixation hardware in place.  There is also a remote right greater trochanter fracture which is a nonunion.  There is moderate to advanced degenerative change about the hips, worse on the right. The sacroiliac joints and symphysis pubis appear normal.  Soft tissue structures demonstrate hematoma formation about the patient's fracture, worse in the left iliacus muscle.  There is also hematoma deep to the left rectus abdominus muscle.  IMPRESSION:  1.  Acute left iliac wing fracture with associated hematoma formation.  As noted, the fracture does not extend into the left hip. 2.  Remote right greater trochanter and left intertrochanteric fractures.   Original Report Authenticated By: Holley Dexter, M.D.   Dg Shoulder Left  02/22/2013   *RADIOLOGY REPORT*  Clinical Data: Fall, left shoulder pain.  LEFT SHOULDER - 2+ VIEW  Comparison: None.  Findings: Mild degenerative change at the acromioclavicular and glenohumeral joints.  No fracture or dislocation.  Adjacent ribs appear intact.  IMPRESSION: Mild degenerative change.  No acute findings.   Original Report Authenticated By: Davonna Belling, M.D.    ROS Positive ROS: All other systems have been reviewed and were otherwise negative with the  exception of those mentioned in the HPI and as above.  Blood pressure 113/57, pulse 69, temperature 97.2 F (36.2 C), temperature source Oral, resp. rate 18, height 5\' 7"  (1.702 m), weight 49.986 kg (110 lb 3.2 oz), SpO2 100.00%. Physical Exam  Constitutional: She is oriented to person, place, and time. She is cooperative. No distress.  Mildly frail appearing  HENT:  Head: Normocephalic.  Head is without laceration.    Nose: Nose normal.  Contusion/hematoma to left side head, TTP  Eyes: Conjunctivae and EOM are normal. Pupils are equal, round, and reactive to light.  Neck: Normal range of motion. Neck supple. No spinous process tenderness and no muscular tenderness present. Normal range of motion present.  Cardiovascular: Intact distal pulses.   Respiratory: Effort normal. No respiratory distress. She exhibits no tenderness.  GI: Soft. She exhibits no distension. There is no tenderness.  Musculoskeletal:       Left shoulder: She exhibits decreased range of motion, tenderness, bony tenderness, crepitus and pain. She exhibits no deformity, no laceration, no spasm and normal strength.       Left hip: She exhibits bony tenderness. She exhibits normal range of motion, normal strength, no deformity and no laceration.  TTP over left iliac crest  Secondary exam: cervical, right UE and LE nonTTP, good ROM.  Denies calf tenderness to palpation bilat LE, intact Dorsiflexion/plantarflexion ankles, good strength.    Lymphadenopathy:    She has no cervical adenopathy.  Neurological: She is alert and oriented to person, place, and time. No cranial nerve deficit.  Skin: Skin is warm and dry. No rash noted. No erythema.  Psychiatric: She has a normal mood and affect. Her behavior is normal.    Assessment/Plan: 1. Left iliac wing fracture mildly displaced  2. Left shoulder pain/strain underlying DJD  3. Left scalp contusion  Regarding the fracture patient may be weight bearing as tolerated, did recommend walker with hx of multiple falls.  If patient is ambulatory should not require any continued DVT proph although if little mobility may recommend daily ASA vs lovenox for 2 weeks.  Pain control as needed per primary service.  Discussed with pt if she continues to have pain in the hip or shoulder we would be happy to follow up on an outpatient basis.  Thank you for allowing [place] to be part  of her care, let us know if we can be of any further assistance.  Margart Sickles 02/22/2013, 3:29 PM
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
## 2013-02-22 NOTE — Progress Notes (Signed)
UR Completed Kaleyah Labreck Graves-Bigelow, RN,BSN [phone]  

## 2013-02-22 NOTE — Progress Notes (Signed)
UR Completed Iker Nuttall Graves-Bigelow, RN,BSN [phone]  

## 2013-02-22 NOTE — Progress Notes (Signed)
Clinical Social Work Department CLINICAL SOCIAL WORK PLACEMENT NOTE 02/22/2013  Patient:  Brandy Bailey, Brandy Bailey  Account Number:  [id number] Admit date:  02/21/2013  Clinical Social Worker:  Theresia Bough, Theresia Majors  Date/time:  02/22/2013 11:59 AM  Clinical Social Work is seeking post-discharge placement for this patient at the following level of care:   SKILLED NURSING   (*CSW will update this form in Epic as items are completed)   02/22/2013  Patient/family provided with [facility] Department of Clinical Social Work's list of facilities offering this level of care within the geographic area requested by the patient (or if unable, by the patient's family).  02/22/2013  Patient/family informed of their freedom to choose among providers that offer the needed level of care, that participate in Medicare, Medicaid or managed care program needed by the patient, have an available bed and are willing to accept the patient.  02/22/2013  Patient/family informed of [facility]' ownership interest in [facility], as well as of the fact that they are under no obligation to receive care at this facility.  PASARR submitted to EDS on 02/22/2013 PASARR number received from EDS on   FL2 transmitted to all facilities in geographic area requested by pt/family on  02/22/2013 FL2 transmitted to all facilities within larger geographic area on   Patient informed that his/her managed care company has contracts with or will negotiate with  certain facilities, including the following:     Patient/family informed of bed offers received:   Patient chooses bed at  Physician recommends and patient chooses bed at    Patient to be transferred to  on   Patient to be transferred to facility by   The following physician request were entered in Epic:   Additional Comments:  Theresia Bough, MSW, [employer] [phone]

## 2013-02-23 DIAGNOSIS — S72009A Fracture of unspecified part of neck of unspecified femur, initial encounter for closed fracture: Secondary | ICD-10-CM

## 2013-02-23 DIAGNOSIS — G9340 Encephalopathy, unspecified: Secondary | ICD-10-CM

## 2013-02-23 LAB — CBC
HCT: 31.9 % — ABNORMAL LOW (ref 36.0–46.0)
Hemoglobin: 10.1 g/dL — ABNORMAL LOW (ref 12.0–15.0)
MCH: 27 pg (ref 26.0–34.0)
MCV: 85.3 fL (ref 78.0–100.0)
Platelets: 105 10*3/uL — ABNORMAL LOW (ref 150–400)
RBC: 3.74 MIL/uL — ABNORMAL LOW (ref 3.87–5.11)
WBC: 7.6 10*3/uL (ref 4.0–10.5)

## 2013-02-23 LAB — BASIC METABOLIC PANEL
CO2: 25 mEq/L (ref 19–32)
CO2: 25 mEq/L (ref 19–32)
Calcium: 9 mg/dL (ref 8.4–10.5)
Calcium: 9 mg/dL (ref 8.4–10.5)
Chloride: 104 mEq/L (ref 96–112)
Creatinine, Ser: 0.61 mg/dL (ref 0.50–1.10)
Creatinine, Ser: 0.74 mg/dL (ref 0.50–1.10)
GFR calc non Af Amer: 81 mL/min — ABNORMAL LOW (ref >90)
Glucose, Bld: 125 mg/dL — ABNORMAL HIGH (ref 70–99)
Glucose, Bld: 142 mg/dL — ABNORMAL HIGH (ref 70–99)

## 2013-02-23 LAB — URINE MICROSCOPIC-ADD ON

## 2013-02-23 LAB — URINALYSIS, ROUTINE W REFLEX MICROSCOPIC
Glucose, UA: NEGATIVE mg/dL
Hgb urine dipstick: NEGATIVE
Ketones, ur: NEGATIVE mg/dL
Leukocytes, UA: NEGATIVE
Protein, ur: 100 mg/dL — AB
pH: 5.5 (ref 5.0–8.0)

## 2013-02-23 LAB — GLUCOSE, CAPILLARY
Glucose-Capillary: 111 mg/dL — ABNORMAL HIGH (ref 70–99)
Glucose-Capillary: 121 mg/dL — ABNORMAL HIGH (ref 70–99)

## 2013-02-23 MED ORDER — ENSURE COMPLETE PO LIQD
237.0000 mL | Freq: Two times a day (BID) | ORAL | Status: DC
  Administered 2013-02-23: 237 mL via ORAL

## 2013-02-23 MED ORDER — ADULT MULTIVITAMIN W/MINERALS CH
1.0000 | ORAL_TABLET | Freq: Every day | ORAL | Status: DC
  Administered 2013-02-23 – 2013-02-25 (×3): 1 via ORAL
  Filled 2013-02-23 (×3): qty 1

## 2013-02-23 MED ORDER — SODIUM CHLORIDE 0.9 % IV BOLUS (SEPSIS)
500.0000 mL | Freq: Once | INTRAVENOUS | Status: AC
  Administered 2013-02-23: 500 mL via INTRAVENOUS

## 2013-02-23 MED ORDER — ASPIRIN 325 MG PO TABS: 325.0000 mg | ORAL_TABLET | Freq: Every day | ORAL | Status: DC
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
## 2013-02-23 NOTE — Progress Notes (Signed)
[facility] PROGRESS NOTE  VERNER KOPISCHKE ZOX:[id number] DOB: [birth year] DOA: 02/21/2013 PCP: Ginette Otto, MD  HPI: 79 y.o. Caucasian female with history of hypertension, osteoarthritis, diet controlled diabetes, hyperlipidemia, and depression who presents with the above complaints. Patient is confused and cannot provide any history. No family at bedside to provide any history. Attempted to call telephone number provided in the chart, no response. Most of the history was provided by the ED physician. Patient had a fall it is unclear how and when the patient had a fall and then lost consciousness. The story of how she fell seems to vary. EMS was told patient was changing a light bulb while on a ladder and she fell. Another story was patient was at the fridge and then passed out. Hospitalist service was asked to admit the patient for further care and management.  Assessment/Plan:  Patient was deemed safe for d/c in the morning and her complaints on admissions were significantly improved. Daughter went to pick up her car and patient was outside awaiting a pick up. Daughter did not pick up patient for about an hour and now states that she forgot about her mother and went home to sleep. Patient was brought back to the room and now appears somewhat more confused than this morning.   I did advise the daughter to go to The emergency room to be evaluated herself but she declined.   Confusion - may be related to patient sitting out in the sun. Some concern for slurred speech but I cannot tell a difference when compared to this morning; obtain MRI given confusion.  Dizziness on standing: she denies LOC. Resolved. CE negative. S/p Fall: CT pelvis shows left iliac wing fracture with a hematoma. . She also reports left shoulder pain with ROM. Shoulder x rays ordered.  - conservative management.  Hypokalemia: will be repleted as needed. Normal this morning.  Known diabetic: HBA1C 5.2 H/o CVA:  resume aspirin and simvastatin. Repeat MRI for confusion.  Hypertension: controlled.  Generalized weakness: PT eval.   Code Status: Full Family Communication: daughter  Disposition Plan: to be determined.   Consultants:  none  Procedures:  none  Antibiotics:  Anti-infectives   None     Antibiotics Given (last 72 hours)   None      HPI/Subjective: - somewhat confused in the evening.   Objective: Filed Vitals:   02/22/13 0524 02/22/13 1313 02/22/13 2138 02/23/13 0603  BP: 152/67 113/57 146/73 169/76  Pulse: 81 69 81 88  Temp: 97.9 F (36.6 C) 97.2 F (36.2 C) 99 F (37.2 C) 98.4 F (36.9 C)  TempSrc: Oral Oral Oral Oral  Resp: 20 18 18 18   Height:      Weight:      SpO2: 98% 100% 96% 98%    Intake/Output Summary (Last 24 hours) at 02/23/13 1727 Last data filed at 02/23/13 0900  Gross per 24 hour  Intake    480 ml  Output    750 ml  Net   -270 ml   Filed Weights   02/21/13 2112  Weight: 49.986 kg (110 lb 3.2 oz)    Exam:   General:  NAD  Cardiovascular: regular rate and rhythm, without MRG  Respiratory: good air movement, clear to auscultation throughout, no wheezing  Abdomen: soft, not tender to palpation, positive bowel sounds  MSK: no peripheral edema  Neuro: non focal, not oriented to time.   Data Reviewed: Basic Metabolic Panel:  Recent Labs Lab 02/21/13 1654 02/22/13  0540 02/23/13 0535  NA 141 137 139  K 3.8 3.4* 4.2  CL 103 100 107  CO2 30 29 25   GLUCOSE 110* 141* 125*  BUN 16 15 19   CREATININE 0.61 0.63 0.61  CALCIUM 9.6 8.9 9.0   Liver Function Tests:  Recent Labs Lab 02/21/13 1654  AST 30  ALT 17  ALKPHOS 100  BILITOT 0.6  PROT 7.6  ALBUMIN 3.8   CBC:  Recent Labs Lab 02/21/13 1654 02/22/13 0540  WBC 8.4 6.5  NEUTROABS 7.3  --   HGB 12.6 11.6*  HCT 40.4 36.4  MCV 84.9 84.3  PLT 151 145*   Cardiac Enzymes:  Recent Labs Lab 02/21/13 1654 02/21/13 2257 02/22/13 0540  TROPONINI <0.30 <0.30  <0.30   CBG:  Recent Labs Lab 02/22/13 1201 02/22/13 1657 02/22/13 2137 02/23/13 0747 02/23/13 1151  GLUCAP 128* 105* 119* 125* 111*   Studies: Dg Chest 2 View  02/21/2013   *RADIOLOGY REPORT*  Clinical Data: Syncope  CHEST - 2 VIEW  Comparison: 10/13/2012  Findings: Large hiatal hernia with an air-fluid level is stable. Lungs remain hyperaerated, a feature of COPD. Left basilar subsegmental atelectasis.  No pneumothorax.  No pleural effusion.  IMPRESSION: Left basilar atelectasis and hiatal hernia with chronic changes related to COPD.   Original Report Authenticated By: Jolaine Click, M.D.   Dg Hip Complete Left  02/22/2013   *RADIOLOGY REPORT*  Clinical Data: Fall and left hip pain.  LEFT HIP - COMPLETE 2+ VIEW  Comparison: 10/14/2012  Findings:  There is irregularity of the left iliac wing suggesting a displaced fracture.  Mild irregularity of the left superior pubic ramus without definite fracture.  Again noted are heterotopic ossifications just superior to the right greater trochanter.  Again noted is intramedullary rod fixation of the left femur with a screw extending through the left femoral head and neck.  There is callus and remodeling of the left femoral trochanters from the previous fracture.  There are severe degenerative changes at the left knee.  IMPRESSION: Probable displaced fracture involving the left iliac wing. Recommend further characterization with CT.  Internal fixation of the left proximal femur fracture with callus formation and remodeling.  No acute fracture in the left femur.   Original Report Authenticated By: Richarda Overlie, M.D.   Ct Head Wo Contrast  02/21/2013   *RADIOLOGY REPORT*  Clinical Data:  Syncopal episode with posterior scalp hematoma.  CT HEAD WITHOUT CONTRAST CT CERVICAL SPINE WITHOUT CONTRAST  Technique:  Multidetector CT imaging of the head and cervical spine was performed following the standard protocol without intravenous contrast.  Multiplanar CT image  reconstructions of the cervical spine were also generated.  Comparison:  10/13/2012 head CT.  CT HEAD  Findings:  Calvarium: Left parietal scalp contusion, no underlying fracture. No lytic or blastic lesion.  Orbits: Bilateral cataract resection.  Brain: No evidence of acute abnormality, such as acute infarction, hemorrhage, hydrocephalus, or mass lesion/mass effect.Unchanged pattern of patchy bilateral deep cerebral white matter low attenuation, consist with chronic small vessel ischemia.  IMPRESSION: 1.  No acute intracranial abnormality.  2.  Left parietal scalp contusion without fracture.  CT CERVICAL SPINE  Findings: No acute fracture detected.   Diagnostic sensitivity moderately decreased due to patient motion, requiring repeat imaging.  Lucency through the upper right C6 articular process is corticated, possibly from remote fracture.  Subluxation at C4-5 (2 mm) and C5-6 (4 mm) may be explained by advanced facet osteoarthritis.  No surrounding edematous changes to  suggest acute subluxation.  No interspinous widening. No evidence of gross canal hematoma.  Osteopenia.  Diffuse degenerative disc space narrowing, most advanced at C3-4 and C6-7.    No advanced osseous spinal canal or foraminal stenosis, with the most notable canal narrowing at C4-5, where a calcified disc material effaces the ventral thecal sac. Despite bulky spurring of the facet and uncovertebral joints, no advanced more focally notable foraminal stenosis.  Bulky cervical carotid atherosclerotic bifurcation calcification. Heterogeneous thyroid with dominant 8 mm left upper pole nodule.  IMPRESSION:  1.  No evidence of acute cervical spine fracture. Sensitivity moderately decreased by motion. 2.  C4-5 and C5-6 anterolisthesis, likely accounted for by advanced facet osteoarthritis.   Original Report Authenticated By: Tiburcio Pea   Ct Cervical Spine Wo Contrast  02/21/2013   *RADIOLOGY REPORT*  Clinical Data:  Syncopal episode with posterior  scalp hematoma.  CT HEAD WITHOUT CONTRAST CT CERVICAL SPINE WITHOUT CONTRAST  Technique:  Multidetector CT imaging of the head and cervical spine was performed following the standard protocol without intravenous contrast.  Multiplanar CT image reconstructions of the cervical spine were also generated.  Comparison:  10/13/2012 head CT.  CT HEAD  Findings:  Calvarium: Left parietal scalp contusion, no underlying fracture. No lytic or blastic lesion.  Orbits: Bilateral cataract resection.  Brain: No evidence of acute abnormality, such as acute infarction, hemorrhage, hydrocephalus, or mass lesion/mass effect.Unchanged pattern of patchy bilateral deep cerebral white matter low attenuation, consist with chronic small vessel ischemia.  IMPRESSION: 1.  No acute intracranial abnormality.  2.  Left parietal scalp contusion without fracture.  CT CERVICAL SPINE  Findings: No acute fracture detected.   Diagnostic sensitivity moderately decreased due to patient motion, requiring repeat imaging.  Lucency through the upper right C6 articular process is corticated, possibly from remote fracture.  Subluxation at C4-5 (2 mm) and C5-6 (4 mm) may be explained by advanced facet osteoarthritis.  No surrounding edematous changes to suggest acute subluxation.  No interspinous widening. No evidence of gross canal hematoma.  Osteopenia.  Diffuse degenerative disc space narrowing, most advanced at C3-4 and C6-7.    No advanced osseous spinal canal or foraminal stenosis, with the most notable canal narrowing at C4-5, where a calcified disc material effaces the ventral thecal sac. Despite bulky spurring of the facet and uncovertebral joints, no advanced more focally notable foraminal stenosis.  Bulky cervical carotid atherosclerotic bifurcation calcification. Heterogeneous thyroid with dominant 8 mm left upper pole nodule.  IMPRESSION:  1.  No evidence of acute cervical spine fracture. Sensitivity moderately decreased by motion. 2.  C4-5 and  C5-6 anterolisthesis, likely accounted for by advanced facet osteoarthritis.   Original Report Authenticated By: Tiburcio Pea   Ct Pelvis Wo Contrast  02/22/2013   *RADIOLOGY REPORT*  Clinical Data: Status post fall.  Left hip pain.  CT PELVIS WITHOUT CONTRAST  Technique:  Multidetector CT imaging of the pelvis was performed following the standard protocol without intravenous contrast.  Comparison: Plain films left hip 02/21/2013.  Findings: The patient has an acute fracture of the left iliac wing. The fracture involves the anterior 4.5 cm of the left ilium and extends approximately 13 cm cranial-caudal.  The fracture is mildly comminuted and there is 1.6 cm of medial displacement of the distal fracture fragment.  The fracture does not extend into the left acetabulum.  No other acute fracture is identified.  The patient has a remote left intertrochanteric fracture with fixation hardware in place.  There is also a  remote right greater trochanter fracture which is a nonunion.  There is moderate to advanced degenerative change about the hips, worse on the right. The sacroiliac joints and symphysis pubis appear normal.  Soft tissue structures demonstrate hematoma formation about the patient's fracture, worse in the left iliacus muscle.  There is also hematoma deep to the left rectus abdominus muscle.  IMPRESSION:  1.  Acute left iliac wing fracture with associated hematoma formation.  As noted, the fracture does not extend into the left hip. 2.  Remote right greater trochanter and left intertrochanteric fractures.   Original Report Authenticated By: Holley Dexter, M.D.   Dg Shoulder Left  02/22/2013   *RADIOLOGY REPORT*  Clinical Data: Fall, left shoulder pain.  LEFT SHOULDER - 2+ VIEW  Comparison: None.  Findings: Mild degenerative change at the acromioclavicular and glenohumeral joints.  No fracture or dislocation.  Adjacent ribs appear intact.  IMPRESSION: Mild degenerative change.  No acute findings.    Original Report Authenticated By: Davonna Belling, M.D.    Scheduled Meds: . aspirin  325 mg Oral Daily  . feeding supplement  237 mL Oral BID BM  . insulin aspart  0-5 Units Subcutaneous QHS  . insulin aspart  0-9 Units Subcutaneous TID WC  . mirtazapine  7.5 mg Oral QHS  . multivitamin with minerals  1 tablet Oral Daily  . senna-docusate  1 tablet Oral QHS  . sertraline  100 mg Oral QHS  . simvastatin  40 mg Oral QPM  . sodium chloride  3 mL Intravenous Q12H   Continuous Infusions:   Principal Problem:   Syncope and collapse Active Problems:   Diabetes   Hypertension   Acute encephalopathy   History of CVA (cerebrovascular accident)  Time spent: 57  Pamella Pert, MD [facility] Pager [phone]. If 7 PM - 7 AM, please contact night-coverage at [email or web address], password [facility] 02/23/2013, 5:27 PM  LOS: 2 days

## 2013-02-23 NOTE — Progress Notes (Signed)
Pt and daughter received discharge instructions at bedside. OT came to work with patient and helped her get dressed for discharge. Daughter told OT that she would go find her car and then come back to the room for her mother. Pt's daughter came back about an hour later, stating that she still hadn't found her car. Nurse tech, daughter, and patient in wheelchair left for discharge. About an hour later, patient's daughter comes up to the floor and seems confused. I asked her if she forgot something and she said no. She then went to the room asking where her mother was. She then asked, "I took my mom home, right?". Patient found sitting on bench outside of hospital. Nurse tech sat with patient while daughter for the 3rd time looked for her car with security, stating that she wasn't sure which car she drove. Nelva Bush, CSW (working with patient throughout hospital stay) called over phone and it was decided to bring patient back to the unit to ensure a safe ride home. Patient very lethargic and slurred/garbled speech worse than normal, barely any words comprehensible. Manual BP 100/50, SR 80's, Sats mid 90's. Patient taken to room and Dr. Elvera Lennox at bedside. MRI ordered. Pt too lethargic for swallow eval, so automatic fail. NIH 8. Daughter advised by Dr. Elvera Lennox to go to the emergency room for evaluation as she could not remember what had happened and stated that she "had gone home to take a nap and had forgotten that she was supposed to take her mother home". She declined medical treatment. Pt currently resting in bed, receiving IV bolus and daughter at bedside. Will continue to monitor closely. Duwaine Maxin, RN

## 2013-02-23 NOTE — Progress Notes (Signed)
Occupational Therapy Treatment Patient Details Name: Brandy Bailey MRN: [id number] DOB: 1926/11/21 Today's Date: 02/23/2013 Time: 4098-1191 OT Time Calculation (min): 33 min  OT Assessment / Plan / Recommendation  OT comments  Pt requires min A for BADLs and functional mobility.  Dtr insists she will be able to provide this level of assist.  Discussed need to use w/c to bump pt up chairs and have another person assist.  Dtr insists she knows how to do this.  Pt reports she has no w/c; however, dtr insists they do.  Dtr unable to recall where she parked her car - unsure if dtr is safe to assist pt at home - noted all services set up with HHOT.  Pt for discharge home today.  Remainder of education deferred to [facility]  Follow Up Recommendations  Home health OT;Supervision/Assistance - 24 hour    Barriers to Discharge       Equipment Recommendations  None recommended by OT (defer to [facility])    Recommendations for Other Services    Frequency Min 2X/week   Progress towards OT Goals Progress towards OT goals: Progressing toward goals  Plan Discharge plan needs to be updated    Precautions / Restrictions Precautions Precautions: Fall Restrictions Weight Bearing Restrictions: Yes LLE Weight Bearing: Weight bearing as tolerated   Pertinent Vitals/Pain     ADL  Upper Body Dressing: Set up Where Assessed - Upper Body Dressing: Unsupported sitting Lower Body Dressing: Minimal assistance Where Assessed - Lower Body Dressing: Supported sit to stand Toilet Transfer: Minimal assistance Toilet Transfer Method: Sit to stand;Stand pivot Toilet Transfer Equipment: Raised toilet seat with arms (or 3-in-1 over toilet) Toileting - Clothing Manipulation and Hygiene: Minimal assistance Where Assessed - Glass blower/designer Manipulation and Hygiene: Standing Equipment Used: Rolling walker Transfers/Ambulation Related to ADLs: min A with rolling walker.  Pt required verbal cues and assist for  sequencing ADL Comments: Pt keeps Lt. leg extended - she reports that she was not able to flex Lt. knee PTA due to Femur fx in March of this year.  Dtr present and reports plan is for discharge home today, and she will be available to assist pt as needed.  Pt with limited mobility (ambulated ~20' and was limited due to pain.  Dtr reports 6 steps into house.  Drt reports they have a w/c, pt insists they do not.  Talked to dtr about bumping pt up the steps in the w/c and she reports she knows how to do this, and needs no further instruction.  Instructed her to have a second person to assist.  She verbally agreed.      OT Diagnosis:    OT Problem List:   OT Treatment Interventions:     OT Goals(current goals can now be found in the care plan section) Acute Rehab OT Goals Patient Stated Goal: To be able to walk OT Goal Formulation: With patient Time For Goal Achievement: 03/08/13 Potential to Achieve Goals: Good ADL Goals Pt Will Perform Grooming: with min guard assist;standing Pt Will Perform Lower Body Bathing: with min assist;sit to/from stand Pt Will Perform Upper Body Dressing: with min guard assist;sitting Pt Will Perform Lower Body Dressing: with min assist;sit to/from stand Pt Will Transfer to Toilet: with min assist;ambulating Pt Will Perform Toileting - Clothing Manipulation and hygiene: with min assist;sit to/from stand  Visit Information  Last OT Received On: 02/23/13 Assistance Needed: +1 History of Present Illness: Brandy Bailey is a 79 y.o. Caucasian female with history  of hypertension, osteoarthritis, diet controlled diabetes, hyperlipidemia, and depression who presents s/p fall with pain in L hip and L shoulder region.     Subjective Data      Prior Functioning       Cognition  Cognition Arousal/Alertness: Awake/alert Behavior During Therapy: WFL for tasks assessed/performed Overall Cognitive Status: Difficult to assess Area of Impairment: Problem  solving;Memory Memory: Decreased short-term memory Problem Solving: Slow processing;Difficulty sequencing;Requires verbal cues;Requires tactile cues General Comments: Pt and daughter providing contradictory info - unsure of what info is correct.  Dtr unable to recall where she had parked, left at end of session to pull car to entrance, but had not returned for pt ~30 mins later (spoke with RN).   Difficult to assess due to:  (unsure of dtr's reliability)    Mobility  Bed Mobility Bed Mobility: Sitting - Scoot to Edge of Bed;Supine to Sit Supine to Sit: 3: Mod assist;HOB flat Sitting - Scoot to Edge of Bed: 4: Min assist Details for Bed Mobility Assistance: Pt requires step by step verbal and tactile cues and assist to move Lt. LE and lift shoulders Transfers Transfers: Sit to Stand;Stand to Sit Sit to Stand: 4: Min assist;With upper extremity assist;From bed;From chair/3-in-1 Stand to Sit: 4: Min assist;With upper extremity assist;To bed;To chair/3-in-1 Details for Transfer Assistance: Pt requires v. cues for hand placement, assist to lift buttocks from surface and to extend hips    Exercises      Balance     End of Session OT - End of Session Equipment Utilized During Treatment: Rolling walker Activity Tolerance: Patient limited by pain Patient left: in chair;with call bell/phone within reach;with family/visitor present Nurse Communication: Mobility status  GO Functional Assessment Tool Used: clinical judgement Functional Limitation: Self care Self Care Current Status (A5409): At least 60 percent but less than 80 percent impaired, limited or restricted Self Care Goal Status (W1191): At least 40 percent but less than 60 percent impaired, limited or restricted Self Care Discharge Status [phone]): At least 20 percent but less than 40 percent impaired, limited or restricted   Havier Deeb M 02/23/2013, 3:53 PM
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
## 2013-02-24 ENCOUNTER — Observation Stay (HOSPITAL_COMMUNITY): Payer: Medicare Other

## 2013-02-24 LAB — BASIC METABOLIC PANEL
BUN: 19 mg/dL (ref 6–23)
Chloride: 105 mEq/L (ref 96–112)
Creatinine, Ser: 0.53 mg/dL (ref 0.50–1.10)
Glucose, Bld: 121 mg/dL — ABNORMAL HIGH (ref 70–99)
Potassium: 4.3 mEq/L (ref 3.5–5.1)

## 2013-02-24 LAB — GLUCOSE, CAPILLARY: Glucose-Capillary: 94 mg/dL (ref 70–99)

## 2013-02-24 LAB — CBC
HCT: 32 % — ABNORMAL LOW (ref 36.0–46.0)
Hemoglobin: 10.2 g/dL — ABNORMAL LOW (ref 12.0–15.0)
MCV: 84.9 fL (ref 78.0–100.0)
WBC: 5.9 10*3/uL (ref 4.0–10.5)

## 2013-02-24 MED ORDER — GADOBENATE DIMEGLUMINE 529 MG/ML IV SOLN
10.0000 mL | Freq: Once | INTRAVENOUS | Status: AC
  Administered 2013-02-24: 10 mL via INTRAVENOUS

## 2013-02-24 NOTE — Progress Notes (Addendum)
Physical Therapy Treatment Patient Details Name: Brandy Bailey MRN: [id number] DOB: 07-31-1927 Today's Date: 02/24/2013 Time: 4098-1191 PT Time Calculation (min): 24 min  PT Assessment / Plan / Recommendation  PT Comments   Progressing today with ambulation and ability to bear weight through left LE.  Will benefit from SNF level rehab to return to independent prior to return home.  Pt. Did report dizziness, screened for BPPV in recliner tipping head back, no noted nystagmus and only mild dizziness reported.  Will continue to monitor.  Follow Up Recommendations  SNF    Does the patient have the potential to tolerate intense rehabilitation   N/A  Barriers to Discharge        Equipment Recommendations  None recommended by PT    Recommendations for Other Services  None  Frequency Min 3X/week   Progress towards PT Goals Progress towards PT goals: Progressing toward goals  Plan Current plan remains appropriate;Frequency needs to be updated    Precautions / Restrictions Precautions Precautions: Fall Restrictions Weight Bearing Restrictions: Yes LLE Weight Bearing: Weight bearing as tolerated   Pertinent Vitals/Pain At least 6/10 left hip with mobility (pt did not rate)    Mobility  Bed Mobility Supine to Sit: 3: Mod assist;HOB elevated;With rails Sitting - Scoot to Edge of Bed: 3: Mod assist Details for Bed Mobility Assistance: Pt requires step by step verbal and tactile cues and assist to move Lt. LE and lift shoulders Transfers Sit to Stand: 4: Min guard;From bed;With upper extremity assist;From chair/3-in-1 Stand to Sit: 4: Min assist;With armrests;To chair/3-in-1 Details for Transfer Assistance: cues for hand placement and lifting assist Ambulation/Gait Ambulation/Gait Assistance: 4: Min assist Ambulation Distance (Feet): 35 Feet Assistive device: Rolling walker Ambulation/Gait Assistance Details: assist for walker progression at times and turning walker, steadying assist  for balance Gait Pattern: Step-to pattern;Antalgic;Shuffle;Decreased stance time - left;Decreased stride length    Exercises General Exercises - Lower Extremity Ankle Circles/Pumps: Both;10 reps;Seated;AROM Long Arc Quad: AROM;Both;10 reps;Seated Hip Flexion/Marching: AROM;AAROM;Both;10 reps;Seated    PT Goals (current goals can now be found in the care plan section)    Visit Information  Last PT Received On: 02/24/13    Subjective Data   I am so dizzy can't tie this gown.   Cognition  Cognition Arousal/Alertness: Awake/alert Behavior During Therapy: WFL for tasks assessed/performed Overall Cognitive Status: Within Functional Limits for tasks assessed    Balance  Static Sitting Balance Static Sitting - Balance Support: Feet supported Static Sitting - Level of Assistance: 5: Stand by assistance Dynamic Standing Balance Dynamic Standing - Balance Support: Left upper extremity supported;During functional activity Dynamic Standing - Level of Assistance: 4: Min assist Dynamic Standing - Comments: standing doing pericare after toileting  End of Session PT - End of Session Equipment Utilized During Treatment: Gait belt Activity Tolerance: Patient limited by pain Patient left: in chair;with call bell/phone within reach   GP     [facility] 02/24/2013, 10:08 AM Sheran Lawless, PT [phone] 02/24/2013
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
## 2013-02-24 NOTE — Progress Notes (Signed)
[facility] PROGRESS NOTE  Brandy Bailey ZOX:[id number] DOB: 17-Dec-1926 DOA: 02/21/2013 PCP: Ginette Otto, MD  HPI: 79 y.o. Caucasian female with history of hypertension, osteoarthritis, diet controlled diabetes, hyperlipidemia, and depression who presents with the above complaints. Patient is confused and cannot provide any history. No family at bedside to provide any history. Attempted to call telephone number provided in the chart, no response. Most of the history was provided by the ED physician. Patient had a fall it is unclear how and when the patient had a fall and then lost consciousness. The story of how she fell seems to vary. EMS was told patient was changing a light bulb while on a ladder and she fell. Another story was patient was at the fridge and then passed out. Hospitalist service was asked to admit the patient for further care and management.  Assessment/Plan:  Confusion - may be related to patient sitting out in the sun yesterday, completely resolved.  - MRI without acute findings. Dizziness on standing: she denies LOC. Resolved. CE negative. S/p Fall: CT pelvis shows left iliac wing fracture with a hematoma. . She also reports left shoulder pain with ROM. Shoulder x rays ordered.  - conservative management.  Hypokalemia: will be repleted as needed. Normal this morning.  Known diabetic: HBA1C 5.2 H/o CVA: resume aspirin and simvastatin. Repeat MRI for confusion negative Hypertension: controlled.  Thrombocytopenia - has had low platelets in the past. Monitor. No evidence of bleeding. SCDs for prophylaxis. Generalized weakness: PT eval.   Code Status: Full Family Communication: daughter  Disposition Plan: rehab when medically ready, hopefully tomorrow.   Consultants:  none  Procedures:  none  Antibiotics:  Anti-infectives   None     Antibiotics Given (last 72 hours)   None      HPI/Subjective: - no complaints, feeling well  today  Objective: Filed Vitals:   02/24/13 0000 02/24/13 0200 02/24/13 0400 02/24/13 0828  BP: 133/85 150/65 140/67 109/31  Pulse: 94 88 85 89  Temp:  99.6 F (37.6 C)    TempSrc:  Axillary    Resp: 20 20 20 16   Height:      Weight:   49.215 kg (108 lb 8 oz)   SpO2: 96% 96% 96% 96%    Intake/Output Summary (Last 24 hours) at 02/24/13 1208 Last data filed at 02/23/13 2154  Gross per 24 hour  Intake    120 ml  Output    110 ml  Net     10 ml   Filed Weights   02/21/13 2112 02/24/13 0400  Weight: 49.986 kg (110 lb 3.2 oz) 49.215 kg (108 lb 8 oz)    Exam:   General:  NAD  Cardiovascular: regular rate and rhythm, without MRG  Respiratory: good air movement, clear to auscultation throughout, no wheezing  Abdomen: soft, not tender to palpation, positive bowel sounds  MSK: no peripheral edema  Neuro: non focal  Data Reviewed: Basic Metabolic Panel:  Recent Labs Lab 02/21/13 1654 02/22/13 0540 02/23/13 0535 02/23/13 1913 02/24/13 0430  NA 141 137 139 137 137  K 3.8 3.4* 4.2 4.8 4.3  CL 103 100 107 104 105  CO2 30 29 25 25 25   GLUCOSE 110* 141* 125* 142* 121*  BUN 16 15 19  24* 19  CREATININE 0.61 0.63 0.61 0.74 0.53  CALCIUM 9.6 8.9 9.0 9.0 8.7   Liver Function Tests:  Recent Labs Lab 02/21/13 1654  AST 30  ALT 17  ALKPHOS 100  BILITOT 0.6  PROT 7.6  ALBUMIN 3.8   CBC:  Recent Labs Lab 02/21/13 1654 02/22/13 0540 02/23/13 1913 03-18-13 0430  WBC 8.4 6.5 7.6 5.9  NEUTROABS 7.3  --   --   --   HGB 12.6 11.6* 10.1* 10.2*  HCT 40.4 36.4 31.9* 32.0*  MCV 84.9 84.3 85.3 84.9  PLT 151 145* 105* 76*   Cardiac Enzymes:  Recent Labs Lab 02/21/13 1654 02/21/13 2257 02/22/13 0540  TROPONINI <0.30 <0.30 <0.30   CBG:  Recent Labs Lab 02/23/13 1151 02/23/13 1707 02/23/13 2009 03/18/2013 0820 03/18/13 1130  GLUCAP 111* 149* 121* 123* 132*   Studies: Mr Lodema Pilot Contrast  18-Mar-2013   *RADIOLOGY REPORT*  Clinical Data: 79 year old  female with fall and loss of consciousness.  Slurred speech.  MRI HEAD WITHOUT AND WITH CONTRAST  Technique:  Multiplanar, multiecho pulse sequences of the brain and surrounding structures were obtained according to standard protocol without and with intravenous contrast  Contrast: 10mL MULTIHANCE GADOBENATE DIMEGLUMINE 529 MG/ML IV SOLN  Comparison: Head CT 02/21/2013.  Brain MRI 02/12/2013 and earlier.  Findings: No restricted diffusion to suggest acute infarction.  No midline shift, mass effect, evidence of mass lesion, ventriculomegaly, extra-axial collection or acute intracranial hemorrhage.  Cervicomedullary junction and pituitary are within normal limits.  Major intracranial vascular flow voids are stable.  Stable gray and white matter signal throughout the brain . No abnormal enhancement identified.  Left scalp convexity hematoma.  Underlying marrow signal remains normal.  Negative orbit soft tissues except for postoperative changes to the globes. Visualized paranasal sinuses and mastoids are clear.  Grossly negative visualized internal auditory structures.  Negative scalp soft tissues.  Stable visualized cervical spine.  IMPRESSION: 1. No acute intracranial abnormality.  Stable MRI appearance of the brain since 02/12/2013. 2.  Left scalp hematoma.   Original Report Authenticated By: Erskine Speed, M.D.   Dg Shoulder Left  02/22/2013   *RADIOLOGY REPORT*  Clinical Data: Fall, left shoulder pain.  LEFT SHOULDER - 2+ VIEW  Comparison: None.  Findings: Mild degenerative change at the acromioclavicular and glenohumeral joints.  No fracture or dislocation.  Adjacent ribs appear intact.  IMPRESSION: Mild degenerative change.  No acute findings.   Original Report Authenticated By: Davonna Belling, M.D.    Scheduled Meds: . aspirin  325 mg Oral Daily  . feeding supplement  237 mL Oral BID BM  . insulin aspart  0-5 Units Subcutaneous QHS  . insulin aspart  0-9 Units Subcutaneous TID WC  . mirtazapine  7.5 mg  Oral QHS  . multivitamin with minerals  1 tablet Oral Daily  . senna-docusate  1 tablet Oral QHS  . sertraline  100 mg Oral QHS  . simvastatin  40 mg Oral QPM  . sodium chloride  3 mL Intravenous Q12H   Continuous Infusions:   Principal Problem:   Syncope and collapse Active Problems:   Diabetes   Hypertension   Acute encephalopathy   History of CVA (cerebrovascular accident)  Time spent: 25  Pamella Pert, MD [facility] Pager [phone]. If 7 PM - 7 AM, please contact night-coverage at [email or web address], password [facility] 03/18/13, 12:08 PM  LOS: 3 days

## 2013-02-24 NOTE — Progress Notes (Signed)
Occupational Therapy Treatment Patient Details Name: Brandy Bailey MRN: [id number] DOB: 1926/11/04 Today's Date: 02/24/2013 Time: 7829-5621 OT Time Calculation (min): 18 min  OT Assessment / Plan / Recommendation  OT comments  Pt with improved activity tolerance and also demo'ing improvement in LUE AROM (less painful today).  Recommending SNF to further progress rehab and maximize pt's safety and independence with ADLs/functional mobility before eventual return home.  Follow Up Recommendations  SNF;Supervision/Assistance - 24 hour    Barriers to Discharge       Equipment Recommendations  Tub/shower bench    Recommendations for Other Services    Frequency Min 2X/week   Progress towards OT Goals Progress towards OT goals: Progressing toward goals  Plan Discharge plan needs to be updated    Precautions / Restrictions Precautions Precautions: Fall Restrictions Weight Bearing Restrictions: Yes LLE Weight Bearing: Weight bearing as tolerated   Pertinent Vitals/Pain See vitals    ADL  Eating/Feeding: Performed;Modified independent Where Assessed - Eating/Feeding: Chair Grooming: Performed;Brushing hair;Set up Where Assessed - Grooming: Supported sitting Lower Body Dressing: Performed;Minimal assistance Where Assessed - Lower Body Dressing: Unsupported sitting Toilet Transfer: Simulated;Minimal assistance Toilet Transfer Method: Sit to stand Toilet Transfer Equipment:  (chair) Equipment Used: Gait belt;Rolling walker Transfers/Ambulation Related to ADLs: Min assist for sit<>stand transfers x4 trials.  ADL Comments: Pt requires assist to don/doff left sock due to LLE pain.  Once sock is over toes, pt is able to reach forward and pull rest of sock up over heel.  Pt performed bil UE AROM exercises while sitting EOC to work on endurance and activity tolerance. Pt reports left shoulder feels better but still effortful at times to move. Pt also performed sit<>stand x4 trials to also  focus on improving activity tolerance and safety during toilet transfers.    OT Diagnosis:    OT Problem List:   OT Treatment Interventions:     OT Goals(current goals can now be found in the care plan section) Acute Rehab OT Goals Patient Stated Goal: To be able to walk OT Goal Formulation: With patient Time For Goal Achievement: 03/08/13 Potential to Achieve Goals: Good ADL Goals Pt Will Perform Grooming: with min guard assist;standing Pt Will Perform Lower Body Bathing: with min assist;sit to/from stand Pt Will Perform Upper Body Dressing: with min guard assist;sitting Pt Will Perform Lower Body Dressing: with min assist;sit to/from stand Pt Will Transfer to Toilet: with min assist;ambulating Pt Will Perform Toileting - Clothing Manipulation and hygiene: with min assist;sit to/from stand  Visit Information  Last OT Received On: 02/24/13 Assistance Needed: +1    Subjective Data      Prior Functioning       Cognition  Cognition Arousal/Alertness: Awake/alert Behavior During Therapy: WFL for tasks assessed/performed Overall Cognitive Status: Within Functional Limits for tasks assessed    Mobility  Bed Mobility Bed Mobility: Not assessed Transfers Transfers: Sit to Stand;Stand to Sit Sit to Stand: 4: Min assist;4: Min guard;From chair/3-in-1;With armrests;With upper extremity assist Stand to Sit: 4: Min assist;To chair/3-in-1;With armrests;With upper extremity assist Details for Transfer Assistance: Pt requiring min assist x2 trials and min guard for last 2 trials during consecutive sit<>stands.  VCs for hand palcement and assist for power up as well as to control decent to chair.    Exercises       End of Session OT - End of Session Equipment Utilized During Treatment: Rolling walker;Gait belt Activity Tolerance: Patient limited by fatigue Patient left: in chair;with call bell/phone within reach  Nurse Communication: Mobility status  GO Functional Assessment Tool  Used: clinical judgement Functional Limitation: Self care Self Care Current Status (N0272): At least 20 percent but less than 40 percent impaired, limited or restricted Self Care Goal Status (Z3664): At least 40 percent but less than 60 percent impaired, limited or restricted   02/24/2013 Cipriano Mile OTR/L Pager [phone] Office [phone]  Cipriano Mile 02/24/2013, 10:33 AM

## 2013-02-25 DIAGNOSIS — E119 Type 2 diabetes mellitus without complications: Secondary | ICD-10-CM

## 2013-02-25 LAB — CBC
HCT: 26.8 % — ABNORMAL LOW (ref 36.0–46.0)
Hemoglobin: 8.5 g/dL — ABNORMAL LOW (ref 12.0–15.0)
MCH: 26.8 pg (ref 26.0–34.0)
MCV: 84.5 fL (ref 78.0–100.0)
RBC: 3.17 MIL/uL — ABNORMAL LOW (ref 3.87–5.11)

## 2013-02-25 LAB — GLUCOSE, CAPILLARY: Glucose-Capillary: 130 mg/dL — ABNORMAL HIGH (ref 70–99)

## 2013-02-25 MED ORDER — HYDROCODONE-ACETAMINOPHEN 5-325 MG PO TABS: 1.0000 | ORAL_TABLET | Freq: Four times a day (QID) | ORAL | Status: DC | PRN

## 2013-02-25 NOTE — Discharge Summary (Signed)
Physician Discharge Summary  Brandy Bailey MWU:[id number] DOB: [birth year] DOA: 02/21/2013  PCP: Ginette Otto, MD  Admit date: 02/21/2013 Discharge date: 02/25/2013  Time spent: 45 minutes  Recommendations for Outpatient Follow-up:  1. Follow up with PCP in 1-2 weeks   Recommendations for primary care physician for things to follow:  Repeat CBC and BMP early next week  Discharge Diagnoses:  Principal Problem:   Syncope and collapse Active Problems:   Diabetes   Hypertension   Acute encephalopathy   History of CVA (cerebrovascular accident)  Discharge Condition: stable  Diet recommendation: heart healthy, dysphagia 3  Filed Weights   02/21/13 2112 02/24/13 0400 02/25/13 0500  Weight: 49.986 kg (110 lb 3.2 oz) 49.215 kg (108 lb 8 oz) 50.939 kg (112 lb 4.8 oz)    History of present illness:  Brandy Bailey is a 79 y.o. Caucasian female with history of hypertension, osteoarthritis, diet controlled diabetes, hyperlipidemia, and depression who presents with the above complaints. Patient is confused and cannot provide any history. No family at bedside to provide any history. Attempted to call telephone number provided in the chart, no response. Most of the history was provided by the ED physician. Patient had a fall it is unclear how and when the patient had a fall and then lost consciousness. The story of how she fell seems to vary. EMS was told patient was changing a light bulb while on a ladder and she fell. Another story was patient was at the fridge and then passed out. Hospitalist service was asked to admit the patient for further care and management.  Hospital Course:  Dizziness on standing/acute encephalopathy on presentation. Head CT and Cervical spine CT showed no acute abnormality. Chest x-ray and urine analysis shows no clear infectious etiology Patient denied LOC. Patient underwent MRI which was negative for CVA and showed only left scalp hematoma (full read below).  Resolved. Cardiac enzymes are negative.  S/p Fall: CT pelvis shows left iliac wing fracture with a hematoma. . She also reports left shoulder pain with ROM. Orthopedic surgery has been consulted and recommended conservative management for now. Weight bearing as tolerated. Hypokalemia: replete as needed. Resolved.  Known diabetic: HBA1C 5.2, diet controlled. H/o CVA: resume aspirin and simvastatin. Repeat MRI for confusion negative  Hypertension: controlled, BP on discharge 121/76. Thrombocytopenia - has had low platelets in the past. Monitor and stable. No evidence of bleeding, recommend CBC early next week.  Procedures:  none   Consultations:  Orthopedic surgery  Discharge Exam: Filed Vitals:   02/25/13 0000 02/25/13 0400 02/25/13 0500 02/25/13 0743  BP: 118/59 125/60  121/76  Pulse: 93 67  85  Temp: 98.5 F (36.9 C) 99.6 F (37.6 C)  98.6 F (37 C)  TempSrc: Axillary Oral    Resp:  16  16  Height:      Weight:   50.939 kg (112 lb 4.8 oz)   SpO2: 96% 95%  96%   General: NAD Cardiovascular: RRR Respiratory: CTA biL  Discharge Instructions    Medication List    STOP taking these medications       warfarin 2 MG tablet  Commonly known as:  COUMADIN      TAKE these medications       aspirin 325 MG tablet  Take 325 mg by mouth daily.     CALCIUM PLUS VITAMIN D PO  Take 1 tablet by mouth 2 (two) times daily.     HYDROcodone-acetaminophen 5-325 MG per tablet  Commonly  known as:  NORCO  Take 1-2 tablets by mouth every 6 (six) hours as needed for pain. MAXIMUM TOTAL ACETAMINOPHEN DOSE IS 4000 MG PER DAY     mirtazapine 15 MG tablet  Commonly known as:  REMERON  Take 7.5 mg by mouth at bedtime.     sennosides-docusate sodium 8.6-50 MG tablet  Commonly known as:  SENOKOT-S  Take 1 tablet by mouth daily.     sertraline 100 MG tablet  Commonly known as:  ZOLOFT  Take 100 mg by mouth at bedtime.     simvastatin 40 MG tablet  Commonly known as:  ZOCOR  Take 40  mg by mouth every evening.        The results of significant diagnostics from this hospitalization (including imaging, microbiology, ancillary and laboratory) are listed below for reference.    Significant Diagnostic Studies: Dg Chest 2 View  02/21/2013   *RADIOLOGY REPORT*  Clinical Data: Syncope  CHEST - 2 VIEW  Comparison: 10/13/2012  Findings: Large hiatal hernia with an air-fluid level is stable. Lungs remain hyperaerated, a feature of COPD. Left basilar subsegmental atelectasis.  No pneumothorax.  No pleural effusion.  IMPRESSION: Left basilar atelectasis and hiatal hernia with chronic changes related to COPD.   Original Report Authenticated By: Jolaine Click, M.D.   Dg Hip Complete Left  02/22/2013   *RADIOLOGY REPORT*  Clinical Data: Fall and left hip pain.  LEFT HIP - COMPLETE 2+ VIEW  Comparison: 10/14/2012  Findings:  There is irregularity of the left iliac wing suggesting a displaced fracture.  Mild irregularity of the left superior pubic ramus without definite fracture.  Again noted are heterotopic ossifications just superior to the right greater trochanter.  Again noted is intramedullary rod fixation of the left femur with a screw extending through the left femoral head and neck.  There is callus and remodeling of the left femoral trochanters from the previous fracture.  There are severe degenerative changes at the left knee.  IMPRESSION: Probable displaced fracture involving the left iliac wing. Recommend further characterization with CT.  Internal fixation of the left proximal femur fracture with callus formation and remodeling.  No acute fracture in the left femur.   Original Report Authenticated By: Richarda Overlie, M.D.   Ct Head Wo Contrast  02/21/2013   *RADIOLOGY REPORT*  Clinical Data:  Syncopal episode with posterior scalp hematoma.  CT HEAD WITHOUT CONTRAST CT CERVICAL SPINE WITHOUT CONTRAST  Technique:  Multidetector CT imaging of the head and cervical spine was performed following  the standard protocol without intravenous contrast.  Multiplanar CT image reconstructions of the cervical spine were also generated.  Comparison:  10/13/2012 head CT.  CT HEAD  Findings:  Calvarium: Left parietal scalp contusion, no underlying fracture. No lytic or blastic lesion.  Orbits: Bilateral cataract resection.  Brain: No evidence of acute abnormality, such as acute infarction, hemorrhage, hydrocephalus, or mass lesion/mass effect.Unchanged pattern of patchy bilateral deep cerebral white matter low attenuation, consist with chronic small vessel ischemia.  IMPRESSION: 1.  No acute intracranial abnormality.  2.  Left parietal scalp contusion without fracture.  CT CERVICAL SPINE  Findings: No acute fracture detected.   Diagnostic sensitivity moderately decreased due to patient motion, requiring repeat imaging.  Lucency through the upper right C6 articular process is corticated, possibly from remote fracture.  Subluxation at C4-5 (2 mm) and C5-6 (4 mm) may be explained by advanced facet osteoarthritis.  No surrounding edematous changes to suggest acute subluxation.  No interspinous widening. No  evidence of gross canal hematoma.  Osteopenia.  Diffuse degenerative disc space narrowing, most advanced at C3-4 and C6-7.    No advanced osseous spinal canal or foraminal stenosis, with the most notable canal narrowing at C4-5, where a calcified disc material effaces the ventral thecal sac. Despite bulky spurring of the facet and uncovertebral joints, no advanced more focally notable foraminal stenosis.  Bulky cervical carotid atherosclerotic bifurcation calcification. Heterogeneous thyroid with dominant 8 mm left upper pole nodule.  IMPRESSION:  1.  No evidence of acute cervical spine fracture. Sensitivity moderately decreased by motion. 2.  C4-5 and C5-6 anterolisthesis, likely accounted for by advanced facet osteoarthritis.   Original Report Authenticated By: Tiburcio Pea   Ct Cervical Spine Wo  Contrast  02/21/2013   *RADIOLOGY REPORT*  Clinical Data:  Syncopal episode with posterior scalp hematoma.  CT HEAD WITHOUT CONTRAST CT CERVICAL SPINE WITHOUT CONTRAST  Technique:  Multidetector CT imaging of the head and cervical spine was performed following the standard protocol without intravenous contrast.  Multiplanar CT image reconstructions of the cervical spine were also generated.  Comparison:  10/13/2012 head CT.  CT HEAD  Findings:  Calvarium: Left parietal scalp contusion, no underlying fracture. No lytic or blastic lesion.  Orbits: Bilateral cataract resection.  Brain: No evidence of acute abnormality, such as acute infarction, hemorrhage, hydrocephalus, or mass lesion/mass effect.Unchanged pattern of patchy bilateral deep cerebral white matter low attenuation, consist with chronic small vessel ischemia.  IMPRESSION: 1.  No acute intracranial abnormality.  2.  Left parietal scalp contusion without fracture.  CT CERVICAL SPINE  Findings: No acute fracture detected.   Diagnostic sensitivity moderately decreased due to patient motion, requiring repeat imaging.  Lucency through the upper right C6 articular process is corticated, possibly from remote fracture.  Subluxation at C4-5 (2 mm) and C5-6 (4 mm) may be explained by advanced facet osteoarthritis.  No surrounding edematous changes to suggest acute subluxation.  No interspinous widening. No evidence of gross canal hematoma.  Osteopenia.  Diffuse degenerative disc space narrowing, most advanced at C3-4 and C6-7.    No advanced osseous spinal canal or foraminal stenosis, with the most notable canal narrowing at C4-5, where a calcified disc material effaces the ventral thecal sac. Despite bulky spurring of the facet and uncovertebral joints, no advanced more focally notable foraminal stenosis.  Bulky cervical carotid atherosclerotic bifurcation calcification. Heterogeneous thyroid with dominant 8 mm left upper pole nodule.  IMPRESSION:  1.  No evidence  of acute cervical spine fracture. Sensitivity moderately decreased by motion. 2.  C4-5 and C5-6 anterolisthesis, likely accounted for by advanced facet osteoarthritis.   Original Report Authenticated By: Tiburcio Pea   Ct Pelvis Wo Contrast  02/22/2013   *RADIOLOGY REPORT*  Clinical Data: Status post fall.  Left hip pain.  CT PELVIS WITHOUT CONTRAST  Technique:  Multidetector CT imaging of the pelvis was performed following the standard protocol without intravenous contrast.  Comparison: Plain films left hip 02/21/2013.  Findings: The patient has an acute fracture of the left iliac wing. The fracture involves the anterior 4.5 cm of the left ilium and extends approximately 13 cm cranial-caudal.  The fracture is mildly comminuted and there is 1.6 cm of medial displacement of the distal fracture fragment.  The fracture does not extend into the left acetabulum.  No other acute fracture is identified.  The patient has a remote left intertrochanteric fracture with fixation hardware in place.  There is also a remote right greater trochanter fracture which is a  nonunion.  There is moderate to advanced degenerative change about the hips, worse on the right. The sacroiliac joints and symphysis pubis appear normal.  Soft tissue structures demonstrate hematoma formation about the patient's fracture, worse in the left iliacus muscle.  There is also hematoma deep to the left rectus abdominus muscle.  IMPRESSION:  1.  Acute left iliac wing fracture with associated hematoma formation.  As noted, the fracture does not extend into the left hip. 2.  Remote right greater trochanter and left intertrochanteric fractures.   Original Report Authenticated By: Holley Dexter, M.D.   Mr Brain Wo Contrast  02/12/2013   *RADIOLOGY REPORT*  Clinical Data: Difficulty walking with weakness and numbness on the left for 2 months.  Remote history of falling.  MRI HEAD WITHOUT CONTRAST  Technique:  Multiplanar, multiecho pulse sequences of  the brain and surrounding structures were obtained according to standard protocol without intravenous contrast.  Comparison: 10/14/2012 MRI.  Findings: There is no evidence for acute infarction, intracranial hemorrhage, mass lesion, hydrocephalus, or extra-axial fluid.  Mild age related atrophy.  Mild to moderate small vessel disease. Prominent perivascular spaces are likely secondary to hypertension. No foci of chronic hemorrhage.  No midline shift.  Flow voids are maintained in the major intracranial vessels.  Normal midline structures.  No worrisome osseous findings.  No acute sinus or mastoid disease.  Bilateral cataract extraction.  Compared with a 10/14/2012, there has been intervening lacunar infarct in the right thalamus ( see image 13 series 7/8 and image 41 series 9).  This does not show restricted diffusion but does demonstrate prolonged T1 and T2 signal.  The MR appearance is consistent with a insult occurring approximately 2 months earlier as correlated with the history.  IMPRESSION: Stable atrophy and chronic microvascular ischemic change.  No acute intracranial findings.  There is intervening development of a right thalamic lacunar infarct which was not present in March;  this insult would be consistent with an event occurring 2 months ago.   Original Report Authenticated By: Davonna Belling, M.D.   Mr Laqueta Jean Wo Contrast  02/24/2013   *RADIOLOGY REPORT*  Clinical Data: 79 year old female with fall and loss of consciousness.  Slurred speech.  MRI HEAD WITHOUT AND WITH CONTRAST  Technique:  Multiplanar, multiecho pulse sequences of the brain and surrounding structures were obtained according to standard protocol without and with intravenous contrast  Contrast: 10mL MULTIHANCE GADOBENATE DIMEGLUMINE 529 MG/ML IV SOLN  Comparison: Head CT 02/21/2013.  Brain MRI 02/12/2013 and earlier.  Findings: No restricted diffusion to suggest acute infarction.  No midline shift, mass effect, evidence of mass lesion,  ventriculomegaly, extra-axial collection or acute intracranial hemorrhage.  Cervicomedullary junction and pituitary are within normal limits.  Major intracranial vascular flow voids are stable.  Stable gray and white matter signal throughout the brain . No abnormal enhancement identified.  Left scalp convexity hematoma.  Underlying marrow signal remains normal.  Negative orbit soft tissues except for postoperative changes to the globes. Visualized paranasal sinuses and mastoids are clear.  Grossly negative visualized internal auditory structures.  Negative scalp soft tissues.  Stable visualized cervical spine.  IMPRESSION: 1. No acute intracranial abnormality.  Stable MRI appearance of the brain since 02/12/2013. 2.  Left scalp hematoma.   Original Report Authenticated By: Erskine Speed, M.D.   Dg Shoulder Left  02/22/2013   *RADIOLOGY REPORT*  Clinical Data: Fall, left shoulder pain.  LEFT SHOULDER - 2+ VIEW  Comparison: None.  Findings: Mild degenerative change at  the acromioclavicular and glenohumeral joints.  No fracture or dislocation.  Adjacent ribs appear intact.  IMPRESSION: Mild degenerative change.  No acute findings.   Original Report Authenticated By: Davonna Belling, M.D.   Labs: Basic Metabolic Panel:  Recent Labs Lab 02/21/13 1654 02/22/13 0540 02/23/13 0535 02/23/13 1913 02/24/13 0430  NA 141 137 139 137 137  K 3.8 3.4* 4.2 4.8 4.3  CL 103 100 107 104 105  CO2 30 29 25 25 25   GLUCOSE 110* 141* 125* 142* 121*  BUN 16 15 19  24* 19  CREATININE 0.61 0.63 0.61 0.74 0.53  CALCIUM 9.6 8.9 9.0 9.0 8.7   Liver Function Tests:  Recent Labs Lab 02/21/13 1654  AST 30  ALT 17  ALKPHOS 100  BILITOT 0.6  PROT 7.6  ALBUMIN 3.8   CBC:  Recent Labs Lab 02/21/13 1654 02/22/13 0540 02/23/13 1913 02/24/13 0430 02/25/13 0450  WBC 8.4 6.5 7.6 5.9 5.1  NEUTROABS 7.3  --   --   --   --   HGB 12.6 11.6* 10.1* 10.2* 8.5*  HCT 40.4 36.4 31.9* 32.0* 26.8*  MCV 84.9 84.3 85.3 84.9 84.5   PLT 151 145* 105* 76* 100*   Cardiac Enzymes:  Recent Labs Lab 02/21/13 1654 02/21/13 2257 02/22/13 0540  TROPONINI <0.30 <0.30 <0.30   CBG:  Recent Labs Lab 02/24/13 0820 02/24/13 1130 02/24/13 1745 02/24/13 1952 02/25/13 0722  GLUCAP 123* 132* 94 205* 101*       Signed:  GHERGHE, COSTIN  [facility] 02/25/2013, 10:05 AM

## 2013-02-25 NOTE — Progress Notes (Signed)
Pt discharged to [facility] per MD order. Pt received and reviewed all discharge instructions and medication information including follow-up appointments and prescriptions. Pt verbalized understanding. Pt alert and oriented at discharge with no complaints of pain. Pt transported to [facility] via [place]. All belongings sent with pt. Report called to receiving RN at [facility] prior to discharge. Brandy Bailey

## 2013-02-25 NOTE — Progress Notes (Signed)
Clinical Child psychotherapist (CSW) has prepared and placed pt dc packet in shadow chart. Facility contact number placed in packet for RN to call for report. Pt remains aware and agreeable of dc to [facility]. CSW has notified APS of dc plans. No additional CSW needs, CSW signing off.  Theresia Bough, MSW, Theresia Majors [phone]

## 2013-03-02 ENCOUNTER — Non-Acute Institutional Stay (SKILLED_NURSING_FACILITY): Payer: Medicare Other | Admitting: Internal Medicine

## 2013-03-02 DIAGNOSIS — G9340 Encephalopathy, unspecified: Secondary | ICD-10-CM

## 2013-03-02 DIAGNOSIS — R55 Syncope and collapse: Secondary | ICD-10-CM

## 2013-03-02 DIAGNOSIS — E119 Type 2 diabetes mellitus without complications: Secondary | ICD-10-CM

## 2013-03-02 DIAGNOSIS — Z8673 Personal history of transient ischemic attack (TIA), and cerebral infarction without residual deficits: Secondary | ICD-10-CM

## 2013-03-02 DIAGNOSIS — E785 Hyperlipidemia, unspecified: Secondary | ICD-10-CM

## 2013-03-02 DIAGNOSIS — I10 Essential (primary) hypertension: Secondary | ICD-10-CM

## 2013-03-02 NOTE — Progress Notes (Signed)
Patient ID: Brandy Bailey, female   DOB: 12/14/1926, 79 y.o.   MRN: [id number] Provider:  Gwenith Spitz. Renato Gails, D.O., C.M.D. Location:  [facility]  PCP: Ginette Otto, MD  Code Status: DNR  No Known Allergies  Chief Complaint: new admission s/p hospitalization with syncope while tilting her head up on a step ladder  HPI: 79 y.o. female with h/o prior right thalamic infarct since March and chronic ischemic changes on her MRI was admitted here for short term rehab s/p syncopal event when she tilted her head up while on a step ladder.  She also has previously had a left hip fracture s/p nail 10/14/12.  She has HTN, OA, DMII.    Review of Systems:  Review of Systems  Constitutional: Negative for fever and chills.  HENT: Negative for congestion.   Eyes: Negative for blurred vision.  Respiratory: Negative for cough and shortness of breath.   Cardiovascular: Negative for chest pain and leg swelling.  Gastrointestinal: Negative for constipation.  Genitourinary: Negative for dysuria.  Musculoskeletal: Positive for falls and joint pain. Negative for myalgias.  Skin: Negative for rash.  Neurological: Positive for dizziness and loss of consciousness.  Endo/Heme/Allergies: Bruises/bleeds easily.  Psychiatric/Behavioral: Positive for memory loss.     Past Medical History  Diagnosis Date  . Hypertension   . Osteoarthritis   . Hypercholesteremia   . Type II diabetes mellitus   . Depression    Past Surgical History  Procedure Laterality Date  . Intramedullary (im) nail intertrochanteric Left 10/14/2012    Procedure: INTRAMEDULLARY (IM) NAIL INTERTROCHANTRIC;  Surgeon: Eulas Post, MD;  Location: [facility] OR;  Service: Orthopedics;  Laterality: Left;  . Cataract extraction w/ intraocular lens  implant, bilateral  1999-2004   Social History:   reports that she has never smoked. She has never used smokeless tobacco. She reports that she does not drink alcohol or use illicit  drugs.  No family history on file.  Medications: Patient's Medications  New Prescriptions   No medications on file  Previous Medications   ASPIRIN 325 MG TABLET    Take 325 mg by mouth daily.   CALCIUM CARBONATE-VITAMIN D (CALCIUM PLUS VITAMIN D PO)    Take 1 tablet by mouth 2 (two) times daily.   HYDROCODONE-ACETAMINOPHEN (NORCO) 5-325 MG PER TABLET    Take 1-2 tablets by mouth every 6 (six) hours as needed for pain. MAXIMUM TOTAL ACETAMINOPHEN DOSE IS 4000 MG PER DAY   MIRTAZAPINE (REMERON) 15 MG TABLET    Take 7.5 mg by mouth at bedtime.   SENNOSIDES-DOCUSATE SODIUM (SENOKOT-S) 8.6-50 MG TABLET    Take 1 tablet by mouth daily.   SERTRALINE (ZOLOFT) 100 MG TABLET    Take 100 mg by mouth at bedtime.   SIMVASTATIN (ZOCOR) 40 MG TABLET    Take 40 mg by mouth every evening.  Modified Medications   No medications on file  Discontinued Medications   No medications on file     Physical Exam: There were no vitals filed for this visit. Physical Exam  Constitutional: She is oriented to person, place, and time. She appears well-developed and well-nourished. No distress.  HENT:  Head: Normocephalic and atraumatic.  Right Ear: External ear normal.  Left Ear: External ear normal.  Nose: Nose normal.  Mouth/Throat: Oropharynx is clear and moist.  Eyes: EOM are normal. Pupils are equal, round, and reactive to light.  Neck: Normal range of motion. No JVD present.  Cardiovascular: Normal rate, regular rhythm, normal  heart sounds and intact distal pulses.   Pulmonary/Chest: Effort normal and breath sounds normal. No respiratory distress.  Abdominal: Soft. Bowel sounds are normal. She exhibits no distension and no mass. There is no tenderness.  Musculoskeletal: Normal range of motion.  Neurological: She is alert and oriented to person, place, and time.  pleasant  Skin: Skin is warm and dry.  Psychiatric: She has a normal mood and affect.     Labs reviewed: Basic Metabolic  Panel:  Recent Labs  02/23/13 0535 02/23/13 1913 02/24/13 0430  NA 139 137 137  K 4.2 4.8 4.3  CL 107 104 105  CO2 25 25 25   GLUCOSE 125* 142* 121*  BUN 19 24* 19  CREATININE 0.61 0.74 0.53  CALCIUM 9.0 9.0 8.7   Liver Function Tests:  Recent Labs  02/21/13 1654  AST 30  ALT 17  ALKPHOS 100  BILITOT 0.6  PROT 7.6  ALBUMIN 3.8   No results found for this basename: LIPASE, AMYLASE,  in the last 8760 hours No results found for this basename: AMMONIA,  in the last 8760 hours CBC:  Recent Labs  10/13/12 1155  02/21/13 1654  02/23/13 1913 02/24/13 0430 02/25/13 0450  WBC 4.1  < > 8.4  < > 7.6 5.9 5.1  NEUTROABS 3.1  --  7.3  --   --   --   --   HGB 12.7  < > 12.6  < > 10.1* 10.2* 8.5*  HCT 38.8  < > 40.4  < > 31.9* 32.0* 26.8*  MCV 83.6  < > 84.9  < > 85.3 84.9 84.5  PLT 176  < > 151  < > 105* 76* 100*  < > = values in this interval not displayed. Cardiac Enzymes:  Recent Labs  02/21/13 1654 02/21/13 2257 02/22/13 0540  TROPONINI <0.30 <0.30 <0.30  CBG:  Recent Labs  02/24/13 1952 02/25/13 0722 02/25/13 1144  GLUCAP 205* 101* 130*    Imaging and Procedures: Dg Chest 2 View  02/21/2013 *RADIOLOGY REPORT* Clinical Data: Syncope CHEST - 2 VIEW Comparison: 10/13/2012 Findings: Large hiatal hernia with an air-fluid level is stable. Lungs remain hyperaerated, a feature of COPD. Left basilar subsegmental atelectasis. No pneumothorax. No pleural effusion. IMPRESSION: Left basilar atelectasis and hiatal hernia with chronic changes related to COPD. Original Report Authenticated By: Jolaine Click, M.D.  Dg Hip Complete Left  02/22/2013 *RADIOLOGY REPORT* Clinical Data: Fall and left hip pain. LEFT HIP - COMPLETE 2+ VIEW Comparison: 10/14/2012 Findings: There is irregularity of the left iliac wing suggesting a displaced fracture. Mild irregularity of the left superior pubic ramus without definite fracture. Again noted are heterotopic ossifications just superior to the  right greater trochanter. Again noted is intramedullary rod fixation of the left femur with a screw extending through the left femoral head and neck. There is callus and remodeling of the left femoral trochanters from the previous fracture. There are severe degenerative changes at the left knee. IMPRESSION: Probable displaced fracture involving the left iliac wing. Recommend further characterization with CT. Internal fixation of the left proximal femur fracture with callus formation and remodeling. No acute fracture in the left femur. Original Report Authenticated By: Richarda Overlie, M.D.  Ct Head Wo Contrast  02/21/2013 *RADIOLOGY REPORT* Clinical Data: Syncopal episode with posterior scalp hematoma. CT HEAD WITHOUT CONTRAST CT CERVICAL SPINE WITHOUT CONTRAST Technique: Multidetector CT imaging of the head and cervical spine was performed following the standard protocol without intravenous contrast. Multiplanar CT image reconstructions of  the cervical spine were also generated. Comparison: 10/13/2012 head CT. CT HEAD Findings: Calvarium: Left parietal scalp contusion, no underlying fracture. No lytic or blastic lesion. Orbits: Bilateral cataract resection. Brain: No evidence of acute abnormality, such as acute infarction, hemorrhage, hydrocephalus, or mass lesion/mass effect.Unchanged pattern of patchy bilateral deep cerebral white matter low attenuation, consist with chronic small vessel ischemia. IMPRESSION: 1. No acute intracranial abnormality. 2. Left parietal scalp contusion without fracture. CT CERVICAL SPINE Findings: No acute fracture detected. Diagnostic sensitivity moderately decreased due to patient motion, requiring repeat imaging. Lucency through the upper right C6 articular process is corticated, possibly from remote fracture. Subluxation at C4-5 (2 mm) and C5-6 (4 mm) may be explained by advanced facet osteoarthritis. No surrounding edematous changes to suggest acute subluxation. No interspinous  widening. No evidence of gross canal hematoma. Osteopenia. Diffuse degenerative disc space narrowing, most advanced at C3-4 and C6-7. No advanced osseous spinal canal or foraminal stenosis, with the most notable canal narrowing at C4-5, where a calcified disc material effaces the ventral thecal sac. Despite bulky spurring of the facet and uncovertebral joints, no advanced more focally notable foraminal stenosis. Bulky cervical carotid atherosclerotic bifurcation calcification. Heterogeneous thyroid with dominant 8 mm left upper pole nodule. IMPRESSION: 1. No evidence of acute cervical spine fracture. Sensitivity moderately decreased by motion. 2. C4-5 and C5-6 anterolisthesis, likely accounted for by advanced facet osteoarthritis. Original Report Authenticated By: Tiburcio Pea  Ct Cervical Spine Wo Contrast  02/21/2013 *RADIOLOGY REPORT* Clinical Data: Syncopal episode with posterior scalp hematoma. CT HEAD WITHOUT CONTRAST CT CERVICAL SPINE WITHOUT CONTRAST Technique: Multidetector CT imaging of the head and cervical spine was performed following the standard protocol without intravenous contrast. Multiplanar CT image reconstructions of the cervical spine were also generated. Comparison: 10/13/2012 head CT. CT HEAD Findings: Calvarium: Left parietal scalp contusion, no underlying fracture. No lytic or blastic lesion. Orbits: Bilateral cataract resection. Brain: No evidence of acute abnormality, such as acute infarction, hemorrhage, hydrocephalus, or mass lesion/mass effect.Unchanged pattern of patchy bilateral deep cerebral white matter low attenuation, consist with chronic small vessel ischemia. IMPRESSION: 1. No acute intracranial abnormality. 2. Left parietal scalp contusion without fracture. CT CERVICAL SPINE Findings: No acute fracture detected. Diagnostic sensitivity moderately decreased due to patient motion, requiring repeat imaging. Lucency through the upper right C6 articular process is corticated,  possibly from remote fracture. Subluxation at C4-5 (2 mm) and C5-6 (4 mm) may be explained by advanced facet osteoarthritis. No surrounding edematous changes to suggest acute subluxation. No interspinous widening. No evidence of gross canal hematoma. Osteopenia. Diffuse degenerative disc space narrowing, most advanced at C3-4 and C6-7. No advanced osseous spinal canal or foraminal stenosis, with the most notable canal narrowing at C4-5, where a calcified disc material effaces the ventral thecal sac. Despite bulky spurring of the facet and uncovertebral joints, no advanced more focally notable foraminal stenosis. Bulky cervical carotid atherosclerotic bifurcation calcification. Heterogeneous thyroid with dominant 8 mm left upper pole nodule. IMPRESSION: 1. No evidence of acute cervical spine fracture. Sensitivity moderately decreased by motion. 2. C4-5 and C5-6 anterolisthesis, likely accounted for by advanced facet osteoarthritis. Original Report Authenticated By: Tiburcio Pea  Ct Pelvis Wo Contrast  02/22/2013 *RADIOLOGY REPORT* Clinical Data: Status post fall. Left hip pain. CT PELVIS WITHOUT CONTRAST Technique: Multidetector CT imaging of the pelvis was performed following the standard protocol without intravenous contrast. Comparison: Plain films left hip 02/21/2013. Findings: The patient has an acute fracture of the left iliac wing. The fracture involves the anterior 4.5  cm of the left ilium and extends approximately 13 cm cranial-caudal. The fracture is mildly comminuted and there is 1.6 cm of medial displacement of the distal fracture fragment. The fracture does not extend into the left acetabulum. No other acute fracture is identified. The patient has a remote left intertrochanteric fracture with fixation hardware in place. There is also a remote right greater trochanter fracture which is a nonunion. There is moderate to advanced degenerative change about the hips, worse on the right. The sacroiliac  joints and symphysis pubis appear normal. Soft tissue structures demonstrate hematoma formation about the patient's fracture, worse in the left iliacus muscle. There is also hematoma deep to the left rectus abdominus muscle. IMPRESSION: 1. Acute left iliac wing fracture with associated hematoma formation. As noted, the fracture does not extend into the left hip. 2. Remote right greater trochanter and left intertrochanteric fractures. Original Report Authenticated By: Holley Dexter, M.D.  Mr Brain Wo Contrast  02/12/2013 *RADIOLOGY REPORT* Clinical Data: Difficulty walking with weakness and numbness on the left for 2 months. Remote history of falling. MRI HEAD WITHOUT CONTRAST Technique: Multiplanar, multiecho pulse sequences of the brain and surrounding structures were obtained according to standard protocol without intravenous contrast. Comparison: 10/14/2012 MRI. Findings: There is no evidence for acute infarction, intracranial hemorrhage, mass lesion, hydrocephalus, or extra-axial fluid. Mild age related atrophy. Mild to moderate small vessel disease. Prominent perivascular spaces are likely secondary to hypertension. No foci of chronic hemorrhage. No midline shift. Flow voids are maintained in the major intracranial vessels. Normal midline structures. No worrisome osseous findings. No acute sinus or mastoid disease. Bilateral cataract extraction. Compared with a 10/14/2012, there has been intervening lacunar infarct in the right thalamus ( see image 13 series 7/8 and image 41 series 9). This does not show restricted diffusion but does demonstrate prolonged T1 and T2 signal. The MR appearance is consistent with a insult occurring approximately 2 months earlier as correlated with the history. IMPRESSION: Stable atrophy and chronic microvascular ischemic change. No acute intracranial findings. There is intervening development of a right thalamic lacunar infarct which was not present in March; this insult would  be consistent with an event occurring 2 months ago. Original Report Authenticated By: Davonna Belling, M.D.  Mr Laqueta Jean Wo Contrast  02/24/2013 *RADIOLOGY REPORT* Clinical Data: 79 year old female with fall and loss of consciousness. Slurred speech. MRI HEAD WITHOUT AND WITH CONTRAST Technique: Multiplanar, multiecho pulse sequences of the brain and surrounding structures were obtained according to standard protocol without and with intravenous contrast Contrast: 10mL MULTIHANCE GADOBENATE DIMEGLUMINE 529 MG/ML IV SOLN Comparison: Head CT 02/21/2013. Brain MRI 02/12/2013 and earlier. Findings: No restricted diffusion to suggest acute infarction. No midline shift, mass effect, evidence of mass lesion, ventriculomegaly, extra-axial collection or acute intracranial hemorrhage. Cervicomedullary junction and pituitary are within normal limits. Major intracranial vascular flow voids are stable. Stable gray and white matter signal throughout the brain . No abnormal enhancement identified. Left scalp convexity hematoma. Underlying marrow signal remains normal. Negative orbit soft tissues except for postoperative changes to the globes. Visualized paranasal sinuses and mastoids are clear. Grossly negative visualized internal auditory structures. Negative scalp soft tissues. Stable visualized cervical spine. IMPRESSION: 1. No acute intracranial abnormality. Stable MRI appearance of the brain since 02/12/2013. 2. Left scalp hematoma. Original Report Authenticated By: Erskine Speed, M.D.  Dg Shoulder Left  02/22/2013 *RADIOLOGY REPORT* Clinical Data: Fall, left shoulder pain. LEFT SHOULDER - 2+ VIEW Comparison: None. Findings: Mild degenerative change at the acromioclavicular  and glenohumeral joints. No fracture or dislocation. Adjacent ribs appear intact. IMPRESSION: Mild degenerative change. No acute findings. Original Report Authenticated By: Davonna Belling, M.D.   Assessment/Plan 1. Syncope and collapse -will check orthostatics  while here to rule this out as a cause due to admitting to lightheadedness on standing and has had bp med adjustments since stroke  2. Hypertension -bp at goal, cont to monitor  3. Acute encephalopathy -resolved, but seems she may have some mild cognitive impairment vs. Early vascular dementia--will see how she does functionally and what she scores on her BIMS while here  4. Diabetes mellitus II with neurological complications -cont current treatment and monitor cbgs  5. History of CVA (cerebrovascular accident) -thalamic, may have affected her balance also and contributed to her fall from the step ladder  6. Hyperlipidemia LDL goal < 100 -f/u lipids, cont zocor  Labs/tests ordered:  Orthostatics, cbc, bmp

## 2013-03-10 ENCOUNTER — Non-Acute Institutional Stay (SKILLED_NURSING_FACILITY): Payer: Medicare Other | Admitting: Internal Medicine

## 2013-03-10 ENCOUNTER — Other Ambulatory Visit: Payer: Self-pay | Admitting: Geriatric Medicine

## 2013-03-10 DIAGNOSIS — S72009S Fracture of unspecified part of neck of unspecified femur, sequela: Secondary | ICD-10-CM

## 2013-03-10 DIAGNOSIS — F329 Major depressive disorder, single episode, unspecified: Secondary | ICD-10-CM

## 2013-03-10 DIAGNOSIS — S72002S Fracture of unspecified part of neck of left femur, sequela: Secondary | ICD-10-CM

## 2013-03-10 DIAGNOSIS — I10 Essential (primary) hypertension: Secondary | ICD-10-CM

## 2013-03-10 DIAGNOSIS — Z8673 Personal history of transient ischemic attack (TIA), and cerebral infarction without residual deficits: Secondary | ICD-10-CM

## 2013-03-10 MED ORDER — HYDROCODONE-ACETAMINOPHEN 5-325 MG PO TABS: 1.0000 | ORAL_TABLET | Freq: Four times a day (QID) | ORAL | Status: DC | PRN

## 2013-03-10 NOTE — Progress Notes (Signed)
Patient ID: Brandy Bailey, female   DOB: [birth year], 79 y.o.   MRN: [id number]  Brandy Bailey living [place]  PCP: Brandy Otto, MD  No Known Allergies  Chief Complaint  Patient presents with  . Discharge Note    HPI:  79 y/o female patient is here for STR and has worked with therapy team and made good progress. She is getting discharged to home with home health services today. She was seen in her room and denies any complaints. No concerns from staff. She had a fall at home and had left iliac wing fracture and left scalp hematoma following which she was admitted to the hospital  Review of Systems:  Denies fever or chills Pain under control No dizziness or lightheadedness No trauma/fall in facility No chest pain or dyspnea No nausea or vomiting or abdominal pain  Past Medical History  Diagnosis Date  . Hypertension   . Osteoarthritis   . Hypercholesteremia   . Type II diabetes mellitus   . Depression    Past Surgical History  Procedure Laterality Date  . Intramedullary (im) nail intertrochanteric Left 10/14/2012    Procedure: INTRAMEDULLARY (IM) NAIL INTERTROCHANTRIC;  Surgeon: Eulas Post, MD;  Location: [facility] OR;  Service: Orthopedics;  Laterality: Left;  . Cataract extraction w/ intraocular lens  implant, bilateral  1999-2004   Social History:   reports that she has never smoked. She has never used smokeless tobacco. She reports that she does not drink alcohol or use illicit drugs.  No family history on file.  Medications: Patient's Medications  New Prescriptions   No medications on file  Previous Medications   ASPIRIN 325 MG TABLET    Take 325 mg by mouth daily.   CALCIUM CARBONATE-VITAMIN D (CALCIUM PLUS VITAMIN D PO)    Take 1 tablet by mouth 2 (two) times daily.   HYDROCODONE-ACETAMINOPHEN (NORCO) 5-325 MG PER TABLET    Take 1-2 tablets by mouth every 6 (six) hours as needed for pain. MAXIMUM TOTAL ACETAMINOPHEN DOSE IS 4000 MG PER DAY   MIRTAZAPINE  (REMERON) 15 MG TABLET    Take 7.5 mg by mouth at bedtime.   SENNOSIDES-DOCUSATE SODIUM (SENOKOT-S) 8.6-50 MG TABLET    Take 1 tablet by mouth daily.   SERTRALINE (ZOLOFT) 100 MG TABLET    Take 100 mg by mouth at bedtime.   SIMVASTATIN (ZOCOR) 40 MG TABLET    Take 40 mg by mouth every evening.  Modified Medications   No medications on file  Discontinued Medications   No medications on file     Physical Exam: Filed Vitals:   03/10/13 1733  BP: 120/50  Pulse: 84  Temp: 98.1 F (36.7 C)  Resp: 18  Height: 5\' 7"  (1.702 m)  Weight: 112 lb (50.803 kg)  SpO2: 98%   gen- elderly thin built lady in NAD HEENT- no pallor, no icterus, no LAD, MMM cvs- ns 1,s2, rrr respi- CTAB Ext- able to move all 4, using a walker here  Labs reviewed: Basic Metabolic Panel:  Recent Labs  91/47/82 0535 02/23/13 1913 02/24/13 0430  NA 139 137 137  K 4.2 4.8 4.3  CL 107 104 105  CO2 25 25 25   GLUCOSE 125* 142* 121*  BUN 19 24* 19  CREATININE 0.61 0.74 0.53  CALCIUM 9.0 9.0 8.7   Liver Function Tests:  Recent Labs  02/21/13 1654  AST 30  ALT 17  ALKPHOS 100  BILITOT 0.6  PROT 7.6  ALBUMIN 3.8   CBC:  Recent Labs  10/13/12 1155  02/21/13 1654  02/23/13 1913 02/24/13 0430 02/25/13 0450  WBC 4.1  < > 8.4  < > 7.6 5.9 5.1  NEUTROABS 3.1  --  7.3  --   --   --   --   HGB 12.7  < > 12.6  < > 10.1* 10.2* 8.5*  HCT 38.8  < > 40.4  < > 31.9* 32.0* 26.8*  MCV 83.6  < > 84.9  < > 85.3 84.9 84.5  PLT 176  < > 151  < > 105* 76* 100*  < > = values in this interval not displayed. Cardiac Enzymes:  Recent Labs  02/21/13 1654 02/21/13 2257 02/22/13 0540  TROPONINI <0.30 <0.30 <0.30    Assessment/Plan  Left iliac wing fracture- will send her home with home PT/OT services. Continue norco prn for pain and continue stool softener  Depression- continue zoloft for now with remeron  HTN- bp well controlled without medication for now. To follow with PCP. Continue statin and  ASA  CVA- bp controlled. Continue asa and statin  Family/ staff Communication: reviewed care plan with patient ad nursing supervisor, answered questions from patient

## 2013-05-14 ENCOUNTER — Encounter (HOSPITAL_COMMUNITY): Payer: Self-pay | Admitting: Emergency Medicine

## 2013-05-14 ENCOUNTER — Emergency Department (INDEPENDENT_AMBULATORY_CARE_PROVIDER_SITE_OTHER)
Admission: EM | Admit: 2013-05-14 | Discharge: 2013-05-14 | Disposition: A | Discharge status: Home / Self Care | Payer: Medicare Other | Source: Home / Self Care | Attending: Family Medicine | Admitting: Family Medicine

## 2013-05-14 DIAGNOSIS — K5900 Constipation, unspecified: Secondary | ICD-10-CM

## 2013-05-14 MED ORDER — POLYETHYLENE GLYCOL 3350 17 G PO PACK: 17.0000 g | PACK | Freq: Every day | ORAL | Status: DC

## 2013-05-14 MED ORDER — BISACODYL 10 MG RE SUPP: 10.0000 mg | RECTAL | Status: DC | PRN

## 2013-05-14 NOTE — ED Notes (Signed)
Pt c/o constipation onset last night Has the urgency but unable to move a bowl... Last bowel movement was 3 days ago Denies: fevers, abd pain Took milk of magnesia q/no relief... Hx of constipation Alert w/no signs of acute distress.

## 2013-05-14 NOTE — ED Provider Notes (Addendum)
Brandy Bailey is a 79 y.o. female who presents to Urgent Care today for constipation. Patient typically has bowel movements every 2 days. Her last bowel movement was about 3 days ago. She felt and heard today for gait yesterday evening but was unable to. She denies any blood in her stool nausea vomiting or diarrhea. She's tried some milk of magnesia which did not help much. She denies any fevers or chills.   Past Medical History  Diagnosis Date  . Hypertension   . Osteoarthritis   . Hypercholesteremia   . Type II diabetes mellitus   . Depression    History  Substance Use Topics  . Smoking status: Never Smoker   . Smokeless tobacco: Never Used  . Alcohol Use: No   ROS as above Medications reviewed. No current facility-administered medications for this encounter.   Current Outpatient Prescriptions  Medication Sig Dispense Refill  . aspirin 325 MG tablet Take 325 mg by mouth daily.      . bisacodyl (DULCOLAX) 10 MG suppository Place 1 suppository (10 mg total) rectally as needed for constipation.  12 suppository  0  . Calcium Carbonate-Vitamin D (CALCIUM PLUS VITAMIN D PO) Take 1 tablet by mouth 2 (two) times daily.      Marland Kitchen HYDROcodone-acetaminophen (NORCO) 5-325 MG per tablet Take 1-2 tablets by mouth every 6 (six) hours as needed for pain. MAXIMUM TOTAL ACETAMINOPHEN DOSE IS 4000 MG PER DAY  240 tablet  5  . mirtazapine (REMERON) 15 MG tablet Take 7.5 mg by mouth at bedtime.      . polyethylene glycol (MIRALAX / GLYCOLAX) packet Take 17 g by mouth daily.  14 each  0  . sennosides-docusate sodium (SENOKOT-S) 8.6-50 MG tablet Take 1 tablet by mouth daily.  30 tablet  1  . sertraline (ZOLOFT) 100 MG tablet Take 100 mg by mouth at bedtime.      . simvastatin (ZOCOR) 40 MG tablet Take 40 mg by mouth every evening.        Exam:  BP 168/84  Pulse 82  Temp(Src) 97.9 F (36.6 C) (Oral)  SpO2 98% Gen: Well NAD, thin HEENT: EOMI,  MMM Lungs: CTABL Nl WOB Heart: RRR no MRG Abd: NABS,  NT, ND Exts: Non edematous BL  LE, warm and well perfused.  Rectal exam: Normal sphincter tone. Firm stool in the vault. Unable to manually disimpact without considerable pain. No masses palpated  Hemoccult was negative  Assessment and Plan: 79 y.o. female with constipation. Patient did not tolerate attempts at manual disimpaction. Plan to use Dulcolax suppository with oral MiraLAX.  Followup with primary care provider Discussed warning signs or symptoms. Please see discharge instructions. Patient expresses understanding.      Rodolph Bong, MD 05/14/13 1610  Rodolph Bong, MD 05/14/13 [phone]

## 2013-05-16 LAB — OCCULT BLOOD, POC DEVICE: Fecal Occult Bld: NEGATIVE

## 2013-08-26 ENCOUNTER — Encounter: Payer: Self-pay | Admitting: Internal Medicine

## 2014-03-29 ENCOUNTER — Ambulatory Visit (INDEPENDENT_AMBULATORY_CARE_PROVIDER_SITE_OTHER): Payer: Medicare Other

## 2014-03-29 DIAGNOSIS — G5600 Carpal tunnel syndrome, unspecified upper limb: Secondary | ICD-10-CM

## 2014-03-29 DIAGNOSIS — G5602 Carpal tunnel syndrome, left upper limb: Secondary | ICD-10-CM

## 2014-03-29 NOTE — Procedures (Signed)
     HISTORY:  Brandy Bailey is an 80 year old patient with a several month history of paresthesias involving the left upper extremity, without neck pain or pain radiating down the left arm. The patient is being evaluated for possible neuropathy.  NERVE CONDUCTION STUDIES:  Nerve conduction studies were performed on both upper extremities. The distal motor latencies and motor amplitudes for the ulnar nerves were normal bilaterally. The distal motor latencies for the median nerves were prolonged on the left, and normal on the right. The F wave latencies and nerve conduction velocities for the median and ulnar nerves were normal bilaterally. The sensory latencies for the median nerves were prolonged on the left, normal the right, and normal for the ulnar nerves bilaterally.  EMG STUDIES:  EMG evaluation was not performed.  IMPRESSION:  Nerve conduction studies done on both upper extremities were notable for a mild left carpal tunnel syndrome. No other significant abnormalities were seen. EMG evaluation was not performed.  Marlan Palau. Keith Willis MD 03/29/2014 4:41 PM  [facility] [address] [place], [place] [id number]  Phone [phone] Fax [phone]

## 2014-07-28 ENCOUNTER — Other Ambulatory Visit: Payer: Self-pay | Admitting: Geriatric Medicine

## 2014-07-28 DIAGNOSIS — E049 Nontoxic goiter, unspecified: Secondary | ICD-10-CM

## 2014-07-31 ENCOUNTER — Ambulatory Visit
Admission: RE | Admit: 2014-07-31 | Discharge: 2014-07-31 | Disposition: A | Discharge status: Home / Self Care | Payer: Medicare Other | Source: Ambulatory Visit | Attending: Geriatric Medicine | Admitting: Geriatric Medicine

## 2014-07-31 ENCOUNTER — Other Ambulatory Visit: Payer: Medicare Other

## 2014-07-31 DIAGNOSIS — E049 Nontoxic goiter, unspecified: Secondary | ICD-10-CM

## 2014-08-23 ENCOUNTER — Emergency Department (HOSPITAL_COMMUNITY)
Admission: EM | Admit: 2014-08-23 | Discharge: 2014-08-23 | Disposition: A | Discharge status: Home / Self Care | Payer: Medicare Other | Attending: Emergency Medicine | Admitting: Emergency Medicine

## 2014-08-23 ENCOUNTER — Encounter (HOSPITAL_COMMUNITY): Payer: Self-pay | Admitting: *Deleted

## 2014-08-23 DIAGNOSIS — Z7982 Long term (current) use of aspirin: Secondary | ICD-10-CM | POA: Insufficient documentation

## 2014-08-23 DIAGNOSIS — E119 Type 2 diabetes mellitus without complications: Secondary | ICD-10-CM | POA: Diagnosis not present

## 2014-08-23 DIAGNOSIS — I10 Essential (primary) hypertension: Secondary | ICD-10-CM | POA: Insufficient documentation

## 2014-08-23 DIAGNOSIS — M542 Cervicalgia: Secondary | ICD-10-CM | POA: Insufficient documentation

## 2014-08-23 DIAGNOSIS — M1990 Unspecified osteoarthritis, unspecified site: Secondary | ICD-10-CM | POA: Insufficient documentation

## 2014-08-23 DIAGNOSIS — E780 Pure hypercholesterolemia: Secondary | ICD-10-CM | POA: Insufficient documentation

## 2014-08-23 DIAGNOSIS — Z79899 Other long term (current) drug therapy: Secondary | ICD-10-CM | POA: Diagnosis not present

## 2014-08-23 DIAGNOSIS — F329 Major depressive disorder, single episode, unspecified: Secondary | ICD-10-CM | POA: Insufficient documentation

## 2014-08-23 MED ORDER — IBUPROFEN 400 MG PO TABS
400.0000 mg | ORAL_TABLET | Freq: Once | ORAL | Status: AC
  Administered 2014-08-23: 400 mg via ORAL
  Filled 2014-08-23: qty 1

## 2014-08-23 MED ORDER — DIAZEPAM 2 MG PO TABS
2.0000 mg | ORAL_TABLET | Freq: Once | ORAL | Status: AC
  Administered 2014-08-23: 2 mg via ORAL
  Filled 2014-08-23: qty 1

## 2014-08-23 MED ORDER — IBUPROFEN 400 MG PO TABS: 400.0000 mg | ORAL_TABLET | Freq: Three times a day (TID) | ORAL | Status: AC

## 2014-08-23 MED ORDER — HYDROCODONE-ACETAMINOPHEN 5-325 MG PO TABS: 1.0000 | ORAL_TABLET | Freq: Four times a day (QID) | ORAL | Status: DC | PRN

## 2014-08-23 MED ORDER — DIAZEPAM 2 MG PO TABS: 2.0000 mg | ORAL_TABLET | Freq: Two times a day (BID) | ORAL | Status: DC

## 2014-08-23 NOTE — ED Provider Notes (Signed)
CSN: [id number]     Arrival date & time 08/23/14  1219 History   First MD Initiated Contact with Patient 08/23/14 1226     Chief Complaint  Patient presents with  . Neck Pain     (Consider location/radiation/quality/duration/timing/severity/associated sxs/prior Treatment) HPI Patient resents with ongoing neck pain.  Over the past month patient has had pain persistently in the right lateral posterior neck.  No sustained relief with aspirin, or other therapy. Patient denies trauma, fall, notable events prior to the onset of pain. Pain is sore, nonradiating, worse with head rotation. No concurrent visual loss, confusion, disorientation, speech deficit. Patient has not seen her physician for the pain yet.  Past Medical History  Diagnosis Date  . Hypertension   . Osteoarthritis   . Hypercholesteremia   . Type II diabetes mellitus   . Depression    Past Surgical History  Procedure Laterality Date  . Intramedullary (im) nail intertrochanteric Left 10/14/2012    Procedure: INTRAMEDULLARY (IM) NAIL INTERTROCHANTRIC;  Surgeon: Eulas PostJoshua P Landau, MD;  Location: [facility] OR;  Service: Orthopedics;  Laterality: Left;  . Cataract extraction w/ intraocular lens  implant, bilateral  1999-2004   History reviewed. No pertinent family history. History  Substance Use Topics  . Smoking status: Never Smoker   . Smokeless tobacco: Never Used  . Alcohol Use: No   OB History    No data available     Review of Systems  Constitutional:       Per HPI, otherwise negative  HENT:       Per HPI, otherwise negative  Respiratory:       Per HPI, otherwise negative  Cardiovascular:       Per HPI, otherwise negative  Gastrointestinal: Negative for vomiting.  Endocrine:       Negative aside from HPI  Genitourinary:       Neg aside from HPI   Musculoskeletal:       Per HPI, otherwise negative  Skin: Negative.   Neurological: Negative for syncope.      Allergies  Review of patient's allergies  indicates no known allergies.  Home Medications   Prior to Admission medications   Medication Sig Start Date End Date Taking? Authorizing Provider  aspirin 325 MG tablet Take 325 mg by mouth daily.    Historical Provider, MD  bisacodyl (DULCOLAX) 10 MG suppository Place 1 suppository (10 mg total) rectally as needed for constipation. 05/14/13   Rodolph BongEvan S Corey, MD  Calcium Carbonate-Vitamin D (CALCIUM PLUS VITAMIN D PO) Take 1 tablet by mouth 2 (two) times daily.    Historical Provider, MD  HYDROcodone-acetaminophen (NORCO) 5-325 MG per tablet Take 1-2 tablets by mouth every 6 (six) hours as needed for pain. MAXIMUM TOTAL ACETAMINOPHEN DOSE IS 4000 MG PER DAY 03/10/13   Sharon SellerJessica K Eubanks, NP  mirtazapine (REMERON) 15 MG tablet Take 7.5 mg by mouth at bedtime.    Historical Provider, MD  polyethylene glycol (MIRALAX / GLYCOLAX) packet Take 17 g by mouth daily. 05/14/13   Rodolph BongEvan S Corey, MD  sennosides-docusate sodium (SENOKOT-S) 8.6-50 MG tablet Take 1 tablet by mouth daily. 10/18/12   Rhetta MuraJai-Gurmukh Samtani, MD  sertraline (ZOLOFT) 100 MG tablet Take 100 mg by mouth at bedtime.    Historical Provider, MD  simvastatin (ZOCOR) 40 MG tablet Take 40 mg by mouth every evening.    Historical Provider, MD   BP 149/65 mmHg  Pulse 70  Temp(Src) 97.5 F (36.4 C) (Oral)  Resp 18  SpO2 100%  Physical Exam  Constitutional: She is oriented to person, place, and time. She appears cachectic. She appears ill. No distress.  HENT:  Head: Normocephalic and atraumatic.  Eyes: Conjunctivae and EOM are normal.  Neck: Muscular tenderness present. No spinous process tenderness present.    Pain with right lateral rotation.  Cardiovascular: Normal rate and regular rhythm.   Pulmonary/Chest: Effort normal and breath sounds normal. No stridor. No respiratory distress.  Abdominal: She exhibits no distension.  Musculoskeletal: She exhibits no edema.  Neurological: She is alert and oriented to person, place, and time. She  displays no atrophy and no tremor. No cranial nerve deficit or sensory deficit. She exhibits normal muscle tone. She displays no seizure activity. Coordination normal.  Skin: Skin is warm and dry.  Psychiatric: She has a normal mood and affect.  Nursing note and vitals reviewed.   ED Course  Procedures (including critical care time) With no neurologic findings, no neurologic complaints, imaging is not indicated at this time.  MDM  Patient presents with one month of right neck pain.  No neurologic findings, no neurologic complaints.  Patient has no sustained therapy for relief thus far, was started on a course of analgesics, muscle relaxants, will follow up with primary care for further evaluation and management. Absent distress, neurologic findings, no indication for imaging at this time.     Gerhard Munch, MD 08/23/14 [phone]

## 2014-08-23 NOTE — ED Notes (Signed)
Pt reports having right side neck pain for months, pain increases with movement.

## 2014-08-23 NOTE — Discharge Instructions (Signed)
As discussed, your evaluation today has been largely reassuring.  But, it is important that you monitor your condition carefully, and do not hesitate to return to the ED if you develop new, or concerning changes in your condition. ? ?Otherwise, please follow-up with your physician for appropriate ongoing care. ? ?

## 2014-12-01 ENCOUNTER — Emergency Department (HOSPITAL_COMMUNITY)
Admission: EM | Admit: 2014-12-01 | Discharge: 2014-12-01 | Disposition: A | Discharge status: Home / Self Care | Payer: Medicare Other | Attending: Emergency Medicine | Admitting: Emergency Medicine

## 2014-12-01 ENCOUNTER — Encounter (HOSPITAL_COMMUNITY): Payer: Self-pay | Admitting: Emergency Medicine

## 2014-12-01 ENCOUNTER — Emergency Department (HOSPITAL_COMMUNITY): Payer: Medicare Other

## 2014-12-01 DIAGNOSIS — Z79899 Other long term (current) drug therapy: Secondary | ICD-10-CM | POA: Insufficient documentation

## 2014-12-01 DIAGNOSIS — Y998 Other external cause status: Secondary | ICD-10-CM | POA: Insufficient documentation

## 2014-12-01 DIAGNOSIS — Y9289 Other specified places as the place of occurrence of the external cause: Secondary | ICD-10-CM | POA: Insufficient documentation

## 2014-12-01 DIAGNOSIS — E780 Pure hypercholesterolemia: Secondary | ICD-10-CM | POA: Diagnosis not present

## 2014-12-01 DIAGNOSIS — M1990 Unspecified osteoarthritis, unspecified site: Secondary | ICD-10-CM | POA: Diagnosis not present

## 2014-12-01 DIAGNOSIS — S46001A Unspecified injury of muscle(s) and tendon(s) of the rotator cuff of right shoulder, initial encounter: Secondary | ICD-10-CM | POA: Diagnosis not present

## 2014-12-01 DIAGNOSIS — S4991XA Unspecified injury of right shoulder and upper arm, initial encounter: Secondary | ICD-10-CM | POA: Diagnosis present

## 2014-12-01 DIAGNOSIS — Y9389 Activity, other specified: Secondary | ICD-10-CM | POA: Diagnosis not present

## 2014-12-01 DIAGNOSIS — F329 Major depressive disorder, single episode, unspecified: Secondary | ICD-10-CM | POA: Diagnosis not present

## 2014-12-01 DIAGNOSIS — E119 Type 2 diabetes mellitus without complications: Secondary | ICD-10-CM | POA: Insufficient documentation

## 2014-12-01 DIAGNOSIS — X58XXXA Exposure to other specified factors, initial encounter: Secondary | ICD-10-CM | POA: Diagnosis not present

## 2014-12-01 DIAGNOSIS — Z7982 Long term (current) use of aspirin: Secondary | ICD-10-CM | POA: Diagnosis not present

## 2014-12-01 DIAGNOSIS — I10 Essential (primary) hypertension: Secondary | ICD-10-CM | POA: Insufficient documentation

## 2014-12-01 MED ORDER — TRAMADOL HCL 50 MG PO TABS
50.0000 mg | ORAL_TABLET | Freq: Once | ORAL | Status: AC
  Administered 2014-12-01: 50 mg via ORAL
  Filled 2014-12-01: qty 1

## 2014-12-01 MED ORDER — TRAMADOL HCL 50 MG PO TABS: ORAL_TABLET | ORAL | Status: DC

## 2014-12-01 NOTE — ED Notes (Signed)
Dr. Knapp at the bedside.  

## 2014-12-01 NOTE — ED Notes (Signed)
Pt. injured her right shoulder this evening while lying on bed " I felt a pop when I turned over" , pain increases with movement  and certain positions . Hypertensive at triage .

## 2014-12-01 NOTE — Discharge Instructions (Signed)
Wear the sling for comfort. If you feel off balance or have difficulty walking with it stop using it. Take the tramadol for pain. Call Dr Greig Right office on Monday to get an appointment to be rechecked next week. Try ice packs to the shoulder for pain.    Rotator Cuff Injury Rotator cuff injury is any type of injury to the set of muscles and tendons that make up the stabilizing unit of your shoulder. This unit holds the ball of your upper arm bone (humerus) in the socket of your shoulder blade (scapula).  CAUSES Injuries to your rotator cuff most commonly come from sports or activities that cause your arm to be moved repeatedly over your head. Examples of this include throwing, weight lifting, swimming, or racquet sports. Long lasting (chronic) irritation of your rotator cuff can cause soreness and swelling (inflammation), bursitis, and eventual damage to your tendons, such as a tear (rupture). SIGNS AND SYMPTOMS Acute rotator cuff tear:  Sudden tearing sensation followed by severe pain shooting from your upper shoulder down your arm toward your elbow.  Decreased range of motion of your shoulder because of pain and muscle spasm.  Severe pain.  Inability to raise your arm out to the side because of pain and loss of muscle power (large tears). Chronic rotator cuff tear:  Pain that usually is worse at night and may interfere with sleep.  Gradual weakness and decreased shoulder motion as the pain worsens.  Decreased range of motion. Rotator cuff tendinitis:  Deep ache in your shoulder and the outside upper arm over your shoulder.  Pain that comes on gradually and becomes worse when lifting your arm to the side or turning it inward. DIAGNOSIS Rotator cuff injury is diagnosed through a medical history, physical exam, and imaging exam. The medical history helps determine the type of rotator cuff injury. Your health care provider will look at your injured shoulder, feel the injured area, and  ask you to move your shoulder in different positions. X-ray exams typically are done to rule out other causes of shoulder pain, such as fractures. MRI is the exam of choice for the most severe shoulder injuries because the images show muscles and tendons.  TREATMENT  Chronic tear:  Medicine for pain, such as acetaminophen or ibuprofen.  Physical therapy and range-of-motion exercises may be helpful in maintaining shoulder function and strength.  Steroid injections into your shoulder joint.  Surgical repair of the rotator cuff if the injury does not heal with noninvasive treatment. Acute tear:  Anti-inflammatory medicines such as ibuprofen and naproxen to help reduce pain and swelling.  A sling to help support your arm and rest your rotator cuff muscles. Long-term use of a sling is not advised. It may cause significant stiffening of the shoulder joint.  Surgery may be considered within a few weeks, especially in younger, active people, to return the shoulder to full function.  Indications for surgical treatment include the following:  Age younger than 60 years.  Rotator cuff tears that are complete.  Physical therapy, rest, and anti-inflammatory medicines have been used for 6-8 weeks, with no improvement.  Employment or sporting activity that requires constant shoulder use. Tendinitis:  Anti-inflammatory medicines such as ibuprofen and naproxen to help reduce pain and swelling.  A sling to help support your arm and rest your rotator cuff muscles. Long-term use of a sling is not advised. It may cause significant stiffening of the shoulder joint.  Severe tendinitis may require:  Steroid injections into your  shoulder joint.  Physical therapy.  Surgery. HOME CARE INSTRUCTIONS   Apply ice to your injury:  Put ice in a plastic bag.  Place a towel between your skin and the bag.  Leave the ice on for 20 minutes, 2-3 times a day.  If you have a shoulder immobilizer (sling and  straps), wear it until told otherwise by your health care provider.  You may want to sleep on several pillows or in a recliner at night to lessen swelling and pain.  Only take over-the-counter or prescription medicines for pain, discomfort, or fever as directed by your health care provider.  Do simple hand squeezing exercises with a soft rubber ball to decrease hand swelling. SEEK MEDICAL CARE IF:   Your shoulder pain increases, or new pain or numbness develops in your arm, hand, or fingers.  Your hand or fingers are colder than your other hand. SEEK IMMEDIATE MEDICAL CARE IF:   Your arm, hand, or fingers are numb or tingling.  Your arm, hand, or fingers are increasingly swollen and painful, or they turn white or blue. MAKE SURE YOU:  Understand these instructions.  Will watch your condition.  Will get help right away if you are not doing well or get worse. Document Released: 07/25/2000 Document Revised: 08/02/2013 Document Reviewed: 03/09/2013 [employer] Patient Information 2015 [employer] [place]. This information is not intended to replace advice given to you by your health care provider. Make sure you discuss any questions you have with your health care provider.

## 2014-12-01 NOTE — ED Provider Notes (Signed)
CSN: [id number]     Arrival date & time 12/01/14  2136 History  This chart was scribed for Devoria AlbeIva Traveon Louro, MD by Bronson CurbJacqueline Melvin, ED Scribe. This patient was seen in room A12C/A12C and the patient's care was started at 11:06 PM.    Chief Complaint  Patient presents with  . Shoulder Injury    The history is provided by the patient and a relative. No language interpreter was used.     HPI Comments: Brandy Bailey is a 81 y.o. female, with history of osteoarthritis, who presents to the Emergency Department complaining of right shoulder injury that occurred yesterday. Patient suspects she "moved it the wrong way". She reports she was standing at the time of injury, but denies any falls or reaching for objects. There is associated sudden onset, constant, 8/10, right shoulder pain. Patient is right hand dominant and is still able to use her right hand, but notes pain when doing so. She denies history of shoulder pain or prior rotator cuff injuries. Patient is ambulatory without walker or cane. She denies any other injuries and has no other complaints at this time.  PCP: Ginette OttoSTONEKING,HAL THOMAS, MD [facility]o: [facility]   Past Medical History  Diagnosis Date  . Hypertension   . Osteoarthritis   . Hypercholesteremia   . Type II diabetes mellitus   . Depression    Past Surgical History  Procedure Laterality Date  . Intramedullary (im) nail intertrochanteric Left 10/14/2012    Procedure: INTRAMEDULLARY (IM) NAIL INTERTROCHANTRIC;  Surgeon: Eulas PostJoshua P Landau, MD;  Location: [facility] OR;  Service: Orthopedics;  Laterality: Left;  . Cataract extraction w/ intraocular lens  implant, bilateral  1999-2004   No family history on file. History  Substance Use Topics  . Smoking status: Never Smoker   . Smokeless tobacco: Never Used  . Alcohol Use: No   Lives at home Lives with daughter  OB History    No data available     Review of Systems  Musculoskeletal: Positive for arthralgias (right shoulder).   All other systems reviewed and are negative.     Allergies  Review of patient's allergies indicates no known allergies.  Home Medications   Prior to Admission medications   Medication Sig Start Date End Date Taking? Authorizing Provider  aspirin 325 MG tablet Take 325 mg by mouth daily.    Historical Provider, MD  bisacodyl (DULCOLAX) 10 MG suppository Place 1 suppository (10 mg total) rectally as needed for constipation. 05/14/13   Rodolph BongEvan S Corey, MD  Calcium Carbonate-Vitamin D (CALCIUM PLUS VITAMIN D PO) Take 1 tablet by mouth 2 (two) times daily.    Historical Provider, MD  diazepam (VALIUM) 2 MG tablet Take 1 tablet (2 mg total) by mouth 2 (two) times daily. 08/23/14   Gerhard Munchobert Lockwood, MD  HYDROcodone-acetaminophen (NORCO) 5-325 MG per tablet Take 1 tablet by mouth every 6 (six) hours as needed for severe pain. MAXIMUM TOTAL ACETAMINOPHEN DOSE IS 4000 MG PER DAY 08/23/14   Gerhard Munchobert Lockwood, MD  mirtazapine (REMERON) 15 MG tablet Take 7.5 mg by mouth at bedtime.    Historical Provider, MD  polyethylene glycol (MIRALAX / GLYCOLAX) packet Take 17 g by mouth daily. 05/14/13   Rodolph BongEvan S Corey, MD  sennosides-docusate sodium (SENOKOT-S) 8.6-50 MG tablet Take 1 tablet by mouth daily. 10/18/12   Rhetta MuraJai-Gurmukh Samtani, MD  sertraline (ZOLOFT) 100 MG tablet Take 100 mg by mouth at bedtime.    Historical Provider, MD  simvastatin (ZOCOR) 40 MG tablet Take  40 mg by mouth every evening.    Historical Provider, MD  traMADol (ULTRAM) 50 MG tablet Take 1 or 2 po Q 6hrs for pain 12/01/14   Devoria Albe, MD   Triage Vitals: BP 188/80 mmHg  Pulse 87  Temp(Src) 99.1 F (37.3 C) (Oral)  Resp 14  Ht  (1.626 m)  Wt 115 lb (52.164 kg)  BMI 19.73 kg/m2  SpO2 98%  Vital signs normal except for hypertension   Physical Exam  Constitutional: She is oriented to person, place, and time. She appears well-developed and well-nourished.  Non-toxic appearance. She does not appear ill. No distress.  Elderly female who  is alert and cooperative  HENT:  Head: Normocephalic and atraumatic.  Right Ear: External ear normal.  Left Ear: External ear normal.  Nose: Nose normal. No mucosal edema or rhinorrhea.  Mouth/Throat: Oropharynx is clear and moist and mucous membranes are normal. No dental abscesses or uvula swelling.  Edentulous  Eyes: Conjunctivae and EOM are normal. Pupils are equal, round, and reactive to light.  Neck: Normal range of motion and full passive range of motion without pain. Neck supple.  Cardiovascular: Normal rate, regular rhythm, normal heart sounds and intact distal pulses.  Exam reveals no gallop and no friction rub.   No murmur heard. Good distal pulses.  Pulmonary/Chest: Effort normal and breath sounds normal. No respiratory distress. She has no wheezes. She has no rhonchi. She has no rales. She exhibits no tenderness and no crepitus.  Abdominal: Soft. Normal appearance and bowel sounds are normal. She exhibits no distension. There is no tenderness. There is no rebound and no guarding.  Musculoskeletal: She exhibits tenderness. She exhibits no edema.  Painful with limited ROM to abduction of right shoulder.  Clavicle nontender.  Good ROM in the elbow and wrist. Good distal pulses   Neurological: She is alert and oriented to person, place, and time. She has normal strength. No cranial nerve deficit.  Skin: Skin is warm, dry and intact. No rash noted. No erythema. No pallor.  Psychiatric: She has a normal mood and affect. Her speech is normal and behavior is normal. Her mood appears not anxious.  Nursing note and vitals reviewed.   ED Course  Procedures (including critical care time)  Medications  traMADol (ULTRAM) tablet 50 mg (50 mg Oral Given 12/01/14 2324)    DIAGNOSTIC STUDIES: Oxygen Saturation is 98% on room air, normal by my interpretation.    COORDINATION OF CARE: At 2312 Discussed treatment plan with patient which includes shoulder sling, tramadol, and ortho f/u.  Patient agrees.   Patient was put in a sling for comfort. However she was advised if this sling makes her feel off balance that she should not use it. She was started on tramadol for pain. Her symptoms are consistent with a rotator cuff problem.  Daughter states Eulah Pont and Thurston Hole did the patient's left hip fracture 2 years ago. [name] also is seeing Dr. Margarita Rana now for a recent hip fracture.  Labs Review Labs Reviewed - No data to display  Imaging Review Dg Shoulder Right  12/01/2014   CLINICAL DATA:  Twisting injury to the right shoulder yesterday, pain  EXAM: RIGHT SHOULDER - 2+ VIEW  COMPARISON:  Shoulder MRI 02/23/2006, right shoulder radiographs 10/15/2005  FINDINGS: Prominent costochondral calcifications are noted projecting over the right hemi thorax. No right proximal humeral fracture or dislocation is identified. Degenerative change at the acromioclavicular joint is noted. Evidence of probable calcific tendinitis noted versus  loose body. Right lung apex is clear. The bones are subjectively osteopenic.  IMPRESSION: No right humeral fracture or dislocation.   Electronically Signed   By: Christiana Pellant M.D.   On: 12/01/2014 22:51     EKG Interpretation None      MDM   Final diagnoses:  Rotator cuff injury, right, initial encounter    Medications  traMADol (ULTRAM) tablet 50 mg (50 mg Oral Given 12/01/14 2324)    Plan discharge   I personally performed the services described in this documentation, which was scribed in my presence. The recorded information has been reviewed and considered.  Devoria Albe, MD, Concha Pyo, MD 12/02/14 [phone]

## 2014-12-15 ENCOUNTER — Inpatient Hospital Stay (HOSPITAL_COMMUNITY)
Admission: EM | Admit: 2014-12-15 | Discharge: 2014-12-18 | DRG: 602 | Disposition: A | Discharge status: Home Health | Payer: Medicare Other | Attending: Internal Medicine | Admitting: Internal Medicine

## 2014-12-15 ENCOUNTER — Encounter (HOSPITAL_COMMUNITY): Payer: Self-pay | Admitting: *Deleted

## 2014-12-15 ENCOUNTER — Emergency Department (HOSPITAL_COMMUNITY): Payer: Medicare Other

## 2014-12-15 DIAGNOSIS — Z79891 Long term (current) use of opiate analgesic: Secondary | ICD-10-CM

## 2014-12-15 DIAGNOSIS — I10 Essential (primary) hypertension: Secondary | ICD-10-CM | POA: Diagnosis present

## 2014-12-15 DIAGNOSIS — L03115 Cellulitis of right lower limb: Principal | ICD-10-CM | POA: Diagnosis present

## 2014-12-15 DIAGNOSIS — L0390 Cellulitis, unspecified: Secondary | ICD-10-CM | POA: Diagnosis present

## 2014-12-15 DIAGNOSIS — Z79899 Other long term (current) drug therapy: Secondary | ICD-10-CM

## 2014-12-15 DIAGNOSIS — Z8673 Personal history of transient ischemic attack (TIA), and cerebral infarction without residual deficits: Secondary | ICD-10-CM

## 2014-12-15 DIAGNOSIS — E43 Unspecified severe protein-calorie malnutrition: Secondary | ICD-10-CM | POA: Insufficient documentation

## 2014-12-15 DIAGNOSIS — F329 Major depressive disorder, single episode, unspecified: Secondary | ICD-10-CM | POA: Diagnosis present

## 2014-12-15 DIAGNOSIS — E119 Type 2 diabetes mellitus without complications: Secondary | ICD-10-CM | POA: Diagnosis present

## 2014-12-15 DIAGNOSIS — Z7982 Long term (current) use of aspirin: Secondary | ICD-10-CM

## 2014-12-15 LAB — COMPREHENSIVE METABOLIC PANEL
ALBUMIN: 2.7 g/dL — AB (ref 3.5–5.0)
ALK PHOS: 89 U/L (ref 38–126)
ALT: 17 U/L (ref 14–54)
ANION GAP: 10 (ref 5–15)
AST: 26 U/L (ref 15–41)
BUN: 11 mg/dL (ref 6–20)
CO2: 27 mmol/L (ref 22–32)
Calcium: 9.4 mg/dL (ref 8.9–10.3)
Chloride: 101 mmol/L (ref 101–111)
Creatinine, Ser: 0.69 mg/dL (ref 0.44–1.00)
GFR calc non Af Amer: >60 mL/min (ref >60)
Glucose, Bld: 136 mg/dL — ABNORMAL HIGH (ref 70–99)
POTASSIUM: 4.7 mmol/L (ref 3.5–5.1)
SODIUM: 138 mmol/L (ref 135–145)
TOTAL PROTEIN: 7.6 g/dL (ref 6.5–8.1)
Total Bilirubin: 1.6 mg/dL — ABNORMAL HIGH (ref 0.3–1.2)

## 2014-12-15 LAB — DIFFERENTIAL
BASOS ABS: 0 10*3/uL (ref 0.0–0.1)
BASOS PCT: 0 % (ref 0–1)
Eosinophils Absolute: 0 10*3/uL (ref 0.0–0.7)
Eosinophils Relative: 0 % (ref 0–5)
Lymphocytes Relative: 9 % — ABNORMAL LOW (ref 12–46)
Lymphs Abs: 0.7 10*3/uL (ref 0.7–4.0)
Monocytes Absolute: 0.8 10*3/uL (ref 0.1–1.0)
Monocytes Relative: 10 % (ref 3–12)
NEUTROS ABS: 6.3 10*3/uL (ref 1.7–7.7)
NEUTROS PCT: 81 % — AB (ref 43–77)

## 2014-12-15 LAB — CBC
HEMATOCRIT: 35.7 % — AB (ref 36.0–46.0)
HEMOGLOBIN: 10.9 g/dL — AB (ref 12.0–15.0)
MCH: 24.9 pg — AB (ref 26.0–34.0)
MCHC: 30.5 g/dL (ref 30.0–36.0)
MCV: 81.5 fL (ref 78.0–100.0)
Platelets: 312 10*3/uL (ref 150–400)
RBC: 4.38 MIL/uL (ref 3.87–5.11)
RDW: 16.6 % — ABNORMAL HIGH (ref 11.5–15.5)
WBC: 7.9 10*3/uL (ref 4.0–10.5)

## 2014-12-15 LAB — PROTIME-INR
INR: 1.18 (ref 0.00–1.49)
PROTHROMBIN TIME: 15.2 s (ref 11.6–15.2)

## 2014-12-15 LAB — BRAIN NATRIURETIC PEPTIDE: B NATRIURETIC PEPTIDE 5: 218.1 pg/mL — AB (ref 0.0–100.0)

## 2014-12-15 LAB — APTT: aPTT: 36 seconds (ref 24–37)

## 2014-12-15 LAB — I-STAT TROPONIN, ED: Troponin i, poc: 0.01 ng/mL (ref 0.00–0.08)

## 2014-12-15 LAB — I-STAT CG4 LACTIC ACID, ED: Lactic Acid, Venous: 1.62 mmol/L (ref 0.5–2.0)

## 2014-12-15 MED ORDER — ENSURE ENLIVE PO LIQD
237.0000 mL | Freq: Two times a day (BID) | ORAL | Status: DC
  Administered 2014-12-16 – 2014-12-18 (×5): 237 mL via ORAL

## 2014-12-15 MED ORDER — SODIUM CHLORIDE 0.9 % IV SOLN: INTRAVENOUS | Status: DC

## 2014-12-15 MED ORDER — ACETAMINOPHEN 650 MG RE SUPP: 650.0000 mg | Freq: Four times a day (QID) | RECTAL | Status: DC | PRN

## 2014-12-15 MED ORDER — MIRTAZAPINE 7.5 MG PO TABS
7.5000 mg | ORAL_TABLET | Freq: Every day | ORAL | Status: DC
  Administered 2014-12-15 – 2014-12-17 (×3): 7.5 mg via ORAL
  Filled 2014-12-15 (×4): qty 1

## 2014-12-15 MED ORDER — HEPARIN SODIUM (PORCINE) 5000 UNIT/ML IJ SOLN
5000.0000 [IU] | Freq: Three times a day (TID) | INTRAMUSCULAR | Status: DC
  Administered 2014-12-15 – 2014-12-18 (×9): 5000 [IU] via SUBCUTANEOUS
  Filled 2014-12-15 (×12): qty 1

## 2014-12-15 MED ORDER — VANCOMYCIN HCL IN DEXTROSE 1-5 GM/200ML-% IV SOLN
1000.0000 mg | Freq: Once | INTRAVENOUS | Status: AC
  Administered 2014-12-15: 1000 mg via INTRAVENOUS
  Filled 2014-12-15: qty 200

## 2014-12-15 MED ORDER — CLINDAMYCIN PHOSPHATE 600 MG/50ML IV SOLN
600.0000 mg | Freq: Three times a day (TID) | INTRAVENOUS | Status: DC
  Administered 2014-12-15 – 2014-12-17 (×7): 600 mg via INTRAVENOUS
  Filled 2014-12-15 (×9): qty 50

## 2014-12-15 MED ORDER — ASPIRIN 325 MG PO TABS
325.0000 mg | ORAL_TABLET | Freq: Every day | ORAL | Status: DC
  Administered 2014-12-16 – 2014-12-18 (×3): 325 mg via ORAL
  Filled 2014-12-15 (×4): qty 1

## 2014-12-15 MED ORDER — ACETAMINOPHEN 325 MG PO TABS: 650.0000 mg | ORAL_TABLET | Freq: Four times a day (QID) | ORAL | Status: DC | PRN

## 2014-12-15 MED ORDER — ACETAMINOPHEN 325 MG PO TABS
650.0000 mg | ORAL_TABLET | Freq: Once | ORAL | Status: AC
  Administered 2014-12-15: 650 mg via ORAL
  Filled 2014-12-15: qty 2

## 2014-12-15 NOTE — ED Notes (Addendum)
Pt reports recently being confused. Having frequent urination, bilateral leg pain and swelling. Family reports pt being confused for months.

## 2014-12-15 NOTE — H&P (Addendum)
Hospitalist Admission History and Physical  Patient name: Brandy Bailey Medical record number: [id number] Date of birth: [birth year] Age: 81 y.o. Gender: female  Primary Care Provider: Ginette OttoSTONEKING,HAL THOMAS, MD  Chief Complaint: cellulitis  History of Present Illness:This is a 81 y.o. year old female with significant past medical history of HTN, NIDDM, depression presenting with R foot cellulitis. Pt reports R foot irritation over past 2-3 weeks. Denies any significant redness, swelling until recently. + mild subjective chills at home.  Presented to ER T 100.3, HR 80s-100s, resp 10s, BP 110s-140s. CBC and BMP WNL. Foot xray w/ soft tissue changes-no concern for osteo.  Lactate 1.6. Started on vanc in ER.   Assessment and Plan: Brandy Bailey is a 81 y.o. year old female presenting with Cellulitis   Active Problems:   Cellulitis of right foot   Cellulitis   1- Cellulitis -IV clindamycin per cellulitis order set-mild disease clinically  -blood cultures -follow   2- HTN -BP stable  -follow  3- NIDDM -SSI -A1C -follow   4- Depression  -mood stable  -cont SSRI  FEN/GI: heart healthy-car modified diet  Prophylaxis: sub q heparin  Disposition: pending further evaluation  Code Status:Full Code    Patient Active Problem List   Diagnosis Date Noted  . Cellulitis of right foot 12/15/2014  . Cellulitis 12/15/2014  . Depression 03/10/2013  . Acute encephalopathy 02/21/2013  . History of CVA (cerebrovascular accident) 02/21/2013  . Acute blood loss anemia 10/15/2012  . Closed left hip fracture 10/13/2012  . Syncope and collapse 10/13/2012  . Diabetes 10/13/2012  . Hypertension 10/13/2012   Past Medical History: Past Medical History  Diagnosis Date  . Hypertension   . Osteoarthritis   . Hypercholesteremia   . Type II diabetes mellitus   . Depression     Past Surgical History: Past Surgical History  Procedure Laterality Date  . Intramedullary (im) nail  intertrochanteric Left 10/14/2012    Procedure: INTRAMEDULLARY (IM) NAIL INTERTROCHANTRIC;  Surgeon: Eulas PostJoshua P Landau, MD;  Location: [facility] OR;  Service: Orthopedics;  Laterality: Left;  . Cataract extraction w/ intraocular lens  implant, bilateral  1999-2004    Social History: History   Social History  . Marital Status: Widowed    Spouse Name: N/A  . Number of Children: N/A  . Years of Education: N/A   Social History Main Topics  . Smoking status: Never Smoker   . Smokeless tobacco: Never Used  . Alcohol Use: No  . Drug Use: No  . Sexual Activity: No   Other Topics Concern  . None   Social History Narrative    Family History: History reviewed. No pertinent family history.  Allergies: No Known Allergies  Current Facility-Administered Medications  Medication Dose Route Frequency Provider Last Rate Last Dose  . 0.9 %  sodium chloride infusion   Intravenous Continuous Floydene FlockSteven J Nadea Kirkland, MD      . acetaminophen (TYLENOL) tablet 650 mg  650 mg Oral Q6H PRN Floydene FlockSteven J Keary Waterson, MD       Or  . acetaminophen (TYLENOL) suppository 650 mg  650 mg Rectal Q6H PRN Floydene FlockSteven J Lovely Kerins, MD      . aspirin tablet 325 mg  325 mg Oral Daily Floydene FlockSteven J Demyan Fugate, MD      . clindamycin (CLEOCIN) IVPB 600 mg  600 mg Intravenous 3 times per day Floydene FlockSteven J Davion Meara, MD      . heparin injection 5,000 Units  5,000 Units Subcutaneous 3 times per day Francoise SchaumannSteven J  Alvester MorinNewton, MD      . mirtazapine (REMERON) tablet 7.5 mg  7.5 mg Oral QHS Floydene FlockSteven J Anaysia Germer, MD      . vancomycin (VANCOCIN) IVPB 1000 mg/200 mL premix  1,000 mg Intravenous Once Jennifer Piepenbrink, PA-C 200 mL/hr at 12/15/14 1626 1,000 mg at 12/15/14 1626   Current Outpatient Prescriptions  Medication Sig Dispense Refill  . aspirin 325 MG tablet Take 325 mg by mouth daily.    . mirtazapine (REMERON) 7.5 MG tablet Take 7.5 mg by mouth at bedtime.  0  . bisacodyl (DULCOLAX) 10 MG suppository Place 1 suppository (10 mg total) rectally as needed for constipation.  (Patient not taking: Reported on 12/15/2014) 12 suppository 0  . diazepam (VALIUM) 2 MG tablet Take 1 tablet (2 mg total) by mouth 2 (two) times daily. (Patient not taking: Reported on 12/15/2014) 10 tablet 0  . HYDROcodone-acetaminophen (NORCO) 5-325 MG per tablet Take 1 tablet by mouth every 6 (six) hours as needed for severe pain. MAXIMUM TOTAL ACETAMINOPHEN DOSE IS 4000 MG PER DAY (Patient not taking: Reported on 12/15/2014) 15 tablet 0  . polyethylene glycol (MIRALAX / GLYCOLAX) packet Take 17 g by mouth daily. (Patient not taking: Reported on 12/15/2014) 14 each 0  . sennosides-docusate sodium (SENOKOT-S) 8.6-50 MG tablet Take 1 tablet by mouth daily. (Patient not taking: Reported on 12/15/2014) 30 tablet 1  . traMADol (ULTRAM) 50 MG tablet Take 1 or 2 po Q 6hrs for pain (Patient not taking: Reported on 12/15/2014) 16 tablet 0   Review Of Systems: 12 point ROS negative except as noted above in HPI.  Physical Exam: Filed Vitals:   12/15/14 1655  BP: 120/57  Pulse: 82  Temp: 99.5 F (37.5 C)  Resp: 16    General: alert and cooperative HEENT: PERRLA and extra ocular movement intact Heart: S1, S2 normal, no murmur, rub or gallop, regular rate and rhythm Lungs: clear to auscultation, no wheezes or rales and unlabored breathing Abdomen: abdomen is soft without significant tenderness, masses, organomegaly or guarding Extremities: R foot swelling and erythema, distal pulses intact    Skin:as above   Neurology: normal without focal findings  Labs and Imaging: Lab Results  Component Value Date/Time   NA 138 12/15/2014 02:10 PM   K 4.7 12/15/2014 02:10 PM   CL 101 12/15/2014 02:10 PM   CO2 27 12/15/2014 02:10 PM   BUN 11 12/15/2014 02:10 PM   CREATININE 0.69 12/15/2014 02:10 PM   GLUCOSE 136* 12/15/2014 02:10 PM   Lab Results  Component Value Date   WBC 7.9 12/15/2014   HGB 10.9* 12/15/2014   HCT 35.7* 12/15/2014   MCV 81.5 12/15/2014   PLT 312 12/15/2014    Dg Foot Complete  Right  12/15/2014   CLINICAL DATA:  Right foot swelling and redness for the past 2 weeks with associated pain radiating into the lower right leg.  EXAM: RIGHT FOOT COMPLETE - 3+ VIEW  COMPARISON:  None.  FINDINGS: Diffuse osteopenia. Diffuse soft tissue swelling. Soft tissue calcifications inferior to the calcaneus. Calcaneal spurs. First MTP joint degenerative changes. No soft tissue gas, bone destruction or periosteal reaction.  IMPRESSION: 1. Diffuse soft tissue swelling without bony changes of osteomyelitis. 2. Degenerative changes, as described above.   Electronically Signed   By: Beckie SaltsSteven  Reid M.D.   On: 12/15/2014 16:16           Doree AlbeeSteven Erlene Devita MD  Pager: [phone]

## 2014-12-15 NOTE — ED Provider Notes (Signed)
CSN: [id number]     Arrival date & time 12/15/14  1346 History   First MD Initiated Contact with Patient 12/15/14 1446     Chief Complaint  Patient presents with  . Altered Mental Status  . Leg Pain     (Consider location/radiation/quality/duration/timing/severity/associated sxs/prior Treatment) HPI Comments: Patient is an 81 year old female past medical history significant for hypertension, osteoarthritis, type 2 diabetes presenting to the emergency department with her daughter for 2 complaints. First complaint is right foot pain, redness and swelling that began a few days ago. Patient endorses associated chills. She has not taken anything at home for pain. Denies any falls or injuries. Second complaint is gradually worsening confusion, the daughter states patient is at her baseline currently without falls or head injury, is looking for nursing home placement. Daughter denies any acute or subacute change in patient's baseline. No modifying factors identified. Denies any chest pain, shortness of breath, syncope, nausea, vomiting, urinary symptoms.  [name] presenting with altered mental status and leg pain.  Altered Mental Status Leg Pain   Past Medical History  Diagnosis Date  . Hypertension   . Osteoarthritis   . Hypercholesteremia   . Type II diabetes mellitus   . Depression    Past Surgical History  Procedure Laterality Date  . Intramedullary (im) nail intertrochanteric Left 10/14/2012    Procedure: INTRAMEDULLARY (IM) NAIL INTERTROCHANTRIC;  Surgeon: Eulas Post, MD;  Location: [facility] OR;  Service: Orthopedics;  Laterality: Left;  . Cataract extraction w/ intraocular lens  implant, bilateral  1999-2004   History reviewed. No pertinent family history. History  Substance Use Topics  . Smoking status: Never Smoker   . Smokeless tobacco: Never Used  . Alcohol Use: No   OB History    No data available     Review of Systems  Musculoskeletal: Positive for  myalgias and arthralgias.  Skin: Positive for color change.  All other systems reviewed and are negative.     Allergies  Review of patient's allergies indicates no known allergies.  Home Medications   Prior to Admission medications   Medication Sig Start Date End Date Taking? Authorizing Provider  aspirin 325 MG tablet Take 325 mg by mouth daily.   Yes Historical Provider, MD  mirtazapine (REMERON) 7.5 MG tablet Take 7.5 mg by mouth at bedtime. 11/29/14  Yes Historical Provider, MD  bisacodyl (DULCOLAX) 10 MG suppository Place 1 suppository (10 mg total) rectally as needed for constipation. Patient not taking: Reported on 12/15/2014 05/14/13   Rodolph Bong, MD  diazepam (VALIUM) 2 MG tablet Take 1 tablet (2 mg total) by mouth 2 (two) times daily. Patient not taking: Reported on 12/15/2014 08/23/14   Gerhard Munch, MD  HYDROcodone-acetaminophen [facility]) 5-325 MG per tablet Take 1 tablet by mouth every 6 (six) hours as needed for severe pain. MAXIMUM TOTAL ACETAMINOPHEN DOSE IS 4000 MG PER DAY Patient not taking: Reported on 12/15/2014 08/23/14   Gerhard Munch, MD  polyethylene glycol [facility] Ethelene Hal) packet Take 17 g by mouth daily. Patient not taking: Reported on 12/15/2014 05/14/13   Rodolph Bong, MD  sennosides-docusate sodium (SENOKOT-S) 8.6-50 MG tablet Take 1 tablet by mouth daily. Patient not taking: Reported on 12/15/2014 10/18/12   Rhetta Mura, MD  traMADol (ULTRAM) 50 MG tablet Take 1 or 2 po Q 6hrs for pain Patient not taking: Reported on 12/15/2014 12/01/14   Devoria Albe, MD   BP 111/73 mmHg  Pulse 103  Temp(Src) 100.3  F (37.9 C) (Rectal)  Resp 17  SpO2 93% Physical Exam  Constitutional: She is oriented to person, place, and time. She appears well-developed and well-nourished. No distress.  HENT:  Head: Normocephalic and atraumatic.  Right Ear: External ear normal.  Left Ear: External ear normal.  Nose: Nose normal.  Mouth/Throat: Oropharynx is clear and moist. No  oropharyngeal exudate.  Eyes: Conjunctivae and EOM are normal. Pupils are equal, round, and reactive to light.  Neck: Normal range of motion. Neck supple.  No nuchal rigidity.   Cardiovascular: Normal rate, regular rhythm, normal heart sounds and intact distal pulses.   Pulmonary/Chest: Effort normal and breath sounds normal. No respiratory distress.  Abdominal: Soft. There is no tenderness.  Musculoskeletal: Normal range of motion.       Right foot: There is tenderness and swelling. There is no deformity and no laceration.  Right foot erythematous, warmth with swelling. Intact DP and PT. Cap refill < 3 sec. Sensation intact.   Neurological: She is alert and oriented to person, place, and time. She has normal strength. No cranial nerve deficit. Gait normal. GCS eye subscore is 4. GCS verbal subscore is 5. GCS motor subscore is 6.  Sensation grossly intact.  No pronator drift.  Bilateral heel-knee-shin intact.  Skin: Skin is warm and dry. She is not diaphoretic. There is erythema.  Psychiatric: She has a normal mood and affect.  Nursing note and vitals reviewed.   ED Course  Procedures (including critical care time) Medications  acetaminophen (TYLENOL) tablet 650 mg (not administered)  vancomycin (VANCOCIN) IVPB 1000 mg/200 mL premix (not administered)    Labs Review Labs Reviewed  CBC - Abnormal; Notable for the following:    Hemoglobin 10.9 (*)    HCT 35.7 (*)    MCH 24.9 (*)    RDW 16.6 (*)    All other components within normal limits  DIFFERENTIAL - Abnormal; Notable for the following:    Neutrophils Relative % 81 (*)    Lymphocytes Relative 9 (*)    All other components within normal limits  COMPREHENSIVE METABOLIC PANEL - Abnormal; Notable for the following:    Glucose, Bld 136 (*)    Albumin 2.7 (*)    Total Bilirubin 1.6 (*)    All other components within normal limits  CULTURE, BLOOD (ROUTINE X 2)  CULTURE, BLOOD (ROUTINE X 2)  PROTIME-INR  APTT  URINALYSIS,  ROUTINE W REFLEX MICROSCOPIC  BRAIN NATRIURETIC PEPTIDE  I-STAT TROPOININ, ED  I-STAT CG4 LACTIC ACID, ED    Imaging Review No results found.   EKG Interpretation   Date/Time:  Friday Dec 15 2014 13:57:37 EDT Ventricular Rate:  97 PR Interval:  124 QRS Duration: 80 QT Interval:  366 QTC Calculation: 464 R Axis:   59 Text Interpretation:  Sinus rhythm with marked sinus arrhythmia with  Premature ventricular complexes or Fusion complexes Nonspecific ST and T  wave abnormality Abnormal ECG Nonspecific ST depression Confirmed by  Manus GunningANCOUR  MD, STEPHEN ([id number]) on 12/15/2014 2:53:19 PM      No acute or subacute change in patient's baseline memory. Family is not concerned for acute change. No neurofocal deficits. They are concerned more for long term placement.   MDM   Final diagnoses:  Cellulitis of right foot    Filed Vitals:   12/15/14 1517  BP:   Pulse:   Temp: 100.3 F (37.9 C)  Resp:    I have reviewed nursing notes, vital signs, and all appropriate lab and imaging  results if ordered as above.   Patient presenting with 2-3 days after right foot pain with redness and warmth. On evaluation DP and PT pulses are intact. Cap refill less than 3 seconds. Sensation intact. Right foot is erythematous and warm to touch. Range of motion is intact without increased pain. No evidence of wounds. No drainage. No neurofocal deficits on examination. Will treat for cellulitis with IV vancomycin. Will admit patient to [facility] for further management and evaluation. Patient d/w with Dr. Manus Gunningancour, agrees with plan.    Francee PiccoloJennifer Taden Witter, PA-C 12/15/14 1705  Glynn OctaveStephen Rancour, MD 12/15/14 [phone]

## 2014-12-16 DIAGNOSIS — E43 Unspecified severe protein-calorie malnutrition: Secondary | ICD-10-CM | POA: Insufficient documentation

## 2014-12-16 DIAGNOSIS — E119 Type 2 diabetes mellitus without complications: Secondary | ICD-10-CM | POA: Diagnosis present

## 2014-12-16 DIAGNOSIS — I10 Essential (primary) hypertension: Secondary | ICD-10-CM | POA: Diagnosis present

## 2014-12-16 DIAGNOSIS — F329 Major depressive disorder, single episode, unspecified: Secondary | ICD-10-CM | POA: Diagnosis present

## 2014-12-16 DIAGNOSIS — Z79899 Other long term (current) drug therapy: Secondary | ICD-10-CM | POA: Diagnosis not present

## 2014-12-16 DIAGNOSIS — Z79891 Long term (current) use of opiate analgesic: Secondary | ICD-10-CM | POA: Diagnosis not present

## 2014-12-16 DIAGNOSIS — Z8673 Personal history of transient ischemic attack (TIA), and cerebral infarction without residual deficits: Secondary | ICD-10-CM | POA: Diagnosis not present

## 2014-12-16 DIAGNOSIS — L03115 Cellulitis of right lower limb: Secondary | ICD-10-CM | POA: Diagnosis present

## 2014-12-16 DIAGNOSIS — Z7982 Long term (current) use of aspirin: Secondary | ICD-10-CM | POA: Diagnosis not present

## 2014-12-16 LAB — CBC WITH DIFFERENTIAL/PLATELET
Basophils Absolute: 0 10*3/uL (ref 0.0–0.1)
Basophils Relative: 0 % (ref 0–1)
Eosinophils Absolute: 0 10*3/uL (ref 0.0–0.7)
Eosinophils Relative: 1 % (ref 0–5)
HCT: 31.1 % — ABNORMAL LOW (ref 36.0–46.0)
Hemoglobin: 9.4 g/dL — ABNORMAL LOW (ref 12.0–15.0)
Lymphocytes Relative: 17 % (ref 12–46)
Lymphs Abs: 0.8 10*3/uL (ref 0.7–4.0)
MCH: 24.8 pg — ABNORMAL LOW (ref 26.0–34.0)
MCHC: 30.2 g/dL (ref 30.0–36.0)
MCV: 82.1 fL (ref 78.0–100.0)
Monocytes Absolute: 0.5 10*3/uL (ref 0.1–1.0)
Monocytes Relative: 11 % (ref 3–12)
NEUTROS PCT: 71 % (ref 43–77)
Neutro Abs: 3.6 10*3/uL (ref 1.7–7.7)
PLATELETS: 269 10*3/uL (ref 150–400)
RBC: 3.79 MIL/uL — ABNORMAL LOW (ref 3.87–5.11)
RDW: 16.8 % — AB (ref 11.5–15.5)
WBC: 5 10*3/uL (ref 4.0–10.5)

## 2014-12-16 LAB — COMPREHENSIVE METABOLIC PANEL
ALT: 23 U/L (ref 14–54)
ANION GAP: 8 (ref 5–15)
AST: 42 U/L — ABNORMAL HIGH (ref 15–41)
Albumin: 2.1 g/dL — ABNORMAL LOW (ref 3.5–5.0)
Alkaline Phosphatase: 92 U/L (ref 38–126)
BILIRUBIN TOTAL: 0.7 mg/dL (ref 0.3–1.2)
BUN: 15 mg/dL (ref 6–20)
CALCIUM: 8.4 mg/dL — AB (ref 8.9–10.3)
CHLORIDE: 102 mmol/L (ref 101–111)
CO2: 28 mmol/L (ref 22–32)
CREATININE: 0.84 mg/dL (ref 0.44–1.00)
GFR calc Af Amer: >60 mL/min (ref >60)
GLUCOSE: 123 mg/dL — AB (ref 70–99)
Potassium: 3.7 mmol/L (ref 3.5–5.1)
Sodium: 138 mmol/L (ref 135–145)
Total Protein: 6.3 g/dL — ABNORMAL LOW (ref 6.5–8.1)

## 2014-12-16 LAB — URINALYSIS, ROUTINE W REFLEX MICROSCOPIC
Bilirubin Urine: NEGATIVE
GLUCOSE, UA: NEGATIVE mg/dL
Hgb urine dipstick: NEGATIVE
Ketones, ur: NEGATIVE mg/dL
LEUKOCYTES UA: NEGATIVE
Nitrite: NEGATIVE
PH: 5 (ref 5.0–8.0)
Protein, ur: 30 mg/dL — AB
Specific Gravity, Urine: 1.011 (ref 1.005–1.030)
Urobilinogen, UA: 1 mg/dL (ref 0.0–1.0)

## 2014-12-16 LAB — URINE MICROSCOPIC-ADD ON

## 2014-12-16 NOTE — Progress Notes (Signed)
Initial Nutrition Assessment  DOCUMENTATION CODES:  Severe malnutrition in context of chronic illness, Underweight  INTERVENTION:  Ensure Enlive (each supplement provides 350kcal and 20 grams of protein)  NUTRITION DIAGNOSIS:  Predicted suboptimal nutrient intake related to other (see comment) (decreased appetite) as evidenced by severe depletion of muscle mass, severe fluid accumulation.   GOAL:  Patient will meet greater than or equal to 90% of their needs   MONITOR:  PO intake, Supplement acceptance, Labs, Weight trends, Skin, I & O's  REASON FOR ASSESSMENT:  Malnutrition Screening Tool    ASSESSMENT: This is a 81 y.o. year old female with significant past medical history of HTN, NIDDM, depression presenting with R foot cellulitis. Pt reports R foot irritation over past 2-3 weeks. Denies any significant redness, swelling until recently. + mild subjective chills at home.   Pt admitted with left leg cellulitis.  She denies any weight loss and reports she has always been a petite person. She reports that she usually doesn't eat much at baseline. She estimates she eats about 2 meals per day. She denies any difficulty chewing and swallowing, despite having no teeth or access to dentures. She reports she has always stayed away from meat, but consumes plenty of yogurt, milk, beans, and cheese for additional protein. She estimated she drinks 1 bottle of Ensure daily on average. Noted completed Ensure bottle at bedside. Pt reports that her appetite is improving and she consumed 100% of her breakfast. She would like to continue Ensure supplements.  Educated on importance of good PO intake to promote healing. Encouraged pt to consume her supplements and eat her meals.  Labs reviewed. Calcium: 8.4.  Height:  Ht Readings from Last 1 Encounters:  12/15/14 5\' 4"  (1.626 m)    Weight:  Wt Readings from Last 1 Encounters:  12/15/14 106 lb 11.2 oz (48.399 kg)    Ideal Body Weight:   54.5 kg  Wt Readings from Last 10 Encounters:  12/15/14 106 lb 11.2 oz (48.399 kg)  12/01/14 115 lb (52.164 kg)  03/10/13 112 lb (50.803 kg)  03/02/13 111 lb (50.349 kg)  02/25/13 112 lb 4.8 oz (50.939 kg)    BMI:  Body mass index is 18.31 kg/(m^2). Underweight  Estimated Nutritional Needs:  Kcal:  1450-1650  Protein:  55-65 grams  Fluid:  1.5-1.7 L  Skin:  Wound (see comment) (lt leg cellulitis)  Diet Order:  Diet heart healthy/carb modified Room service appropriate?: Yes; Fluid consistency:: Thin  EDUCATION NEEDS:  Education needs addressed   Intake/Output Summary (Last 24 hours) at 12/16/14 1506 Last data filed at 12/16/14 1032  Gross per 24 hour  Intake    480 ml  Output      0 ml  Net    480 ml    Last BM:  12/14/14  Lavonne Cass A. Mayford KnifeWilliams, RD, LDN, CDE Pager: [phone] After hours Pager: [phone]

## 2014-12-16 NOTE — Evaluation (Signed)
Physical Therapy Evaluation Patient Details Name: Brandy Bailey MRN: [id number] DOB: [birth year] Today's Date: 12/16/2014   History of Present Illness  This is a 81 y.o. year old female with significant past medical history of HTN, NIDDM, depression presenting with R foot cellulitis. Pt reports R foot irritation over past 2-3 weeks. Denies any significant redness, swelling until recently. + mild subjective chills at home  Clinical Impression  Pt admitted with above diagnosis. Pt currently with functional limitations due to the deficits listed below (see PT Problem List). Pt able to ambulate with RW and knows PT recommends to use hers at home initially for safety.  Pt wants to do Outpt PT for balance training.  MD:  Please order through Epic or give pt prescription for Outpt PT for balance retraining.  Will follow acutely.   Pt will benefit from skilled PT to increase their independence and safety with mobility to allow discharge to the venue listed below.      Follow Up Recommendations Supervision/Assistance - 24 hour;Outpatient PT    Equipment Recommendations  None recommended by PT    Recommendations for Other Services       Precautions / Restrictions Precautions Precautions: Fall Restrictions Weight Bearing Restrictions: No      Mobility  Bed Mobility               General bed mobility comments: pt in chair on arrival.   Transfers Overall transfer level: Needs assistance Equipment used: Rolling walker (2 wheeled) Transfers: Sit to/from Stand Sit to Stand: Min guard         General transfer comment: slightly unsteady reaching for window sill without RW.   Ambulation/Gait Ambulation/Gait assistance: Min guard Ambulation Distance (Feet): 200 Feet Assistive device: Rolling walker (2 wheeled) Gait Pattern/deviations: Step-through pattern;Decreased stride length   Gait velocity interpretation: <1.8 ft/sec, indicative of risk for recurrent falls General Gait  Details: Pt does well with RW with good safety with RW. Unsteady without RW.  Instructed pt to be sure to use RW initially for safety as she has one and pt agrees.    Stairs            Wheelchair Mobility    Modified Rankin (Stroke Patients Only)       Balance Overall balance assessment: Needs assistance;History of Falls Sitting-balance support: No upper extremity supported;Feet supported Sitting balance-Leahy Scale: Good   Postural control: Posterior lean Standing balance support: No upper extremity supported;During functional activity Standing balance-Leahy Scale: Poor Standing balance comment: requires UE support for balance or pt is unsteady on feet.                              Pertinent Vitals/Pain Pain Assessment: No/denies pain  VSS    Home Living Family/patient expects to be discharged to:: Private residence Living Arrangements: Children Available Help at Discharge: Family;Available 24 hours/day Type of Home: House Home Access: Stairs to enter Entrance Stairs-Rails: Right;Left;Can reach both Entrance Stairs-Number of Steps: 4 Home Layout: Two level;Able to live on main level with bedroom/bathroom Home Equipment: Dan HumphreysWalker - 2 wheels;Cane - single point;Shower seat;Bedside commode;Grab bars - tub/shower      Prior Function Level of Independence: Independent               Hand Dominance   Dominant Hand: Right    Extremity/Trunk Assessment   Upper Extremity Assessment: Defer to OT evaluation  Lower Extremity Assessment: Generalized weakness      Cervical / Trunk Assessment: Normal  Communication   Communication: No difficulties  Cognition Arousal/Alertness: Awake/alert Behavior During Therapy: WFL for tasks assessed/performed Overall Cognitive Status: Within Functional Limits for tasks assessed                      General Comments      Exercises General Exercises - Lower Extremity Ankle Circles/Pumps:  AROM;Both;10 reps;Seated Long Arc Quad: AROM;Both;10 reps;Seated Hip Flexion/Marching: AROM;Both;10 reps;Seated      Assessment/Plan    PT Assessment Patient needs continued PT services  PT Diagnosis Generalized weakness   PT Problem List Decreased activity tolerance;Decreased balance;Decreased mobility;Decreased knowledge of use of DME;Decreased safety awareness;Decreased knowledge of precautions  PT Treatment Interventions DME instruction;Gait training;Functional mobility training;Stair training;Therapeutic activities;Balance training;Therapeutic exercise;Patient/family education   PT Goals (Current goals can be found in the Care Plan section) Acute Rehab PT Goals Patient Stated Goal: to go home PT Goal Formulation: With patient Time For Goal Achievement: 12/23/14 Potential to Achieve Goals: Good    Frequency Min 3X/week   Barriers to discharge        Co-evaluation               End of Session Equipment Utilized During Treatment: Gait belt Activity Tolerance: Patient limited by fatigue Patient left: in chair;with call bell/phone within reach;with chair alarm set Nurse Communication: Mobility status    Functional Assessment Tool Used: clinical judgment Functional Limitation: Mobility: Walking and moving around Mobility: Walking and Moving Around Current Status [phone](G8978): At least 1 percent but less than 20 percent impaired, limited or restricted Mobility: Walking and Moving Around Goal Status [phone](G8979): At least 1 percent but less than 20 percent impaired, limited or restricted    Time: 1541-1558 PT Time Calculation (min) (ACUTE ONLY): 17 min   Charges:   PT Evaluation $Initial PT Evaluation Tier I: 1 Procedure     PT G Codes:   PT G-Codes **NOT FOR INPATIENT CLASS** Functional Assessment Tool Used: clinical judgment Functional Limitation: Mobility: Walking and moving around Mobility: Walking and Moving Around Current Status ([phone]([id number]): At least 1 percent but less  than 20 percent impaired, limited or restricted Mobility: Walking and Moving Around Goal Status [phone](G8979): At least 1 percent but less than 20 percent impaired, limited or restricted    Berline LopesWhite, Taylin Leder F 12/16/2014, 4:17 PM [facility] CenterDawn Tyreke Kaeser,PT Acute Rehabilitation [phone] (pager)

## 2014-12-16 NOTE — Progress Notes (Signed)
UR completed 

## 2014-12-16 NOTE — Progress Notes (Signed)
[facility] PROGRESS NOTE  Brandy Bailey ZOX:[id number] DOB: 1927-03-08 DOA: 12/15/2014 PCP: Brandy Bailey  Assessment/Plan:    Cellulitis of right foot:  Denies any recent injury or fall. Improved per patient and daughter. Will consult physical therapy. Continue clindamycin.   Code Status:  full Family Communication:  Daughter at bedside Disposition Plan:  Home 1-2 days if stable  Consultants:    Procedures:     Antibiotics:    HPI/Subjective: Patient denies pain. Daughter reports the swelling is better. No injury.  Objective: Filed Vitals:   12/16/14 1358  BP: 134/64  Pulse: 83  Temp: 98.5 F (36.9 C)  Resp: 16    Intake/Output Summary (Last 24 hours) at 12/16/14 1423 Last data filed at 12/16/14 1032  Gross per 24 hour  Intake    480 ml  Output      0 ml  Net    480 ml   Filed Weights   12/15/14 1723  Weight: 48.399 kg (106 lb 11.2 oz)    Exam:   General:  Frail and elderly. Appears comfortable.  Cardiovascular: Regular rate rhythm without murmurs gallops rubs  Respiratory: Clear to auscultation bilaterally without wheezes rhonchi or rales  Abdomen: Soft nontender nondistended  Ext: Right foot and ankle with mild erythema. Not much warmth. Good range of motion. Fairly swollen still.  Basic Metabolic Panel:  Recent Labs Lab 12/15/14 1410 12/16/14 0716  NA 138 138  K 4.7 3.7  CL 101 102  CO2 27 28  GLUCOSE 136* 123*  BUN 11 15  CREATININE 0.69 0.84  CALCIUM 9.4 8.4*   Liver Function Tests:  Recent Labs Lab 12/15/14 1410 12/16/14 0716  AST 26 42*  ALT 17 23  ALKPHOS 89 92  BILITOT 1.6* 0.7  PROT 7.6 6.3*  ALBUMIN 2.7* 2.1*   No results for input(s): LIPASE, AMYLASE in the last 168 hours. No results for input(s): AMMONIA in the last 168 hours. CBC:  Recent Labs Lab 12/15/14 1410 12/16/14 0716  WBC 7.9 5.0  NEUTROABS 6.3 3.6  HGB 10.9* 9.4*  HCT 35.7* 31.1*  MCV 81.5 82.1  PLT 312 269   Cardiac  Enzymes: No results for input(s): CKTOTAL, CKMB, CKMBINDEX, TROPONINI in the last 168 hours. BNP (last 3 results)  Recent Labs  12/15/14 1700  BNP 218.1*    ProBNP (last 3 results) No results for input(s): PROBNP in the last 8760 hours.  CBG: No results for input(s): GLUCAP in the last 168 hours.  Recent Results (from the past 240 hour(s))  Culture, blood (routine x 2)     Status: None (Preliminary result)   Collection Time: 12/15/14  3:53 PM  Result Value Ref Range Status   Specimen Description BLOOD LEFT FOREARM  Final   Special Requests BOTTLES DRAWN AEROBIC AND ANAEROBIC 5ML  Final   Culture   Final           BLOOD CULTURE RECEIVED NO GROWTH TO DATE CULTURE WILL BE HELD FOR 5 DAYS BEFORE ISSUING A FINAL NEGATIVE REPORT [facility]    Report Status PENDING  Incomplete  Culture, blood (routine x 2)     Status: None (Preliminary result)   Collection Time: 12/15/14  3:54 PM  Result Value Ref Range Status   Specimen Description BLOOD ARM RIGHT  Final   Special Requests BOTTLES DRAWN AEROBIC AND ANAEROBIC 10CC  Final   Culture   Final           BLOOD  CULTURE RECEIVED NO GROWTH TO DATE CULTURE WILL BE HELD FOR 5 DAYS BEFORE ISSUING A FINAL NEGATIVE REPORT Note: Culture results may be compromised due to an excessive volume of blood received in culture bottles. Performed at [facility]    Report Status PENDING  Incomplete     Studies: Dg Foot Complete Right  12/15/2014   CLINICAL DATA:  Right foot swelling and redness for the past 2 weeks with associated pain radiating into the lower right leg.  EXAM: RIGHT FOOT COMPLETE - 3+ VIEW  COMPARISON:  None.  FINDINGS: Diffuse osteopenia. Diffuse soft tissue swelling. Soft tissue calcifications inferior to the calcaneus. Calcaneal spurs. First MTP joint degenerative changes. No soft tissue gas, bone destruction or periosteal reaction.  IMPRESSION: 1. Diffuse soft tissue swelling without bony changes of  osteomyelitis. 2. Degenerative changes, as described above.   Electronically Signed   By: Beckie SaltsSteven  Reid M.D.   On: 12/15/2014 16:16    Scheduled Meds: . aspirin  325 mg Oral Daily  . clindamycin (CLEOCIN) IV  600 mg Intravenous 3 times per day  . feeding supplement (ENSURE ENLIVE)  237 mL Oral BID BM  . heparin  5,000 Units Subcutaneous 3 times per day  . mirtazapine  7.5 mg Oral QHS   Continuous Infusions:   Time spent: 20 minutes  Kassidee Narciso L  [facility] Pager [phone]. If 7PM-7AM, please contact night-coverage at [email or web address], password [email or web address] 12/16/2014, 2:23 PM

## 2014-12-17 LAB — CBC WITH DIFFERENTIAL/PLATELET
Basophils Absolute: 0 10*3/uL (ref 0.0–0.1)
Basophils Relative: 1 % (ref 0–1)
EOS ABS: 0.1 10*3/uL (ref 0.0–0.7)
EOS PCT: 2 % (ref 0–5)
HCT: 30.4 % — ABNORMAL LOW (ref 36.0–46.0)
Hemoglobin: 9.2 g/dL — ABNORMAL LOW (ref 12.0–15.0)
Lymphocytes Relative: 17 % (ref 12–46)
Lymphs Abs: 0.8 10*3/uL (ref 0.7–4.0)
MCH: 24.6 pg — AB (ref 26.0–34.0)
MCHC: 30.3 g/dL (ref 30.0–36.0)
MCV: 81.3 fL (ref 78.0–100.0)
Monocytes Absolute: 0.4 10*3/uL (ref 0.1–1.0)
Monocytes Relative: 8 % (ref 3–12)
NEUTROS PCT: 72 % (ref 43–77)
Neutro Abs: 3.3 10*3/uL (ref 1.7–7.7)
PLATELETS: 280 10*3/uL (ref 150–400)
RBC: 3.74 MIL/uL — ABNORMAL LOW (ref 3.87–5.11)
RDW: 16.8 % — ABNORMAL HIGH (ref 11.5–15.5)
WBC: 4.7 10*3/uL (ref 4.0–10.5)

## 2014-12-17 LAB — COMPREHENSIVE METABOLIC PANEL
ALT: 21 U/L (ref 14–54)
AST: 33 U/L (ref 15–41)
Albumin: 2 g/dL — ABNORMAL LOW (ref 3.5–5.0)
Alkaline Phosphatase: 86 U/L (ref 38–126)
Anion gap: 9 (ref 5–15)
BUN: 21 mg/dL — ABNORMAL HIGH (ref 6–20)
CALCIUM: 8.7 mg/dL — AB (ref 8.9–10.3)
CO2: 25 mmol/L (ref 22–32)
CREATININE: 0.67 mg/dL (ref 0.44–1.00)
Chloride: 104 mmol/L (ref 101–111)
GLUCOSE: 119 mg/dL — AB (ref 70–99)
POTASSIUM: 3.9 mmol/L (ref 3.5–5.1)
Sodium: 138 mmol/L (ref 135–145)
Total Bilirubin: 0.4 mg/dL (ref 0.3–1.2)
Total Protein: 5.7 g/dL — ABNORMAL LOW (ref 6.5–8.1)

## 2014-12-17 MED ORDER — CLINDAMYCIN HCL 300 MG PO CAPS
300.0000 mg | ORAL_CAPSULE | Freq: Three times a day (TID) | ORAL | Status: DC
  Administered 2014-12-17 – 2014-12-18 (×2): 300 mg via ORAL
  Filled 2014-12-17 (×6): qty 1

## 2014-12-17 MED ORDER — ENSURE ENLIVE PO LIQD: 237.0000 mL | Freq: Two times a day (BID) | ORAL | Status: DC

## 2014-12-17 MED ORDER — CLINDAMYCIN HCL 300 MG PO CAPS: 300.0000 mg | ORAL_CAPSULE | Freq: Three times a day (TID) | ORAL | Status: DC

## 2014-12-17 NOTE — Discharge Summary (Addendum)
Physician Discharge Summary  Brandy SaupeClara H Bailey [id number] DOB: 01/31/1927 DOA: 12/15/2014  PCP: Brandy Bailey,Brandy Bailey  Admit date: 12/15/2014 Discharge date: 12/17/2014  Time spent: 35 minutes  Recommendations for Outpatient Follow-up:  1. Finish abx course- return to ER for worsening 2. Home health  Discharge Diagnoses:  Active Problems:   Cellulitis of right foot   Cellulitis   Protein-calorie malnutrition, severe   Discharge Condition: improved  Diet recommendation:   Filed Weights   12/15/14 1723  Weight: 48.399 kg (106 lb 11.2 oz)    History of present illness:  History of Present Illness:This is a 81 y.o. year old female with significant past medical history of HTN, NIDDM, depression presenting with R foot cellulitis. Pt reports R foot irritation over past 2-3 weeks. Denies any significant redness, swelling until recently. + mild subjective chills at home.  Presented to ER T 100.3, HR 80s-100s, resp 10s, BP 110s-140s. CBC and BMP WNL. Foot xray w/ soft tissue changes-no concern for osteo. Lactate 1.6. Started on vanc in ER.    Hospital Course:  Cellulitis of right foot: Denies any recent injury or fall.  Much improved per patient and daughter. Treat with oral clindamycin. Patient wants to go home  Procedures:    Consultations:    Discharge Exam: Filed Vitals:   12/17/14 0528  BP: 152/75  Pulse:   Temp: 98.5 F (36.9 C)  Resp: 15    General: A+Ox3, NAD Cardiovascular: rrr Respiratory: clear Min redness around ankle  Discharge Instructions   Discharge Instructions    Diet - low sodium heart healthy    Complete by:  As directed      Discharge instructions    Complete by:  As directed   24 hour supervision at home     Increase activity slowly    Complete by:  As directed           Current Discharge Medication List    START taking these medications   Details  clindamycin (CLEOCIN) 300 MG capsule Take 1 capsule (300 mg total) by mouth 3  (three) times daily. Qty: 15 capsule, Refills: 0    feeding supplement, ENSURE ENLIVE, (ENSURE ENLIVE) LIQD Take 237 mLs by mouth 2 (two) times daily between meals. Qty: 237 mL, Refills: 12      CONTINUE these medications which have NOT CHANGED   Details  aspirin 325 MG tablet Take 325 mg by mouth daily.    mirtazapine (REMERON) 7.5 MG tablet Take 7.5 mg by mouth at bedtime. Refills: 0      STOP taking these medications     bisacodyl (DULCOLAX) 10 MG suppository      diazepam (VALIUM) 2 MG tablet      HYDROcodone-acetaminophen (NORCO) 5-325 MG per tablet      polyethylene glycol (MIRALAX / GLYCOLAX) packet      sennosides-docusate sodium (SENOKOT-S) 8.6-50 MG tablet      traMADol (ULTRAM) 50 MG tablet        No Known Allergies    The results of significant diagnostics from this hospitalization (including imaging, microbiology, ancillary and laboratory) are listed below for reference.    Significant Diagnostic Studies: Dg Shoulder Right  12/01/2014   CLINICAL DATA:  Twisting injury to the right shoulder yesterday, pain  EXAM: RIGHT SHOULDER - 2+ VIEW  COMPARISON:  Shoulder MRI 02/23/2006, right shoulder radiographs 10/15/2005  FINDINGS: Prominent costochondral calcifications are noted projecting over the right hemi thorax. No right proximal humeral fracture or dislocation is identified.  Degenerative change at the acromioclavicular joint is noted. Evidence of probable calcific tendinitis noted versus loose body. Right lung apex is clear. The bones are subjectively osteopenic.  IMPRESSION: No right humeral fracture or dislocation.   Electronically Signed   By: Christiana Pellant M.D.   On: 12/01/2014 22:51   Dg Foot Complete Right  12/15/2014   CLINICAL DATA:  Right foot swelling and redness for the past 2 weeks with associated pain radiating into the lower right leg.  EXAM: RIGHT FOOT COMPLETE - 3+ VIEW  COMPARISON:  None.  FINDINGS: Diffuse osteopenia. Diffuse soft tissue  swelling. Soft tissue calcifications inferior to the calcaneus. Calcaneal spurs. First MTP joint degenerative changes. No soft tissue gas, bone destruction or periosteal reaction.  IMPRESSION: 1. Diffuse soft tissue swelling without bony changes of osteomyelitis. 2. Degenerative changes, as described above.   Electronically Signed   By: Beckie Salts M.D.   On: 12/15/2014 16:16    Microbiology: Recent Results (from the past 240 hour(s))  Culture, blood (routine x 2)     Status: None (Preliminary result)   Collection Time: 12/15/14  3:53 PM  Result Value Ref Range Status   Specimen Description BLOOD LEFT FOREARM  Final   Special Requests BOTTLES DRAWN AEROBIC AND ANAEROBIC  Final   Culture   Final           BLOOD CULTURE RECEIVED NO GROWTH TO DATE CULTURE WILL BE HELD FOR 5 DAYS BEFORE ISSUING A FINAL NEGATIVE REPORT Performed at [facility]    Report Status PENDING  Incomplete  Culture, blood (routine x 2)     Status: None (Preliminary result)   Collection Time: 12/15/14  3:54 PM  Result Value Ref Range Status   Specimen Description BLOOD ARM RIGHT  Final   Special Requests BOTTLES DRAWN AEROBIC AND ANAEROBIC 10CC  Final   Culture   Final           BLOOD CULTURE RECEIVED NO GROWTH TO DATE CULTURE WILL BE HELD FOR 5 DAYS BEFORE ISSUING A FINAL NEGATIVE REPORT Note: Culture results may be compromised due to an excessive volume of blood received in culture bottles. Performed at [facility]    Report Status PENDING  Incomplete     Labs: Basic Metabolic Panel:  Recent Labs Lab 12/15/14 1410 12/16/14 0716 12/17/14 0615  NA 138 138 138  K 4.7 3.7 3.9  CL 101 102 104  CO2 GLUCOSE 136* 123* 119*  BUN 11 15 21*  CREATININE 0.69 0.84 0.67  CALCIUM 9.4 8.4* 8.7*   Liver Function Tests:  Recent Labs Lab 12/15/14 1410 12/16/14 0716 12/17/14 0615  AST 26 42* 33  ALT ALKPHOS 89 92 86  BILITOT 1.6* 0.7 0.4  PROT 7.6 6.3* 5.7*  ALBUMIN  2.7* 2.1* 2.0*   No results for input(s): LIPASE, AMYLASE in the last 168 hours. No results for input(s): AMMONIA in the last 168 hours. CBC:  Recent Labs Lab 12/15/14 1410 12/16/14 0716 12/17/14 0615  WBC 7.9 5.0 4.7  NEUTROABS 6.3 3.6 3.3  HGB 10.9* 9.4* 9.2*  HCT 35.7* 31.1* 30.4*  MCV 81.5 82.1 81.3  PLT 312 269 280   Cardiac Enzymes: No results for input(s): CKTOTAL, CKMB, CKMBINDEX, TROPONINI in the last 168 hours. BNP: BNP (last 3 results)  Recent Labs  12/15/14 1700  BNP 218.1*    ProBNP (last 3 results) No results for input(s): PROBNP in the last 8760  hours.  CBG: No results for input(s): GLUCAP in the last 168 hours.     SignedMarlin Canary:  Jhoselin Crume  [facility] 12/17/2014, 9:42 AM

## 2014-12-17 NOTE — Progress Notes (Signed)
Since admission, the caregiver has been vomitting frequently with a suspected stomach flu. Out of concern for the caregiver's ability to take care of the pt when the caregiver has a recovering broken leg and vomiting, the case manager was called to speak with the caregiver and pt about home health care. Pt's caregiver talked with case manager and agreed only to home PT. However, the caregiver become increasingly confused and unable to recall what happened to her vehicle. Security was called and no one was able to locate the caregiver's keys (reportedly vallet took them but the caregiver did not have a ticket verifying this). Security also searched for the car that the caregiver described but could not find it despite searching all over [facility]. When security talked with the caregiver, he reported that the caregiver seemed confused and unable to clearly remember what happened to the car. The caregiver then tried many hours to arrange for family members to take them home. The nurse and charge nurse both tried to call family unsuccessfully. Repeatedly, the caregiver tried to wheel the patient off the unit and had to be persuaded not to leave until family was here to pick them up. The confusion of the caregiver seemed to worsen so the charge nurse talked with the caregiver about how risky it was for the caregiver to care for the patient while so sick. The caregiver eventually was persuaded by two nurses to being evaluated in the ED for the vomiting that was reported to be going on for a week. Other family came to pick up patient, but reported they were unable to locate anyone willing to watch the patient overnight while the caregiver was in the ED. Social work was notified about the situation who deemed the discharge unsafe. MD was called.

## 2014-12-17 NOTE — Progress Notes (Signed)
Discharge order placed at 9:41 after patient requested d/c and deemed medically ready for d/c.  Caregiver then decided that she was too sick to take patient home.  Apparently no other family willing to take patient in.  Will consult social worker for SNF.

## 2014-12-17 NOTE — Care Management Note (Addendum)
Case Management Note  Patient Details  Name: Brandy Bailey MRN: [id number] Date of Birth: [birth year]  Subjective/Objective:                   R foot cellulitis Action/Plan:  Discharge planning Expected Discharge Date:                  Expected Discharge Plan:  Home w Home Health Services  In-House Referral:     Discharge planning Services  CM Consult  Post Acute Care Choice:  Home Health Choice offered to:  Patient  DME Arranged:    DME Agency:     HH Arranged:  PT HH Agency:  [facility]  Status of Service:  Completed, signed off  Medicare Important Message Given:    Date Medicare IM Given:    Medicare IM give by:    Date Additional Medicare IM Given:    Additional Medicare Important Message give by:     If discussed at Long Length of Stay Meetings, dates discussed:    Additional Comments: CM spoke with pt to offer choice of home health agency.  Pt chooses [facility] to render HHPT.  No DME needed.  Address and contact information verified by pt. [facility], tiffany.  No other CM needs were communicated.  Yves DillJeffries, Alarik Radu Christine, RN 12/17/2014, 2:44 PM

## 2014-12-18 LAB — CBC WITH DIFFERENTIAL/PLATELET
Basophils Absolute: 0 10*3/uL (ref 0.0–0.1)
Basophils Relative: 0 % (ref 0–1)
Eosinophils Absolute: 0.1 10*3/uL (ref 0.0–0.7)
Eosinophils Relative: 3 % (ref 0–5)
HCT: 28.8 % — ABNORMAL LOW (ref 36.0–46.0)
Hemoglobin: 8.8 g/dL — ABNORMAL LOW (ref 12.0–15.0)
Lymphocytes Relative: 19 % (ref 12–46)
Lymphs Abs: 0.9 10*3/uL (ref 0.7–4.0)
MCH: 24.7 pg — ABNORMAL LOW (ref 26.0–34.0)
MCHC: 30.6 g/dL (ref 30.0–36.0)
MCV: 80.9 fL (ref 78.0–100.0)
Monocytes Absolute: 0.6 10*3/uL (ref 0.1–1.0)
Monocytes Relative: 13 % — ABNORMAL HIGH (ref 3–12)
Neutro Abs: 3 10*3/uL (ref 1.7–7.7)
Neutrophils Relative %: 65 % (ref 43–77)
Platelets: 283 10*3/uL (ref 150–400)
RBC: 3.56 MIL/uL — ABNORMAL LOW (ref 3.87–5.11)
RDW: 16.9 % — ABNORMAL HIGH (ref 11.5–15.5)
WBC: 4.6 10*3/uL (ref 4.0–10.5)

## 2014-12-18 LAB — COMPREHENSIVE METABOLIC PANEL
ALBUMIN: 2.1 g/dL — AB (ref 3.5–5.0)
ALK PHOS: 77 U/L (ref 38–126)
ALT: 21 U/L (ref 14–54)
AST: 30 U/L (ref 15–41)
Anion gap: 7 (ref 5–15)
BUN: 20 mg/dL (ref 6–20)
CALCIUM: 8.8 mg/dL — AB (ref 8.9–10.3)
CO2: 27 mmol/L (ref 22–32)
Chloride: 103 mmol/L (ref 101–111)
Creatinine, Ser: 0.75 mg/dL (ref 0.44–1.00)
GFR calc non Af Amer: >60 mL/min (ref >60)
Glucose, Bld: 107 mg/dL — ABNORMAL HIGH (ref 70–99)
POTASSIUM: 4.5 mmol/L (ref 3.5–5.1)
SODIUM: 137 mmol/L (ref 135–145)
TOTAL PROTEIN: 5.9 g/dL — AB (ref 6.5–8.1)
Total Bilirubin: 0.4 mg/dL (ref 0.3–1.2)

## 2014-12-18 NOTE — Progress Notes (Signed)
Patient had made comment to dietary about wanting to get out of here and asked if she had a gun. RN sat and spoke with patient to assess, and patient said she made a joke about it because she wanted to get out of here. Patient stated " I would never harm myself and that would be an unforgivable sin". Patient did say she would like a spiritual care consult while she is still here for prayer and someone to talk with. Consult ordered. Dr. Benjamine MolaVann notified.

## 2014-12-18 NOTE — Progress Notes (Signed)
[facility] notified of patient discharge today, NCM spoke with Manitou[place]phanie.

## 2014-12-18 NOTE — Progress Notes (Signed)
Chaplain responded to RN consult.  Pt was ready for discharge yesterday but caregiver (pt's daughter) became confused and ill requiring being seen in ED.  Pt's family arrived for pt's discharge but no one able to care for pt upon said discharge so pt required to stay another night.  Pt expresses anger toward family for what she percieves is neglect, "I feel like I've been sat in a chair in the corner and everyone is just walking around me."  Chaplain asked if pt had any desire to harm self or others to which the pt apologized for her comments earlier about wanting a gun and also shared that she believes suicide is "an unforgivable sin," and "would never do anything like that."  Chaplain provided emotional support as well as empathetic listening and will follow up as needed.  Pt's [place] from [place] take [place] home later today.    12/18/14 1000  Clinical Encounter Type  Visited With Patient  Visit Type Initial;Psychological support;Spiritual support  Referral From Nurse  Spiritual Encounters  Spiritual Needs Emotional  Stress Factors  Patient Stress Factors Family relationships;Financial concerns;Loss of control   Blain PaisOvercash, Romone Shaff A, Chaplain 12/18/2014 10:42 AM

## 2014-12-18 NOTE — Progress Notes (Signed)
Brandy Bailey to be D/C'd Home per MD order.  Discussed with the patient and all questions fully answered.  VSS.  An After Visit Summary was printed and given to the patient. Patient received prescription.  D/c education completed with patient/family including follow up instructions, medication list, d/c activities limitations if indicated, with other d/c instructions as indicated by MD - patient able to verbalize understanding, all questions fully answered.   Patient instructed to return to ED, call 911, or call MD for any changes in condition.   Patient escorted via WC, and D/C home via private auto with grandson.   L'ESPERANCE, [name] C 12/18/2014 12:06 PM

## 2014-12-21 LAB — CULTURE, BLOOD (ROUTINE X 2)
Culture: NO GROWTH
Culture: NO GROWTH

## 2015-03-03 ENCOUNTER — Encounter (HOSPITAL_COMMUNITY): Payer: Self-pay | Admitting: Emergency Medicine

## 2015-03-03 ENCOUNTER — Emergency Department (HOSPITAL_COMMUNITY)
Admission: EM | Admit: 2015-03-03 | Discharge: 2015-03-04 | Disposition: A | Discharge status: Home / Self Care | Payer: Medicare Other | Attending: Emergency Medicine | Admitting: Emergency Medicine

## 2015-03-03 ENCOUNTER — Emergency Department (HOSPITAL_COMMUNITY): Payer: Medicare Other

## 2015-03-03 DIAGNOSIS — Z7982 Long term (current) use of aspirin: Secondary | ICD-10-CM | POA: Insufficient documentation

## 2015-03-03 DIAGNOSIS — Z79899 Other long term (current) drug therapy: Secondary | ICD-10-CM | POA: Diagnosis not present

## 2015-03-03 DIAGNOSIS — E119 Type 2 diabetes mellitus without complications: Secondary | ICD-10-CM | POA: Insufficient documentation

## 2015-03-03 DIAGNOSIS — R4182 Altered mental status, unspecified: Secondary | ICD-10-CM | POA: Diagnosis present

## 2015-03-03 DIAGNOSIS — Z792 Long term (current) use of antibiotics: Secondary | ICD-10-CM | POA: Diagnosis not present

## 2015-03-03 DIAGNOSIS — F329 Major depressive disorder, single episode, unspecified: Secondary | ICD-10-CM | POA: Insufficient documentation

## 2015-03-03 DIAGNOSIS — F32A Depression, unspecified: Secondary | ICD-10-CM

## 2015-03-03 DIAGNOSIS — I10 Essential (primary) hypertension: Secondary | ICD-10-CM | POA: Insufficient documentation

## 2015-03-03 DIAGNOSIS — M1990 Unspecified osteoarthritis, unspecified site: Secondary | ICD-10-CM | POA: Diagnosis not present

## 2015-03-03 DIAGNOSIS — T1491XA Suicide attempt, initial encounter: Secondary | ICD-10-CM

## 2015-03-03 LAB — COMPREHENSIVE METABOLIC PANEL
ALT: 9 U/L — AB (ref 14–54)
AST: 18 U/L (ref 15–41)
Albumin: 3.3 g/dL — ABNORMAL LOW (ref 3.5–5.0)
Alkaline Phosphatase: 69 U/L (ref 38–126)
Anion gap: 5 (ref 5–15)
BUN: 16 mg/dL (ref 6–20)
CALCIUM: 9.1 mg/dL (ref 8.9–10.3)
CO2: 28 mmol/L (ref 22–32)
CREATININE: 0.69 mg/dL (ref 0.44–1.00)
Chloride: 104 mmol/L (ref 101–111)
GLUCOSE: 115 mg/dL — AB (ref 65–99)
Potassium: 3.9 mmol/L (ref 3.5–5.1)
Sodium: 137 mmol/L (ref 135–145)
Total Bilirubin: 0.8 mg/dL (ref 0.3–1.2)
Total Protein: 7.9 g/dL (ref 6.5–8.1)

## 2015-03-03 LAB — URINALYSIS, ROUTINE W REFLEX MICROSCOPIC
Bilirubin Urine: NEGATIVE
GLUCOSE, UA: NEGATIVE mg/dL
Hgb urine dipstick: NEGATIVE
Ketones, ur: NEGATIVE mg/dL
Nitrite: NEGATIVE
PROTEIN: 100 mg/dL — AB
SPECIFIC GRAVITY, URINE: 1.022 (ref 1.005–1.030)
Urobilinogen, UA: 0.2 mg/dL (ref 0.0–1.0)
pH: 6 (ref 5.0–8.0)

## 2015-03-03 LAB — CBC WITH DIFFERENTIAL/PLATELET
Basophils Absolute: 0 10*3/uL (ref 0.0–0.1)
Basophils Relative: 0 % (ref 0–1)
EOS ABS: 0 10*3/uL (ref 0.0–0.7)
Eosinophils Relative: 1 % (ref 0–5)
HCT: 38.5 % (ref 36.0–46.0)
Hemoglobin: 12.1 g/dL (ref 12.0–15.0)
LYMPHS ABS: 0.7 10*3/uL (ref 0.7–4.0)
LYMPHS PCT: 16 % (ref 12–46)
MCH: 26.6 pg (ref 26.0–34.0)
MCHC: 31.4 g/dL (ref 30.0–36.0)
MCV: 84.6 fL (ref 78.0–100.0)
Monocytes Absolute: 0.4 10*3/uL (ref 0.1–1.0)
Monocytes Relative: 8 % (ref 3–12)
Neutro Abs: 3.3 10*3/uL (ref 1.7–7.7)
Neutrophils Relative %: 75 % (ref 43–77)
PLATELETS: 168 10*3/uL (ref 150–400)
RBC: 4.55 MIL/uL (ref 3.87–5.11)
RDW: 18 % — ABNORMAL HIGH (ref 11.5–15.5)
WBC: 4.5 10*3/uL (ref 4.0–10.5)

## 2015-03-03 LAB — RAPID URINE DRUG SCREEN, HOSP PERFORMED
AMPHETAMINES: NOT DETECTED
BARBITURATES: NOT DETECTED
BENZODIAZEPINES: NOT DETECTED
COCAINE: NOT DETECTED
Opiates: NOT DETECTED
Tetrahydrocannabinol: NOT DETECTED

## 2015-03-03 LAB — SALICYLATE LEVEL: Salicylate Lvl: <4 mg/dL (ref 2.8–30.0)

## 2015-03-03 LAB — URINE MICROSCOPIC-ADD ON

## 2015-03-03 LAB — ETHANOL

## 2015-03-03 LAB — ACETAMINOPHEN LEVEL: Acetaminophen (Tylenol), Serum: <10 ug/mL — ABNORMAL LOW (ref 10–30)

## 2015-03-03 NOTE — ED Notes (Signed)
Meal tray ordered for patient.

## 2015-03-03 NOTE — ED Notes (Signed)
Spoke to [place] at [facility] concerning IVC paperwork; will speak with psychiatrist

## 2015-03-03 NOTE — ED Provider Notes (Signed)
Patient visit shared. Patient here with SI, recent and suicide attempt. Patient endorses desire to not live in the emergency department. She has been evaluated by TTS. Initially family was on board with going home with home safety plan. On further discussion family does not feel safe with taking the patient home as they cannot be there at all times to monitor her. Patient has been IVCd. Psychiatric evaluation and disposition is pending. Patient has been medically cleared for psychiatric placement.  Tilden Fossa, MD 03/04/15 [phone]

## 2015-03-03 NOTE — ED Notes (Signed)
Dr.Rees will not rescind IVC paperwork tonight; family cannot contract for safety if pt is discharged

## 2015-03-03 NOTE — BH Assessment (Addendum)
Tele Assessment Note   Brandy Bailey is an 81 y.o. female who came to [facility] c/o increased depression and some suicidal thoguts that have been ongoing for about 2 months. Pt states that she feels like her life is over and that she is ready to "move on".  She attempted to end her life by taking a "handful of aspirin" two weeks ago, but she states that nothing happened. She also states that she would feel better if she could go to a nursing home where she would not be a burden to anyone any more. She currently lives with her daughter, who is retired and has been battling some health problems of her own recently because she has diabetes. She has a great deal of family support--multiple children and grandchildren who support and love her according to her and her daughter.  During assessment, pt was calm and cooperative, dressed in paper scrubs. Her mood was depressed with affect congruent to mood. She insisted that she is a bother to others and wants to go to a nursing home, and her daughter states that there is nothing she can do to convince her otherwise, so they will look into it. She denies HI, AVH and history of violence. Her movement, speech and thought process were normal. There was no evidence of responding to internal stimuli, and pt was oriented x4. She has no history of IP or Op treatment other than trying some medication (pt can't remember name) for a few weeks in the past 2 months. Pt's dgtr wonders if the death of the family dog may have anything to do with her feelings, plus, although the family pays all the bills, she worries about finances.  Pt states that she just wants to come home and try to get to a nursing home. Pt's daughter states that she can put all of the medications away and keep her safe and take pt home and to [facility] providers. Dr. Jannifer Franklin recommends Op treatment. Spoke with Dr. Madilyn Hook and she will decide about whether to rescind IVC when she speaks with the family and notify  [facility].  Axis I: Depressive Disorder NOS Axis II: Deferred Axis III:  Past Medical History  Diagnosis Date  . Hypertension   . Osteoarthritis   . Hypercholesteremia   . Type II diabetes mellitus   . Depression    Axis IV: none Axis V: 41-50 serious symptoms  Past Medical History:  Past Medical History  Diagnosis Date  . Hypertension   . Osteoarthritis   . Hypercholesteremia   . Type II diabetes mellitus   . Depression     Past Surgical History  Procedure Laterality Date  . Intramedullary (im) nail intertrochanteric Left 10/14/2012    Procedure: INTRAMEDULLARY (IM) NAIL INTERTROCHANTRIC;  Surgeon: Eulas Post, MD;  Location: [facility] OR;  Service: Orthopedics;  Laterality: Left;  . Cataract extraction w/ intraocular lens  implant, bilateral  1999-2004    Family History: No family history on file.  Social History:  reports that she has never smoked. She has never used smokeless tobacco. She reports that she does not drink alcohol or use illicit drugs.  Additional Social History:  Alcohol / Drug Use Pain Medications: denies Prescriptions: denies Over the Counter: denies History of alcohol / drug use?:  (denies) Longest period of sobriety (when/how long):  (denies) Negative Consequences of Use:  (denies) Withdrawal Symptoms:  (denies)  CIWA: CIWA-Ar BP: 145/68 mmHg Pulse Rate: 83 COWS:    PATIENT STRENGTHS: (choose at least  two) Ability for insight Average or above average intelligence Communication skills General fund of knowledge Supportive family/friends  Allergies: No Known Allergies  Home Medications:  (Not in a hospital admission)  OB/GYN Status:  No LMP recorded. Patient is postmenopausal.  General Assessment Data Location of Assessment: [facility] ED TTS Assessment: In system Is this a Tele or Face-to-Face Assessment?: Tele Assessment Is this an Initial Assessment or a Re-assessment for this encounter?: Initial Assessment Marital status: Widowed [place]  name: Brandy Bailey Is patient pregnant?: No Pregnancy Status: No Living Arrangements: Children Can pt return to current living arrangement?: Yes Admission Status: Voluntary Is patient capable of signing voluntary admission?: Yes Referral Source: Self/Family/Friend Insurance type: [facility]     Crisis Care Plan Living Arrangements: Children Name of Psychiatrist: denies Name of Therapist: denies  Education Status Is patient currently in school?: No  Risk to self with the past 6 months Suicidal Ideation: Yes-Currently Present Has patient been a risk to self within the past 6 months prior to admission? : Yes Suicidal Intent: No Has patient had any suicidal intent within the past 6 months prior to admission? : Yes Is patient at risk for suicide?: Yes Suicidal Plan?: No Has patient had any suicidal plan within the past 6 months prior to admission? : Yes Access to Means: Yes Specify Access to Suicidal Means: pills What has been your use of drugs/alcohol within the last 12 months?: denies Previous Attempts/Gestures: Yes How many times?: 1 Other Self Harm Risks: none known Triggers for Past Attempts:  (depression) Intentional Self Injurious Behavior: None Family Suicide History: No Recent stressful life event(s):  (dgtr in hospital) Persecutory voices/beliefs?: No Depression: Yes Depression Symptoms: Isolating, Fatigue, Guilt, Loss of interest in usual pleasures, Feeling worthless/self pity, Feeling angry/irritable, Insomnia Substance abuse history and/or treatment for substance abuse?: No Suicide prevention information given to non-admitted patients: Yes  Risk to Others within the past 6 months Homicidal Ideation: No Does patient have any lifetime risk of violence toward others beyond the six months prior to admission? : No Thoughts of Harm to Others: No Current Homicidal Intent: No Current Homicidal Plan: No Access to Homicidal Means: No History of harm to others?: No Assessment of  Violence: None Noted Does patient have access to weapons?: No Criminal Charges Pending?: No Does patient have a court date: No Is patient on probation?: No  Psychosis Hallucinations: None noted Delusions: None noted  Mental Status Report Appearance/Hygiene: In scrubs Eye Contact: Good Motor Activity: Unremarkable Speech: Logical/coherent Level of Consciousness: Alert Mood: Depressed, Sad Affect: Depressed, Sad Anxiety Level: Moderate Thought Processes: Coherent, Relevant Judgement: Partial Orientation: Person, Place, Situation, Appropriate for developmental age, Time Obsessive Compulsive Thoughts/Behaviors: Moderate  Cognitive Functioning Concentration: Fair Memory: Recent Impaired, Remote Intact IQ: Average Insight: Fair Impulse Control: Fair Appetite: Poor Weight Loss: 0 Weight Gain: 0 Sleep: Decreased Total Hours of Sleep: 4 Vegetative Symptoms: Decreased grooming  ADLScreening [facility] Assessment Services) Patient's cognitive ability adequate to safely complete daily activities?: No Patient able to express need for assistance with ADLs?: Yes Independently performs ADLs?: Yes (appropriate for developmental age)  Prior Inpatient Therapy Prior Inpatient Therapy: No  Prior Outpatient Therapy Prior Outpatient Therapy: No Does patient have an ACCT team?: No Does patient have Intensive In-House Services?  : No Does patient have [facility] services? : No Does patient have P4CC services?: No  ADL Screening (condition at time of admission) Patient's cognitive ability adequate to safely complete daily activities?: No Is the patient deaf or have difficulty hearing?: No Does  the patient have difficulty seeing, even when wearing glasses/contacts?: Yes Does the patient have difficulty concentrating, remembering, or making decisions?: No Patient able to express need for assistance with ADLs?: Yes Does the patient have difficulty dressing or bathing?: No Independently performs  ADLs?: Yes (appropriate for developmental age) Does the patient have difficulty walking or climbing stairs?: Yes Weakness of Legs: Both  Home Assistive Devices/Equipment Home Assistive Devices/Equipment:  Environmental manager (specify type);rolling)    Abuse/Neglect Assessment (Assessment to be complete while patient is alone) Physical Abuse: Denies Verbal Abuse: Denies Sexual Abuse: Denies Exploitation of patient/patient's resources: Denies Self-Neglect: Denies Values / Beliefs Cultural Requests During Hospitalization: None Spiritual Requests During Hospitalization: None   Advance Directives (For Healthcare) Does patient have an advance directive?: No Would patient like information on creating an advanced directive?: No - patient declined information Nutrition Screen- [facility] Adult/[facility]/AP Patient's home diet:  (Ensure 2x/day) Has the patient recently lost weight without trying?: No Has the patient been eating poorly because of a decreased appetite?: Yes Malnutrition Screening Tool Score: 1  Additional Information 1:1 In Past 12 Months?: No CIRT Risk: No Elopement Risk: No Does patient have medical clearance?: Yes     Disposition:  Disposition Initial Assessment Completed for this Encounter: Yes Disposition of Patient: Other dispositions  [facility] 03/03/2015 6:48 PM

## 2015-03-03 NOTE — ED Notes (Signed)
Dr. Rees at bedside  

## 2015-03-03 NOTE — ED Notes (Signed)
TTS bedside 

## 2015-03-03 NOTE — ED Notes (Signed)
Daughter stated, I had the stomach flu and she was left to take care of herself and shes been real confused and feels weak. Pt. States I just want to lay down and die.

## 2015-03-03 NOTE — ED Provider Notes (Signed)
CSN: [id number]     Arrival date & time 03/03/15  1340 History   First MD Initiated Contact with Patient 03/03/15 1402     Chief Complaint  Patient presents with  . Altered Mental Status  . Weakness     (Consider location/radiation/quality/duration/timing/severity/associated sxs/prior Treatment) HPI   Pt with hx DM, HTN, HLD, depression p/w severe depression and suicide attempt.  Per daughter, pt had a sudden change this week and has become very depressed.  Pt states she is very depressed and "wants to die and go away."  States "I don't have anything left to live for."  Tells her daughter "you don't need me" and states she feels that she is "in the way."  Has asked to go to a nursing home.  States that 3-4 days ago she took "a handful of aspirin" with the intent to kill herself.  Daughter reports that she (the daughter herself) has been sick recently and also that their family dog just died.  Pt denies any physical symptoms at all and notes that physically she feels fine.  States she is eating and drinking well, denies any recent falls.    Past Medical History  Diagnosis Date  . Hypertension   . Osteoarthritis   . Hypercholesteremia   . Type II diabetes mellitus   . Depression    Past Surgical History  Procedure Laterality Date  . Intramedullary (im) nail intertrochanteric Left 10/14/2012    Procedure: INTRAMEDULLARY (IM) NAIL INTERTROCHANTRIC;  Surgeon: Eulas Post, MD;  Location: [facility] OR;  Service: Orthopedics;  Laterality: Left;  . Cataract extraction w/ intraocular lens  implant, bilateral  1999-2004   No family history on file. History  Substance Use Topics  . Smoking status: Never Smoker   . Smokeless tobacco: Never Used  . Alcohol Use: No   OB History    No data available     Review of Systems  All other systems reviewed and are negative.     Allergies  Review of patient's allergies indicates no known allergies.  Home Medications   Prior to Admission  medications   Medication Sig Start Date End Date Taking? Authorizing Provider  aspirin 325 MG tablet Take 325 mg by mouth daily.    Historical Provider, MD  clindamycin (CLEOCIN) 300 MG capsule Take 1 capsule (300 mg total) by mouth 3 (three) times daily. 12/17/14   Joseph Art, DO  feeding supplement, ENSURE ENLIVE, (ENSURE ENLIVE) LIQD Take 237 mLs by mouth 2 (two) times daily between meals. 12/17/14   Joseph Art, DO  mirtazapine (REMERON) 7.5 MG tablet Take 7.5 mg by mouth at bedtime. 11/29/14   Historical Provider, MD   BP 145/68 mmHg  Pulse 83  Temp(Src) 98.2 F (36.8 C) (Oral)  Resp 20  SpO2 97% Physical Exam  Constitutional: She appears well-developed and well-nourished. No distress.  HENT:  Head: Normocephalic and atraumatic.  Neck: Neck supple.  Cardiovascular: Normal rate and regular rhythm.   Pulmonary/Chest: Effort normal and breath sounds normal. No respiratory distress. She has no wheezes. She has no rales.  Abdominal: Soft. She exhibits no distension and no mass. There is no tenderness. There is no rebound and no guarding.  Musculoskeletal: She exhibits no edema.  Neurological: She is alert.  Skin: She is not diaphoretic.  Psychiatric: Her speech is normal and behavior is normal. She exhibits a depressed mood. She expresses suicidal ideation. She expresses suicidal plans.  Nursing note and vitals reviewed.   ED Course  Procedures (including critical care time) Labs Review Labs Reviewed  CBC WITH DIFFERENTIAL/PLATELET - Abnormal; Notable for the following:    RDW 18.0 (*)    All other components within normal limits  URINE CULTURE  COMPREHENSIVE METABOLIC PANEL  URINALYSIS, ROUTINE W REFLEX MICROSCOPIC (NOT AT [facility])  URINE RAPID DRUG SCREEN, HOSP PERFORMED  ETHANOL  ACETAMINOPHEN LEVEL  SALICYLATE LEVEL    Imaging Review Dg Chest 2 View  03/03/2015   CLINICAL DATA:  Confusion.  Hypertension.  EXAM: CHEST  2 VIEW  COMPARISON:  February 21, 2013  FINDINGS:  Lungs are clear. Heart size and pulmonary vascularity are normal. No adenopathy. There is atherosclerotic change in aorta. There is a moderate hiatal hernia. There is degenerative change in the thoracic spine. Bones are osteoporotic. There is stable postoperative change involving the lateral right clavicle.  IMPRESSION: No edema or consolidation.  Moderate hiatal hernia.  No adenopathy.   Electronically Signed   By: Bretta Bang III M.D.   On: 03/03/2015 15:33   Ct Head Wo Contrast  03/03/2015   CLINICAL DATA:  Confusion and weakness today.  Initial encounter.  EXAM: CT HEAD WITHOUT CONTRAST  TECHNIQUE: Contiguous axial images were obtained from the base of the skull through the vertex without intravenous contrast.  COMPARISON:  Brain MRI 02/24/2013.  Head CT scan 02/21/2013.  FINDINGS: There is some chronic microvascular ischemic change. No evidence of acute abnormality including hemorrhage, infarct, mass lesion, mass effect, midline shift or abnormal extra-axial fluid collection is identified. There is no hydrocephalus or pneumocephalus. The calvarium is intact. Carotid atherosclerosis is noted.  IMPRESSION: No acute abnormality.   Electronically Signed   By: Drusilla Kanner M.D.   On: 03/03/2015 15:25     EKG Interpretation None      MDM   Final diagnoses:  Depression  Suicide attempt    Pt p/w depression and suicide attempt 3-4 days ago, taking handful of aspirin.  She has been placed under IVC by Dr Silverio Lay. Pending medical clearance at change of shift.  Signed out to Dr Madilyn Hook who assumes care of the patient.      Trixie Dredge, PA-C 03/03/15 1550  Richardean Canal, MD 03/04/15 [phone]

## 2015-03-03 NOTE — BH Assessment (Addendum)
[facility] Assessment Progress Note Spoke with ED staff and requested them to take machine in room. EDP unavailable to give history of pt.

## 2015-03-03 NOTE — ED Notes (Signed)
Pt in room eating her meal, reports on problems at this time.

## 2015-03-04 LAB — CBG MONITORING, ED: GLUCOSE-CAPILLARY: 111 mg/dL — AB (ref 65–99)

## 2015-03-04 MED ORDER — ACETAMINOPHEN 325 MG PO TABS
650.0000 mg | ORAL_TABLET | ORAL | Status: DC | PRN
  Administered 2015-03-04: 650 mg via ORAL
  Filled 2015-03-04: qty 2

## 2015-03-04 NOTE — ED Notes (Signed)
Brandy Bailey, CM, speaking w/pt.

## 2015-03-04 NOTE — Social Work (Signed)
CSW met with patient in order to assess current needs. Patient was brought to the ED after taking several aspirin. Patient reported that she had taken the aspirin with the intention of committing suicide. Patient continues to identify strong suicidal ideation, "I don't want to be here anymore." Patient states that she is willing to get further psychiatric help and agrees that she needs help, but wants to leave the ED. Patient states that she wants to go home but cannot identify a safety plan to ensure safety in the home. CSW discussed safety issues with patient's daughter and developed a safety plan. [name] stated that she would be able to keep patient safe in the home and that she would lock away all medication including aspirin and tylenol. CSW also recommended that patient start outpatient counseling or treatment. [name] stated that patient had therapy in the past and daughter would seek to follow up with counseling at previous therapist. CSW also recommended that patient's daughter follow up with insurance if she needs to seek another counselor. Daughter agrees with plan and is in route to pick up patient.   Attending physician agrees with discharge.   No further CSW needs.  Christene Lye MSW, [place]

## 2015-03-04 NOTE — Progress Notes (Signed)
CM spoke to pt to ascertain when daughter would be arriving to visit pt. She stated at least 5 times that she wanted to "go home". Said that she took the ASA the other day because she just got "tired of it all". Promised to "never do that again". Referred to CSW.

## 2015-03-04 NOTE — ED Notes (Signed)
Spoke w/pt's daughter, [name], who advised pt only takes Aspirin  prn - does not take daily as directed. States drinks Ensure x 2 daily. Advises she will be in to visit pt at 1230 visiting time.

## 2015-03-04 NOTE — ED Provider Notes (Signed)
  Physical Exam  BP 114/51 mmHg  Pulse 63  Temp(Src) 97.5 F (36.4 C) (Oral)  Resp 16  SpO2 100%  Physical Exam  Constitutional: She appears well-developed and well-nourished. No distress.  HENT:  Head: Normocephalic and atraumatic.  Eyes: Pupils are equal, round, and reactive to light.  Pulmonary/Chest: No respiratory distress.  Skin: She is not diaphoretic.    ED Course  Procedures  MDM Received care of patient at 9 AM. Briefly this is an 12 her old female with a history of diabetes, hypertension, hyperlipidemia who presented with suicidal ideation. Patient reports she took a handful of aspirin days ago due to depression.  She was IVCd by emergency staff and evaluated by TTS.  Behavioral health and family initially felt comfortable with patient going home, however patient's family then reported they would be unable to watch her at all times and did not feel comfortable bringing her home.  Patient is awaiting psychiatry and social work reevaluation.    Patient did have 1 fever overnight to 100.8. There are no signs of pneumonia, no signs of UTI. Family member has a URI. We will continue to monitor.  On my evaluation, she is stable, reporting she would like to go home.  Pt currently awaiting reevaluation.  Patient reports she is on no other home medications than aspirin and nutritional supplement.   Revaluated patient in AM and again in afternoon. Patient continues to deny ongoing suicidal ideation.  Social work and myself discussed with daughter and patient, and daughter now states she has safe plan at home and feels comfortable bringing her home.  Reports she will lock away all of the medications in the home.  Patient repeats that no longer has suicidal thoughts and if they return she will seek health.  Daughter and patient signed contract regarding safe discharge home.  IVC was rescinded given patient cleared by TTS, pt denies SI, and daughter has safe plan and feels comfortable with  taking her home.  Patient discharged in stable condition with understanding of reasons to return.        Alvira Monday, MD 03/04/15 2004

## 2015-03-04 NOTE — ED Notes (Signed)
LEFT MESSAGE FOR SW TO CALL RE: PLAN FOR PT.

## 2015-03-04 NOTE — ED Notes (Addendum)
[place]ynn, SW, spoke w/pt.

## 2015-03-04 NOTE — Discharge Instructions (Signed)
°Emergency Department Resource Guide °1) Find a Doctor and Pay Out of Pocket °Although you won't have to find out who is covered by your insurance plan, it is a good idea to ask around and get recommendations. You will then need to call the office and see if the doctor you have chosen will accept you as a new patient and what types of options they offer for patients who are self-pay. Some doctors offer discounts or will set up payment plans for their patients who do not have insurance, but you will need to ask so you aren't surprised when you get to your appointment. ° °2) Contact Your Local Health Department °Not all health departments have doctors that can see patients for sick visits, but many do, so it is worth a call to see if yours does. If you don't know where your local health department is, you can check in your phone book. The CDC also has a tool to help you locate your state's health department, and many state websites also have listings of all of their local health departments. ° °3) Find a Walk-in Clinic °If your illness is not likely to be very severe or complicated, you may want to try a walk in clinic. These are popping up all over the country in pharmacies, drugstores, and shopping centers. They're usually staffed by nurse practitioners or physician assistants that have been trained to treat common illnesses and complaints. They're usually fairly quick and inexpensive. However, if you have serious medical issues or chronic medical problems, these are probably not your best option. ° °No Primary Care Doctor: °- Call [facility] at  832-8000 - they can help you locate a primary care doctor that  accepts your insurance, provides certain services, etc. °- Physician Referral Service- 1-800-533-3463 ° °Chronic Pain Problems: °Organization         Address  Phone   Notes  °[facility]  (336) 297-2271 Patients need to be referred by their primary care doctor.  ° °Medication  Assistance: °Organization         Address  Phone   Notes  °[place] [facility] [address] °[place], [place] [address] (336) 641-8030 --Must be a resident of [place] °-- Must have NO insurance coverage whatsoever (no Medicaid/ Medicare, etc.) °-- The pt. MUST have a primary care doctor that directs their care regularly and follows them in the community °  °[facility]  (866) 331-1348   °[employer]  (888) 892-1162   ° °Agencies that provide inexpensive medical care: °Organization         Address  Phone   Notes  °[facility]  (336) 832-8035   °[facility]    (336) 832-7272   °[facility] [address] °[place], [place] [address] (336) 832-4777   °[facility] [address], °[place] (336) 271-4999   °[facility]    (336) 373-0678   °[facility]    (336) 272-1050   °[facility] ° [address],  Phone:  (336) 832-4444, Fax:  (336) 832-4440 Hours of Operation:  9 am - 6 pm, M-F.  Also accepts Medicaid/Medicare and self-pay.  °[facility] ° [address],  Phone: (336) 832-3150, Fax: (336) 832-3151. Hours of Operation:  8:30 am - 5:30 pm, M-F.  Also accepts Medicaid and self-pay.  °[facility] [place] [address], [place] Phone: (336) 878-6027   °[facility] [address], [place], [place] (336)723-1848, Ext. 123 Mondays & Thursdays: 7-9 AM.  First 15 patients are seen on a first come, first serve basis. °  ° °Medicaid-accepting [place] Providers: ° °Organization         Address  Phone   Notes  °[facility] [address], [place] (336) 641-2100 Also accepts self-pay patients.  °[facility] [address], [place] ° (336) 856-9996   °[facility] [address], [place]  (336) 288-8857   °[facility] [address] [place] [address], [place] (336) 299-7000   °Veita Bland [address], [place]  ° (336) 373-1557 Only accepts [place] Access Medicaid patients after they have their name applied to their card.  ° °Self-Pay (no insurance) in [place]: ° °Organization         Address  Phone   Notes  °Sickle Cell Patients, [facility] [address], [place] (336) 832-1970   °[facility] [address], [place] (336) 832-4400   ° [facility] ° [address], [place] (336) 992-4800   °[facility]/Dr. Osei-Bonsu ° [address] [place] [address], [place] or [address], [place] (336) 841-8500 Phone number for both [place] and [place] locations is the same.  °[facility] [address], [place] (336) 299-0000   °[facility] [address] [place] [address], [place] or [address] (336) 852-7530 °(336) 878-2260   °[facility] [address], [place] (336) 350-1642, phone; (336) 294-5005, fax Sees patients 1st and 3rd Saturday of every month.  Must not qualify for public or private insurance (i.e. Medicaid, Medicare, [place] Health Choice, Veterans' Benefits) • Household income should be no more than 200% of the poverty level •The clinic cannot treat you if you are pregnant or think you are pregnant • Sexually transmitted diseases are not treated at the clinic.  ° ° °Dental Care: °Organization         Address  Phone  Notes  °[place] [facility] [address], [place] (336) 641-6152 Accepts children up to age 21 who are enrolled in Medicaid or [place] Health Choice; pregnant women with a Medicaid card; and children who have applied for Medicaid or [place] Health Choice, but were declined, whose parents can pay a reduced fee at time of service.  °[place]  [facility] [place]  [address], [place] (336) 641-7733 Accepts children up to age 21 who are enrolled in Medicaid or [place] Health Choice; pregnant women with a Medicaid card; and children who have applied for Medicaid or [place] Health Choice, but were declined, whose parents can pay a reduced fee at time of service.  °[facility] ° [address], [place] (336) 641-4533 Patients are seen by appointment only. Walk-ins are not accepted. [facility] will see patients 18 years of age and older. °Monday - Tuesday (8am-5pm) °Most Wednesdays (8:30-5pm) °$30 per visit, cash only  °[facility] ° [address], [place] (336) 641-4533 Patients are seen by appointment only. Walk-ins are not accepted. [facility] will see patients 18 years of age and older. °One   Wednesday Evening (Monthly: Volunteer Based).  $30 per visit, cash only  °[facility]  (919) 537-3737 for adults; Children under age 4, call Graduate Pediatric Dentistry at ([phone]) [phone]. Children aged 4-14, please call (919) [phone] to request a pediatric application. ° Dental services are provided in all areas of dental care including fillings, crowns and bridges, complete and partial dentures, implants, gum treatment, root canals, and extractions. Preventive care is also provided. Treatment is provided to both adults and children. °Patients are selected via a lottery and there is often a waiting list. °  °[facility] [address], °[place] ° (336) 763-8833 www.drcivils.com °  °[facility] [address], [place], [place] (336)723-1848, Ext. 123 Second and Fourth Thursday of each month, opens at 6:30 AM; Clinic ends at 9 AM.  Patients are seen on a first-come first-served basis, and a limited number are seen during each clinic.  ° °[facility] ° [address], [place], [place] (336) 723-7904    Eligibility Requirements °You must have lived in [place], [place], or [place] counties for at least the last three months. °  You cannot be eligible for state or federal sponsored healthcare insurance, including Veterans Administration, Medicaid, or Medicare. °  You generally cannot be eligible for healthcare insurance through your employer.  °  How to apply: °Eligibility screenings are held every Tuesday and Wednesday afternoon from 1:00 pm until 4:00 pm. You do not need an appointment for the interview!  °[facility] [address], [place], [place] 336-631-2330   °[facility]  336-342-8273   °[facility]  336-703-3100   °[facility]  336-570-6415   ° °Behavioral Health Resources in the Community: °Intensive Outpatient Programs °Organization         Address  Phone  Notes  °[facility] [address], [place], [place] 336-878-6098   °[facility] [address], [place], [place] 336-832-9800   °[facility] [address], [place], [place] ° 336-882-2125   °[facility] [address],  °[place], [place] 1-800-853-5163 or 336-641-4981   °Substance Abuse Resources °Organization         Address  Phone  Notes  °[facility]  336-882-2125   °[facility]  336-784-9470   °[facility]  336-285-9073   °[facility]  336-845-3988   °Residential & Outpatient Substance Abuse Program  1-800-659-3381   °Psychological Services °Organization         Address  Phone  Notes  °[facility]  336- 832-9600   °[facility]  336- 378-7881   °[facility] [address], [place] 1-800-853-5163 or 336-641-4981   ° °Mobile Crisis Teams °Organization         Address  Phone  Notes  °[facility], Mobile Crisis Care Unit  1-877-626-1772   °Assertive °[facility] ° [address]  [place], [place] 336-834-9664   °Sharon DeEsch [address] °[place]  336-554-5454   ° °Self-Help/Support Groups °Organization         Address  Phone             Notes  °[facility] [place] - variety of support groups  336- 373-1402 Call for more information  °Narcotics Anonymous (NA), [facility] [address], °[place]   2 meetings at this location  ° °  Residential Treatment Programs °Organization         Address  Phone  Notes  °[facility] [address],    °[place]  1-866-801-8205   °[facility] ° [address], [place], [place] 704-293-8524   °[facility] [address], [place] 336-845-3988 Admissions: 8am-3pm M-F  °[facility] [address]    °[place], [place] 336-841-1104   °[facility] [address], [place], [place] 336-379-7146   °[facility] [address]  °[place], [place] 336-285-9073   °[facility] - Intensive Outpatient [address], [place], [place] 336-852-3033   °[facility] [address]  °[place], [place] 1-877-615-2722 or 336-784-9470   °[facility] [address] [place], [place] 336-227-7417 Accepts Medicaid  °[facility] [address]  °[place] 1-800-659-3381 Substance Abuse/Addiction Treatment  ° °[place] Behavioral Health Resources °Organization         Address  Phone  Notes  °[facility]  (888) 581-9988   °Julie Brannon, PhD [address] [place], [place]   (336) 349-5553 or (336) 951-0000   °[facility]   [address] °[place], [place] (336) 349-4454   °[facility] [address], [place], [place] (336) 342-8316 Insurance/Medicaid/sponsorship through [facility]  °[facility] [address]                                    [place], [place] (336) 342-8316 Therapy/tele-psych/case    °[facility] [address]  ° , [place] (336) 349-2233    °Dr. Arfeen  (336) 349-4544   °[facility] [place]  [employer] [place] [facility] 1) [address],  °2) [address], [place] °3)  [address], [place] (336) 349-3220 °(336) 342-7768 ° °(336) 342-8140   °[place] [facility] (336) 342-1394 or (336) 342-3537 (After Hours)    ° ° ° °Depression °Depression refers to feeling sad, low, down in the dumps, blue, gloomy, or empty. In general, there are two kinds of depression: °1. Normal sadness or normal grief. This kind of depression is one that we all feel from time to time after upsetting life experiences, such as the loss of a job or the ending of a relationship. This kind of depression is considered normal, is short lived, and resolves within a few days to 2 weeks. Depression experienced after the loss of a loved one (bereavement) often lasts longer than 2 weeks but normally gets better with time. °2. Clinical depression. This kind of depression lasts longer than normal sadness or normal grief or interferes with your ability to function at home, at work, and in school. It also interferes with your personal relationships. It affects almost every aspect of your life. Clinical depression is an illness. °Symptoms of depression can also be caused by conditions other than those mentioned above, such as: °· Physical illness. Some physical illnesses, including underactive thyroid gland (hypothyroidism), severe anemia, specific types of cancer, diabetes, uncontrolled seizures, heart and lung problems, strokes, and chronic pain are commonly associated with symptoms of depression. °· Side effects of some prescription medicine. In some people, certain types of medicine can cause symptoms of depression. °· Substance abuse. Abuse of alcohol and illicit drugs can cause   symptoms of depression. °SYMPTOMS °Symptoms of normal sadness and normal grief include  the following: °· Feeling sad or crying for short periods of time. °· Not caring about anything (apathy). °· Difficulty sleeping or sleeping too much. °· No longer able to enjoy the things you used to enjoy. °· Desire to be by oneself all the time (social isolation). °· Lack of energy or motivation. °· Difficulty concentrating or remembering. °· Change in appetite or weight. °· Restlessness or agitation. °Symptoms of clinical depression include the same symptoms of normal sadness or normal grief and also the following symptoms: °· Feeling sad or crying all the time. °· Feelings of guilt or worthlessness. °· Feelings of hopelessness or helplessness. °· Thoughts of suicide or the desire to harm yourself (suicidal ideation). °· Loss of touch with reality (psychotic symptoms). Seeing or hearing things that are not real (hallucinations) or having false beliefs about your life or the people around you (delusions and paranoia). °DIAGNOSIS  °The diagnosis of clinical depression is usually based on how bad the symptoms are and how long they have lasted. Your health care provider will also ask you questions about your medical history and substance use to find out if physical illness, use of prescription medicine, or substance abuse is causing your depression. Your health care provider may also order blood tests. °TREATMENT  °Often, normal sadness and normal grief do not require treatment. However, sometimes antidepressant medicine is given for bereavement to ease the depressive symptoms until they resolve. °The treatment for clinical depression depends on how bad the symptoms are but often includes antidepressant medicine, counseling with a mental health professional, or both. Your health care provider will help to determine what treatment is best for you. °Depression caused by physical illness usually goes away with appropriate medical treatment of the illness. If prescription medicine is causing depression, talk with your  health care provider about stopping the medicine, decreasing the dose, or changing to another medicine. °Depression caused by the abuse of alcohol or illicit drugs goes away when you stop using these substances. Some adults need professional help in order to stop drinking or using drugs. °SEEK IMMEDIATE MEDICAL CARE IF: °· You have thoughts about hurting yourself or others. °· You lose touch with reality (have psychotic symptoms). °· You are taking medicine for depression and have a serious side effect. °FOR MORE INFORMATION °· National Alliance on Mental Illness: www.nami.org  °· National Institute of Mental Health: www.nimh.nih.gov  °Document Released: 07/25/2000 Document Revised: 12/12/2013 Document Reviewed: 10/27/2011 °ExitCare® Patient Information ©2015 [employer]. This information is not intended to replace advice given to you by your health care provider. Make sure you discuss any questions you have with your health care provider. ° °

## 2015-03-04 NOTE — ED Notes (Signed)
Advised Dr Dalene Seltzer of pt's meds and that SW and CM are assisting w/pt's care plan. Jeannie, CM, aware pt's daughter will be arriving at 1230 visitation time.

## 2015-03-04 NOTE — ED Notes (Signed)
Pt states she feels much better. Requesting to go home. States she has no plans in harming herself.

## 2015-03-04 NOTE — ED Notes (Signed)
Jeannie, CM, advised she spoke w/[place], SW re: pt.

## 2015-03-04 NOTE — ED Notes (Signed)
Brandy Bailey, [facility], advised pt has been cleared from psychiatric standpoint and does not need another psych assessment/eval.

## 2015-03-04 NOTE — ED Notes (Signed)
Jenita Seashore, SW, [facility] Dr Dalene Seltzer requesting for psych to re-eval d/t daughter stated last pm that she felt she was unable to watch pt around-the-clock to ensure she does not get into any medications to harm herself with. Simonne Come advised he spoke [place], SW, [facility] re: pt. This RN spoke w/Tywnn also re: plan for pt - [phone].

## 2015-03-04 NOTE — ED Notes (Signed)
PT AND DAUGHTER SIGNED "NO HARM CONTRACT" - COPY GIVEN. VOICED UNDERSTANDING OF TX AND FOLLOW UP PLAN.

## 2015-03-04 NOTE — ED Notes (Signed)
Tywnn, SW, advising Dr Tomma Lightning.

## 2015-03-04 NOTE — ED Notes (Signed)
Meal tray ordered 

## 2015-03-04 NOTE — ED Notes (Signed)
Tywnn, SW spoke w/pt's daughter - who advised she is on her way to pick up pt and bring her home. Pt also spoke w/daughter.

## 2015-03-05 LAB — URINE CULTURE

## 2015-03-23 ENCOUNTER — Emergency Department (HOSPITAL_COMMUNITY)
Admission: EM | Admit: 2015-03-23 | Discharge: 2015-03-24 | Disposition: A | Discharge status: Home / Self Care | Payer: Medicare Other | Attending: Emergency Medicine | Admitting: Emergency Medicine

## 2015-03-23 ENCOUNTER — Encounter (HOSPITAL_COMMUNITY): Payer: Self-pay | Admitting: *Deleted

## 2015-03-23 ENCOUNTER — Emergency Department (HOSPITAL_COMMUNITY): Payer: Medicare Other

## 2015-03-23 DIAGNOSIS — I10 Essential (primary) hypertension: Secondary | ICD-10-CM | POA: Insufficient documentation

## 2015-03-23 DIAGNOSIS — R4182 Altered mental status, unspecified: Secondary | ICD-10-CM | POA: Diagnosis present

## 2015-03-23 DIAGNOSIS — E119 Type 2 diabetes mellitus without complications: Secondary | ICD-10-CM | POA: Insufficient documentation

## 2015-03-23 DIAGNOSIS — F329 Major depressive disorder, single episode, unspecified: Secondary | ICD-10-CM | POA: Insufficient documentation

## 2015-03-23 DIAGNOSIS — F32A Depression, unspecified: Secondary | ICD-10-CM

## 2015-03-23 DIAGNOSIS — Z7982 Long term (current) use of aspirin: Secondary | ICD-10-CM | POA: Insufficient documentation

## 2015-03-23 DIAGNOSIS — Z79899 Other long term (current) drug therapy: Secondary | ICD-10-CM | POA: Diagnosis not present

## 2015-03-23 DIAGNOSIS — M1990 Unspecified osteoarthritis, unspecified site: Secondary | ICD-10-CM | POA: Diagnosis not present

## 2015-03-23 LAB — URINALYSIS, ROUTINE W REFLEX MICROSCOPIC
Bilirubin Urine: NEGATIVE
Glucose, UA: NEGATIVE mg/dL
Hgb urine dipstick: NEGATIVE
Ketones, ur: NEGATIVE mg/dL
LEUKOCYTES UA: NEGATIVE
Nitrite: NEGATIVE
PH: 5 (ref 5.0–8.0)
Protein, ur: 100 mg/dL — AB
Specific Gravity, Urine: 1.024 (ref 1.005–1.030)
Urobilinogen, UA: 0.2 mg/dL (ref 0.0–1.0)

## 2015-03-23 LAB — BASIC METABOLIC PANEL
Anion gap: 7 (ref 5–15)
BUN: 18 mg/dL (ref 6–20)
CO2: 25 mmol/L (ref 22–32)
Calcium: 8.7 mg/dL — ABNORMAL LOW (ref 8.9–10.3)
Chloride: 108 mmol/L (ref 101–111)
Creatinine, Ser: 0.74 mg/dL (ref 0.44–1.00)
Glucose, Bld: 109 mg/dL — ABNORMAL HIGH (ref 65–99)
POTASSIUM: 4.1 mmol/L (ref 3.5–5.1)
Sodium: 140 mmol/L (ref 135–145)

## 2015-03-23 LAB — CBC
HCT: 36.3 % (ref 36.0–46.0)
HEMOGLOBIN: 11 g/dL — AB (ref 12.0–15.0)
MCH: 25.8 pg — ABNORMAL LOW (ref 26.0–34.0)
MCHC: 30.3 g/dL (ref 30.0–36.0)
MCV: 85.2 fL (ref 78.0–100.0)
Platelets: 182 10*3/uL (ref 150–400)
RBC: 4.26 MIL/uL (ref 3.87–5.11)
RDW: 17.9 % — AB (ref 11.5–15.5)
WBC: 3.7 10*3/uL — AB (ref 4.0–10.5)

## 2015-03-23 LAB — URINE MICROSCOPIC-ADD ON

## 2015-03-23 LAB — CBG MONITORING, ED: Glucose-Capillary: 76 mg/dL (ref 65–99)

## 2015-03-23 MED ORDER — ZOLPIDEM TARTRATE 5 MG PO TABS
5.0000 mg | ORAL_TABLET | Freq: Every evening | ORAL | Status: DC | PRN
  Administered 2015-03-23: 5 mg via ORAL
  Filled 2015-03-23: qty 1

## 2015-03-23 MED ORDER — ENSURE ENLIVE PO LIQD
237.0000 mL | Freq: Once | ORAL | Status: AC
  Administered 2015-03-23: 237 mL via ORAL
  Filled 2015-03-23 (×2): qty 237

## 2015-03-23 MED ORDER — ONDANSETRON HCL 4 MG PO TABS: 4.0000 mg | ORAL_TABLET | Freq: Three times a day (TID) | ORAL | Status: DC | PRN

## 2015-03-23 MED ORDER — ALUM & MAG HYDROXIDE-SIMETH 200-200-20 MG/5ML PO SUSP: 30.0000 mL | ORAL | Status: DC | PRN

## 2015-03-23 MED ORDER — LORAZEPAM 1 MG PO TABS: 1.0000 mg | ORAL_TABLET | Freq: Three times a day (TID) | ORAL | Status: DC | PRN

## 2015-03-23 NOTE — ED Notes (Signed)
The pt has been confused for the past month.  She has been falling more also.  Her speech has changed also.  At present she is alert oriented  X4.  alert

## 2015-03-23 NOTE — Discharge Instructions (Signed)

## 2015-03-23 NOTE — ED Notes (Signed)
Called Pharmacy to receive vanilla ensure.

## 2015-03-23 NOTE — ED Notes (Signed)
TTS at the bedside. 

## 2015-03-23 NOTE — ED Notes (Signed)
Pt placed in wine paper scrubs. Belongings given to daughter. Security wanded pt.

## 2015-03-23 NOTE — ED Notes (Signed)
Pt asking for sleeping meds

## 2015-03-23 NOTE — ED Notes (Signed)
Staffing and Charge are aware of patient's suicidal ideations. Family at the bedside at this time.

## 2015-03-23 NOTE — BH Assessment (Addendum)
Tele Assessment Note   Brandy Bailey is an 81 y.o. female, white, widowed who presents unaccompanied to [facility] ED reporting symptoms of depression. Pt reports she has a history of depression and recent falls, which is why she says she is presenting. Pt states she "feels like my brain isn't working right" but has difficulty explaining her symptoms other than feeling depressed and not herself. She reports symptoms including decreased concentration, "feeling sorry for myself", social withdrawal, loss of interest in usual pleasures, decreased sleep, weight loss and feelings of sadness, hopelessness and frustration. Pt denies current suicidal plan or intent but states she would like to "go to sleep and wake up in heaven." Pt identifies her religious faith as a deterrent to suicide. She reports one previous suicide attempt in July 2016 when she took a handful of aspirin. At that time she was assessed in the ED and discharged to outpatient treatment. Pt denies any family history of suicide. Pt denies any current homicidal ideation or history of violence. Pt denies auditory or visual hallucinations. She denies any history of alcohol, tobacco or substance abuse.   Pt reports she lives with her adult daughter. She reports she had three daughters and only one is still living. She also has support through her [facility], which she attends weekly. Pt states she thinks about the past and can't see a future due to her age. She reports she is independent with all ADLs. She denies any history of inpatient psychiatric treatment. She reports her outpatient treatment has consisted of medication management by her primary care physician, Brandy Bailey. Pt acknowledges she is not always compliant with her mental health medication even though she knows it is not effective unless she takes it consistently. Pt denies any family history of mental health problems and says only her daughter has had any substance abuse issues.  Pt denies any history of abuse.   Pt is dressed in scrubs. She is alert and oriented times four with normal speech. Motor behavior appears to be within normal limits. Pt has good eye contact. Thought process is coherent and relevant. Insight is fair. Judgment appears unimpaired. Pt was pleasant and cooperative throughout assessment. She states she is not willing to sign herself into a psychiatric facility and states she only wants to return home.  Attempted to contact Pt's daughter, Brandy Bailey, at [phone] without success. Voicemail was anonymous and no message was left.  Axis I: Major Depressive Disorder, Recurrent, Severe Without Psychotic Features Axis II: Deferred Axis III:  Past Medical History  Diagnosis Date  . Hypertension   . Osteoarthritis   . Hypercholesteremia   . Type II diabetes mellitus   . Depression    Axis IV: Falls, problems related to aging Axis V: GAF=35  Past Medical History:  Past Medical History  Diagnosis Date  . Hypertension   . Osteoarthritis   . Hypercholesteremia   . Type II diabetes mellitus   . Depression     Past Surgical History  Procedure Laterality Date  . Intramedullary (im) nail intertrochanteric Left 10/14/2012    Procedure: INTRAMEDULLARY (IM) NAIL INTERTROCHANTRIC;  Surgeon: Eulas Post, MD;  Location: [facility] OR;  Service: Orthopedics;  Laterality: Left;  . Cataract extraction w/ intraocular lens  implant, bilateral  1999-2004    Family History: No family history on file.  Social History:  reports that she has never smoked. She has never used smokeless tobacco. She reports that she does not drink alcohol or use  illicit drugs.  Additional Social History:  Alcohol / Drug Use Pain Medications: denies Prescriptions: See MAR Over the Counter: See MAR History of alcohol / drug use?: No history of alcohol / drug abuse Longest period of sobriety (when/how long): NA  CIWA: CIWA-Ar BP: 158/65 mmHg Pulse Rate: 63 COWS:     PATIENT STRENGTHS: (choose at least two) Ability for insight Average or above average intelligence Metallurgist fund of knowledge Religious Affiliation Supportive family/friends  Allergies: No Known Allergies  Home Medications:  (Not in a hospital admission)  OB/GYN Status:  No LMP recorded. Patient is postmenopausal.  General Assessment Data Location of Assessment: [facility] ED TTS Assessment: In system Is this a Tele or Face-to-Face Assessment?: Tele Assessment Is this an Initial Assessment or a Re-assessment for this encounter?: Initial Assessment Marital status: Widowed [place] name: Brandy Bailey Is patient pregnant?: No Pregnancy Status: No Living Arrangements: Children (Daughter) Can pt return to current living arrangement?: Yes Admission Status: Voluntary Is patient capable of signing voluntary admission?: Yes Referral Source: Self/Family/Friend Insurance type: [facility]     Crisis Care Plan Living Arrangements: Children (Daughter) Name of Psychiatrist: denies Name of Therapist: denies  Education Status Is patient currently in school?: No Current Grade: NA Highest grade of school patient has completed: NA Name of school: NA Contact person: NA  Risk to self with the past 6 months Suicidal Ideation: Yes-Currently Present Has patient been a risk to self within the past 6 months prior to admission? : Yes Suicidal Intent: No Has patient had any suicidal intent within the past 6 months prior to admission? : Yes Is patient at risk for suicide?: No Suicidal Plan?: No Has patient had any suicidal plan within the past 6 months prior to admission? : Yes Access to Means: Yes Specify Access to Suicidal Means: Access to medications What has been your use of drugs/alcohol within the last 12 months?: Denies Previous Attempts/Gestures: Yes How many times?: 1 Other Self Harm Risks: None Triggers for Past Attempts: Other (Comment)  (Depression) Intentional Self Injurious Behavior: None Family Suicide History: No Recent stressful life event(s): Recent negative physical changes, Other (Comment) (Recent falls) Persecutory voices/beliefs?: No Depression: Yes Depression Symptoms: Despondent, Isolating, Fatigue, Guilt, Loss of interest in usual pleasures, Feeling worthless/self pity Substance abuse history and/or treatment for substance abuse?: No Suicide prevention information given to non-admitted patients: Not applicable  Risk to Others within the past 6 months Homicidal Ideation: No Does patient have any lifetime risk of violence toward others beyond the six months prior to admission? : No Thoughts of Harm to Others: No Current Homicidal Intent: No Current Homicidal Plan: No Access to Homicidal Means: No Identified Victim: None History of harm to others?: No Assessment of Violence: None Noted Violent Behavior Description: Pt denies Does patient have access to weapons?: No Criminal Charges Pending?: No Does patient have a court date: No Is patient on probation?: No  Psychosis Hallucinations: None noted Delusions: None noted  Mental Status Report Appearance/Hygiene: In scrubs Eye Contact: Good Motor Activity: Unremarkable Speech: Logical/coherent Level of Consciousness: Alert Mood: Depressed Affect: Depressed, Sad Anxiety Level: Minimal Thought Processes: Coherent, Relevant Judgement: Partial Orientation: Person, Place, Situation, Appropriate for developmental age, Time Obsessive Compulsive Thoughts/Behaviors: None  Cognitive Functioning Concentration: Fair Memory: Recent Intact, Remote Intact IQ: Average Insight: Fair Impulse Control: Good Appetite: Poor Weight Loss:  (Unknown) Weight Gain: 0 Sleep: Decreased Total Hours of Sleep: 6 Vegetative Symptoms: None  ADLScreening [facility] Assessment Services) Patient's cognitive ability adequate to  safely complete daily activities?: Yes Patient able  to express need for assistance with ADLs?: Yes Independently performs ADLs?: Yes (appropriate for developmental age)  Prior Inpatient Therapy Prior Inpatient Therapy: No Prior Therapy Dates: NA Prior Therapy Facilty/Provider(s): NA Reason for Treatment: NA  Prior Outpatient Therapy Prior Outpatient Therapy: No (Medication management through PCP) Prior Therapy Dates: NA Prior Therapy Facilty/Provider(s): NA Reason for Treatment: NA Does patient have an ACCT team?: No Does patient have Intensive In-House Services?  : No Does patient have [facility] services? : No Does patient have P4CC services?: No  ADL Screening (condition at time of admission) Patient's cognitive ability adequate to safely complete daily activities?: Yes Is the patient deaf or have difficulty hearing?: No Does the patient have difficulty seeing, even when wearing glasses/contacts?: No Does the patient have difficulty concentrating, remembering, or making decisions?: No Patient able to express need for assistance with ADLs?: Yes Does the patient have difficulty dressing or bathing?: No Independently performs ADLs?: Yes (appropriate for developmental age) Does the patient have difficulty walking or climbing stairs?: Yes Weakness of Legs: Both Weakness of Arms/Hands: None       Abuse/Neglect Assessment (Assessment to be complete while patient is alone) Physical Abuse: Denies Verbal Abuse: Denies Sexual Abuse: Denies Exploitation of patient/patient's resources: Denies Self-Neglect: Denies     Merchant navy officer (For Healthcare) Does patient have an advance directive?: No Would patient like information on creating an advanced directive?: No - patient declined information    Additional Information 1:1 In Past 12 Months?: No CIRT Risk: No Elopement Risk: No Does patient have medical clearance?: Yes     Disposition: Gave clinical report to Hulan Fess, NP who said Pt does not meet criteria for inpatient  psychiatric treatment and recommends referral to outpatient psychiatry. Notified Dr. Radford Pax and Tammy Sours in [place] of recommendation.   Disposition Initial Assessment Completed for this Encounter: Yes Disposition of Patient: Other dispositions Other disposition(s): Other (Comment)  Pamalee Leyden, [facility], [facility], [facility] Triage Specialist [phone]   Pamalee Leyden 03/23/2015 11:40 PM

## 2015-03-23 NOTE — BH Specialist Note (Signed)
Received notification of TTS consult request. Spoke to Dr. Radford Pax who said Pt is depressed and states she does not want to live. Tele-assessment will be initiated.  Harlin Rain Patsy [place], LPC, [facility], [facility] Triage Specialist [phone]

## 2015-03-23 NOTE — ED Notes (Signed)
CBG 76. Nurse notified. 

## 2015-03-23 NOTE — ED Notes (Signed)
Patient transported to CT 

## 2015-03-23 NOTE — ED Provider Notes (Signed)
CSN: [id number]     Arrival date & time 03/23/15  1720 History   First MD Initiated Contact with Patient 03/23/15 1918     Chief Complaint  Patient presents with  . Altered Mental Status     (Consider location/radiation/quality/duration/timing/severity/associated sxs/prior Treatment) [name] presenting with altered mental status.  Altered Mental Status  Brought in by daughter because she "doesn't want to live."  Patient and daughter deny homicidal or suicidal thoughts.  No physical complaints. Past Medical History  Diagnosis Date  . Hypertension   . Osteoarthritis   . Hypercholesteremia   . Type II diabetes mellitus   . Depression    Past Surgical History  Procedure Laterality Date  . Intramedullary (im) nail intertrochanteric Left 10/14/2012    Procedure: INTRAMEDULLARY (IM) NAIL INTERTROCHANTRIC;  Surgeon: Eulas Post, MD;  Location: [facility] OR;  Service: Orthopedics;  Laterality: Left;  . Cataract extraction w/ intraocular lens  implant, bilateral  1999-2004   No family history on file. Social History  Substance Use Topics  . Smoking status: Never Smoker   . Smokeless tobacco: Never Used  . Alcohol Use: No   OB History    No data available     Review of Systems  All other systems reviewed and are negative.     Allergies  Review of patient's allergies indicates no known allergies.  Home Medications   Prior to Admission medications   Medication Sig Start Date End Date Taking? Authorizing Provider  aspirin 325 MG tablet Take 325 mg by mouth daily.   Yes Historical Provider, MD  calcium carbonate (OS-CAL) 600 MG TABS tablet Take 600 mg by mouth daily.   Yes Historical Provider, MD  Cyanocobalamin (VITAMIN B 12 PO) Take 1 tablet by mouth daily.   Yes Historical Provider, MD  feeding supplement, ENSURE ENLIVE, (ENSURE ENLIVE) LIQD Take 237 mLs by mouth 2 (two) times daily between meals. 12/17/14  Yes Jessica U Vann, DO   BP 158/65 mmHg  Pulse 63   Temp(Src) 98.5 F (36.9 C) (Rectal)  Resp 19  Ht 5\' 6"  (1.676 m)  Wt 113 lb 7 oz (51.455 kg)  BMI 18.32 kg/m2  SpO2 99% Physical Exam  Constitutional: She is oriented to person, place, and time. She appears well-developed and well-nourished. No distress.  HENT:  Head: Normocephalic and atraumatic.    Eyes: Pupils are equal, round, and reactive to light.  Neck: Normal range of motion.  Cardiovascular: Normal rate and intact distal pulses.   Pulmonary/Chest: No respiratory distress.  Abdominal: Normal appearance. She exhibits no distension.  Musculoskeletal: Normal range of motion.  Neurological: She is alert and oriented to person, place, and time. No cranial nerve deficit.  Skin: Skin is warm and dry. No rash noted.  Psychiatric: She has a normal mood and affect. Her behavior is normal. She expresses no homicidal and no suicidal ideation. She expresses no suicidal plans and no homicidal plans.  Nursing note and vitals reviewed.   ED Course  Procedures (including critical care time) Labs Review Labs Reviewed  BASIC METABOLIC PANEL - Abnormal; Notable for the following:    Glucose, Bld 109 (*)    Calcium 8.7 (*)    All other components within normal limits  CBC - Abnormal; Notable for the following:    WBC 3.7 (*)    Hemoglobin 11.0 (*)    MCH 25.8 (*)    RDW 17.9 (*)    All other components within normal limits  URINALYSIS, ROUTINE W REFLEX MICROSCOPIC (NOT AT [facility]) - Abnormal; Notable for the following:    Protein, ur 100 (*)    All other components within normal limits  URINE MICROSCOPIC-ADD ON - Abnormal; Notable for the following:    Casts HYALINE CASTS (*)    Crystals CA OXALATE CRYSTALS (*)    All other components within normal limits  CBG MONITORING, ED    Imaging Review Dg Chest 2 View  03/23/2015   CLINICAL DATA:  Altered mental status, syncope.  EXAM: CHEST  2 VIEW  COMPARISON:  March 03, 2015.  FINDINGS: The heart size and mediastinal contours are within  normal limits. Both lungs are clear. No pneumothorax or pleural effusion is noted. Mild levoscoliosis of lower thoracic spine is noted.  IMPRESSION: No active cardiopulmonary disease.   Electronically Signed   By: Lupita Raider, M.D.   On: 03/23/2015 20:07   Ct Head Wo Contrast  03/23/2015   CLINICAL DATA:  Fall. Bruising over the left eye. Confusion for 1 month. Multiple falls.  EXAM: CT HEAD WITHOUT CONTRAST  TECHNIQUE: Contiguous axial images were obtained from the base of the skull through the vertex without intravenous contrast.  COMPARISON:  CT of the head without contrast 03/03/2015.  FINDINGS: Periventricular white matter changes are stable. A remote lacunar infarct again noted in the right thalamus. No acute cortical infarct, hemorrhage, or mass lesion is present.  The paranasal sinuses and mastoid air cells are clear. The calvarium is intact. Atherosclerotic calcifications are present within the cavernous internal carotid arteries.  No significant extra calvarial soft tissue injury is evident.  IMPRESSION: 1. Stable atrophy and white matter disease. 2. No acute intracranial abnormality. 3. Remote lacunar infarct of the right thalamus.   Electronically Signed   By: Marin Roberts M.D.   On: 03/23/2015 21:12   I, Merrissa Giacobbe L, personally reviewed and evaluated these images and lab results as part of my medical decision-making.   EKG Interpretation   Date/Time:  Friday March 23 2015 17:27:33 EDT Ventricular Rate:  69 PR Interval:  144 QRS Duration: 78 QT Interval:  422 QTC Calculation: 452 R Axis:   34 Text Interpretation:  Normal sinus rhythm Nonspecific T wave abnormality  Abnormal ECG Confirmed by Carmelle Bamberg  MD, Jovan Colligan ([id number]) on 03/23/2015 8:56:05  PM     Was seen by TTS.  Does not meet inpatient criteria.  Will discharge to outpatient psychiatric followup. MDM   Final diagnoses:  Depression        Nelva Nay, MD 03/23/15 [phone]

## 2015-03-24 ENCOUNTER — Emergency Department (HOSPITAL_COMMUNITY)
Admission: EM | Admit: 2015-03-24 | Discharge: 2015-03-26 | Disposition: A | Discharge status: Home / Self Care | Payer: Medicare Other | Attending: Emergency Medicine | Admitting: Emergency Medicine

## 2015-03-24 ENCOUNTER — Encounter (HOSPITAL_COMMUNITY): Payer: Self-pay | Admitting: *Deleted

## 2015-03-24 DIAGNOSIS — Z79899 Other long term (current) drug therapy: Secondary | ICD-10-CM | POA: Diagnosis not present

## 2015-03-24 DIAGNOSIS — M7981 Nontraumatic hematoma of soft tissue: Secondary | ICD-10-CM | POA: Diagnosis not present

## 2015-03-24 DIAGNOSIS — M1990 Unspecified osteoarthritis, unspecified site: Secondary | ICD-10-CM | POA: Diagnosis not present

## 2015-03-24 DIAGNOSIS — I10 Essential (primary) hypertension: Secondary | ICD-10-CM | POA: Diagnosis not present

## 2015-03-24 DIAGNOSIS — E119 Type 2 diabetes mellitus without complications: Secondary | ICD-10-CM | POA: Insufficient documentation

## 2015-03-24 DIAGNOSIS — Z7982 Long term (current) use of aspirin: Secondary | ICD-10-CM | POA: Diagnosis not present

## 2015-03-24 DIAGNOSIS — F329 Major depressive disorder, single episode, unspecified: Secondary | ICD-10-CM | POA: Insufficient documentation

## 2015-03-24 DIAGNOSIS — F331 Major depressive disorder, recurrent, moderate: Secondary | ICD-10-CM | POA: Diagnosis present

## 2015-03-24 DIAGNOSIS — Z915 Personal history of self-harm: Secondary | ICD-10-CM | POA: Insufficient documentation

## 2015-03-24 DIAGNOSIS — F32A Depression, unspecified: Secondary | ICD-10-CM

## 2015-03-24 DIAGNOSIS — R45851 Suicidal ideations: Secondary | ICD-10-CM

## 2015-03-24 MED ORDER — ONDANSETRON HCL 4 MG PO TABS: 4.0000 mg | ORAL_TABLET | Freq: Three times a day (TID) | ORAL | Status: DC | PRN

## 2015-03-24 MED ORDER — ENSURE ENLIVE PO LIQD
237.0000 mL | Freq: Two times a day (BID) | ORAL | Status: DC
  Administered 2015-03-24 – 2015-03-25 (×3): 237 mL via ORAL
  Filled 2015-03-24 (×5): qty 237

## 2015-03-24 MED ORDER — ALUM & MAG HYDROXIDE-SIMETH 200-200-20 MG/5ML PO SUSP: 30.0000 mL | ORAL | Status: DC | PRN

## 2015-03-24 NOTE — Progress Notes (Signed)
Becky,RN notified by counselor disposition plan for patient

## 2015-03-24 NOTE — ED Notes (Signed)
Pt's daughter briefly visited w/pt after being d/c'd from ED herself. Pt wanted to leave w/daughter. Advised pt she is not ready for d/c at this time. Pt then asked daughter to come pick her up in am. Daughter advised she will come visit her. Daughter said to pt - "I can't believe you turned around and came back up here". Pt stated she couldn't believe it either and "something's wrong with my brain. Help fix my brain when I get out of here." Daughter aware of tx plan - admission.

## 2015-03-24 NOTE — ED Notes (Signed)
CSW at bed side for assessment

## 2015-03-24 NOTE — ED Notes (Signed)
Pt concerned re: dog and asking if can go speak w/daughter. Advised pt her daughter will come see when she is d/c'd from ED. Pt remains concerned and asking same questions repeatedly. Called Louise as requested by pt and requested for her to check on pt's dog - advised she will. Pt aware and anxiety appears to be decreasing.

## 2015-03-24 NOTE — ED Notes (Signed)
Attempted to call the daughter; no answer.

## 2015-03-24 NOTE — ED Notes (Signed)
TTS complete 

## 2015-03-24 NOTE — ED Notes (Signed)
Pt at nurses' desk attempting to make phone call. 

## 2015-03-24 NOTE — ED Notes (Signed)
PT acuity changed to 2.

## 2015-03-24 NOTE — ED Notes (Signed)
Per Alona Bene, SW, daughter advised she will is expecting to come to ED and pick up pt in approx 30 minutes.

## 2015-03-24 NOTE — ED Provider Notes (Signed)
History  This chart was scribed for non-physician practitioner, Hebert Soho- Arvilla Market, PA-C,working with Cathren Laine, MD, by Karle Plumber, ED Scribe. This patient was seen in room TR10C/TR10C and the patient's care was started at 2:38 PM.  Chief Complaint  Patient presents with  . Depression   The history is provided by the patient and medical records. No language interpreter was used.    HPI Comments:  Brandy Bailey is a 81 y.o. female with PMHx of depression and suicide attempts who presents to the Emergency Department complaining of suicidal ideations. She states she wants to go to sleep and not wake up. She was seen here this morning and was not referred to outpatient services. She denies homicidal ideations or hallucinations. She states she is not on medication for depression but states she needs something to help "shake this off". Pt reports that she lives with her daughter and is not being hurt at the home or in any danger.   Past Medical History  Diagnosis Date  . Hypertension   . Osteoarthritis   . Hypercholesteremia   . Type II diabetes mellitus   . Depression    Past Surgical History  Procedure Laterality Date  . Intramedullary (im) nail intertrochanteric Left 10/14/2012    Procedure: INTRAMEDULLARY (IM) NAIL INTERTROCHANTRIC;  Surgeon: Eulas Post, MD;  Location: [facility] OR;  Service: Orthopedics;  Laterality: Left;  . Cataract extraction w/ intraocular lens  implant, bilateral  1999-2004   History reviewed. No pertinent family history. Social History  Substance Use Topics  . Smoking status: Never Smoker   . Smokeless tobacco: Never Used  . Alcohol Use: No     Comment: Patient denies    OB History    No data available     Review of Systems  Constitutional: Negative for fever and chills.  Gastrointestinal: Negative for nausea and vomiting.  Psychiatric/Behavioral: Positive for suicidal ideas. Negative for hallucinations.    Allergies  Review of patient's  allergies indicates no known allergies.  Home Medications   Prior to Admission medications   Medication Sig Start Date End Date Taking? Authorizing Provider  calcium carbonate (OS-CAL) 600 MG TABS tablet Take 600 mg by mouth daily.   Yes Historical Provider, MD  Cyanocobalamin (VITAMIN B 12 PO) Take 1 tablet by mouth daily.   Yes Historical Provider, MD  feeding supplement, ENSURE ENLIVE, (ENSURE ENLIVE) LIQD Take 237 mLs by mouth 2 (two) times daily between meals. 12/17/14  Yes Joseph Art, DO  aspirin 325 MG tablet Take 325 mg by mouth daily.    Historical Provider, MD   Triage Vitals: BP 148/70 mmHg  Pulse 72  Temp(Src) 98.1 F (36.7 C) (Oral)  Resp 18  SpO2 96% Physical Exam  Constitutional: She is oriented to person, place, and time. She appears well-developed and well-nourished.  HENT:  Head: Normocephalic.  Contusion around right eye  Eyes: EOM are normal.  Neck: Normal range of motion.  Cardiovascular: Normal rate.   Pulmonary/Chest: Effort normal.  Musculoskeletal: Normal range of motion.  Neurological: She is alert and oriented to person, place, and time.  Skin: Skin is warm and dry.  Psychiatric: She is withdrawn. She exhibits a depressed mood. She expresses suicidal ideation. She expresses no homicidal ideation. She expresses no suicidal plans and no homicidal plans.  No hallucinations  Nursing note and vitals reviewed.   ED Course  Procedures (including critical care time) DIAGNOSTIC STUDIES: Oxygen Saturation is 96% on RA, adequate by my interpretation.  COORDINATION OF CARE: 2:44 PM- Will order TTS consult. Pt verbalizes understanding and agrees to plan.  2:52 PM- Spoke will case manager and she will speak with psych physician.   Medications  alum & mag hydroxide-simeth (MAALOX/MYLANTA) 200-200-20 MG/5ML suspension 30 mL (not administered)  ondansetron (ZOFRAN) tablet 4 mg (not administered)    Labs Review Labs Reviewed - No data to  display  Imaging Review Dg Chest 2 View  03/23/2015   CLINICAL DATA:  Altered mental status, syncope.  EXAM: CHEST  2 VIEW  COMPARISON:  March 03, 2015.  FINDINGS: The heart size and mediastinal contours are within normal limits. Both lungs are clear. No pneumothorax or pleural effusion is noted. Mild levoscoliosis of lower thoracic spine is noted.  IMPRESSION: No active cardiopulmonary disease.   Electronically Signed   By: Lupita Raider, M.D.   On: 03/23/2015 20:07   Ct Head Wo Contrast  03/23/2015   CLINICAL DATA:  Fall. Bruising over the left eye. Confusion for 1 month. Multiple falls.  EXAM: CT HEAD WITHOUT CONTRAST  TECHNIQUE: Contiguous axial images were obtained from the base of the skull through the vertex without intravenous contrast.  COMPARISON:  CT of the head without contrast 03/03/2015.  FINDINGS: Periventricular white matter changes are stable. A remote lacunar infarct again noted in the right thalamus. No acute cortical infarct, hemorrhage, or mass lesion is present.  The paranasal sinuses and mastoid air cells are clear. The calvarium is intact. Atherosclerotic calcifications are present within the cavernous internal carotid arteries.  No significant extra calvarial soft tissue injury is evident.  IMPRESSION: 1. Stable atrophy and white matter disease. 2. No acute intracranial abnormality. 3. Remote lacunar infarct of the right thalamus.   Electronically Signed   By: Marin Roberts M.D.   On: 03/23/2015 21:12   I, Catha Gosselin, personally reviewed and evaluated these images and lab results as part of my medical decision-making.   EKG Interpretation None      MDM   Final diagnoses:  Depression  Suicidal ideation   This is the third time patient has been seen for depression and SI. According to the notes, the patient will be transferred to a [facility] psych unit. She is stable and labs were normal this morning. Vital stable. She is medically cleared to be  transferred.  I personally performed the services described in this documentation, which was scribed in my presence. The recorded information has been reviewed and is accurate.    Catha Gosselin, PA-C 03/24/15 1721  Cathren Laine, MD 03/25/15 [phone]

## 2015-03-24 NOTE — ED Notes (Signed)
Per Alene Mires, pt's daughter, gave her Garfield Cornea (granddaughter) phone number - [phone] - advised she lives in [place] - stating she is pt's POA. This number is incorrect - out of service.

## 2015-03-24 NOTE — ED Notes (Signed)
Pt dressed into maroon-colored paper scrubs and yellow socks. Fall risk bracelet applied to pt's wrist.

## 2015-03-24 NOTE — ED Notes (Signed)
Pt on phone talking w/Courtney - advising for her not to let the dog out - to only give him food/water.

## 2015-03-24 NOTE — ED Notes (Signed)
Staffing Office aware of need for sitter. 

## 2015-03-24 NOTE — ED Notes (Signed)
Notified Brooke, Charity fundraiser, that pt is requesting she advise her daughter of plan for pt to be admitted. Brooke advised she will tell her. Pt aware.

## 2015-03-24 NOTE — ED Notes (Signed)
Pt asked if have been able to reach daughter as of yet. Advised pt no. Pt stated, "Keep trying - sometimes she sleeps in."

## 2015-03-24 NOTE — ED Notes (Signed)
TTS being performed.  

## 2015-03-24 NOTE — Progress Notes (Signed)
9:45AM CSW made contact with patient's daughter Hassell Halim at [phone]. Daughter informed CSW that she had just received the voicemails regarding transportation for patient. Daughter plans to pick up the patient by 10:30am. CSW to follow-up.   Fernande Boyden, LCSWA Clinical Social Worker [facility] Ph: [phone]

## 2015-03-24 NOTE — ED Notes (Signed)
Per Tammy Sours, SW, [facility], plan is to send pt's info to [facility].

## 2015-03-24 NOTE — BH Assessment (Signed)
Tele Assessment Note   Brandy Bailey is an 81 y.o. female who presents to [facility] emergency department after recently being discharged this morning. Patient was alert and oriented x4 but reports that she is unsure why she is in the hospital. She states that she cannot remember as good as she previously could yet did report that she feels depressed. Patient shared that she has been feeling depressed for 1 month. Patient reported feelings of frustration as she stated "I can't remember what I'm suppose to do", stating that previously she could complete daily activities but now is unable to do so. Patient currently resides with her daughter Brandy Bailey who she reports is currently in the emergency department as well for "not feeling good". Patient reported that she has never attempted suicide although previous chart documentation states that she had one prior attempt in the past. Patient was observed to have discoloration located around her right eye and was unable to verbalize what caused it as she reported that she could not remember. Patient exhibited difficulty with recalling recent events but was able to identify her name, current location, date of birth, and the current president of the [place]. Patient denies SI/HI/AVH at this time.   Collateral information from previous assessment completed by Shela Commons, LPC on 03/23/15 23:40 is listed below  "Brandy Bailey is an 81 y.o. female, white, widowed who presents unaccompanied to [facility] ED reporting symptoms of depression. Pt reports she has a history of depression and recent falls, which is why she says she is presenting. Pt states she "feels like my brain isn't working right" but has difficulty explaining her symptoms other than feeling depressed and not herself. She reports symptoms including decreased concentration, "feeling sorry for myself", social withdrawal, loss of interest in usual pleasures, decreased sleep, weight loss and feelings  of sadness, hopelessness and frustration. Pt denies current suicidal plan or intent but states she would like to "go to sleep and wake up in heaven." Pt identifies her religious faith as a deterrent to suicide. She reports one previous suicide attempt in July 2016 when she took a handful of aspirin. At that time she was assessed in the ED and discharged to outpatient treatment. Pt denies any family history of suicide. Pt denies any current homicidal ideation or history of violence. Pt denies auditory or visual hallucinations. She denies any history of alcohol, tobacco or substance abuse.   Pt reports she lives with her adult daughter. She reports she had three daughters and only one is still living. She also has support through her [facility], which she attends weekly. Pt states she thinks about the past and can't see a future due to her age. She reports she is independent with all ADLs. She denies any history of inpatient psychiatric treatment. She reports her outpatient treatment has consisted of medication management by her primary care physician, Dr. Wilhemina Cash. Pt acknowledges she is not always compliant with her mental health medication even though she knows it is not effective unless she takes it consistently. Pt denies any family history of mental health problems and says only her daughter has had any substance abuse issues. Pt denies any history of abuse.   Pt is dressed in scrubs. She is alert and oriented times four with normal speech. Motor behavior appears to be within normal limits. Pt has good eye contact. Thought process is coherent and relevant. Insight is fair. Judgment appears unimpaired. Pt was pleasant and cooperative throughout assessment.  She states she is not willing to sign herself into a psychiatric facility and states she only wants to return home.  Attempted to contact Pt's daughter, Brandy Bailey, at [phone] without success. Voicemail was anonymous and no message  was left"  Axis I: Major Depression, single episode  Past Medical History:  Past Medical History  Diagnosis Date  . Hypertension   . Osteoarthritis   . Hypercholesteremia   . Type II diabetes mellitus   . Depression     Past Surgical History  Procedure Laterality Date  . Intramedullary (im) nail intertrochanteric Left 10/14/2012    Procedure: INTRAMEDULLARY (IM) NAIL INTERTROCHANTRIC;  Surgeon: Eulas Post, MD;  Location: [facility] OR;  Service: Orthopedics;  Laterality: Left;  . Cataract extraction w/ intraocular lens  implant, bilateral  1999-2004    Family History: History reviewed. No pertinent family history.  Social History:  reports that she has never smoked. She has never used smokeless tobacco. She reports that she does not drink alcohol or use illicit drugs.  Additional Social History:  Alcohol / Drug Use History of alcohol / drug use?: No history of alcohol / drug abuse  CIWA: CIWA-Ar BP: 148/70 mmHg Pulse Rate: 72 COWS:    PATIENT STRENGTHS: (choose at least two) Ability for insight Supportive family/friends  Allergies: No Known Allergies  Home Medications:  (Not in a hospital admission)  OB/GYN Status:  No LMP recorded. Patient is postmenopausal.  General Assessment Data Location of Assessment: [facility] ED TTS Assessment: In system Is this a Tele or Face-to-Face Assessment?: Tele Assessment Is this an Initial Assessment or a Re-assessment for this encounter?: Initial Assessment Marital status: Widowed [place] name: Brandy Bailey Is patient pregnant?: No Pregnancy Status: No Living Arrangements: Children Can pt return to current living arrangement?: Yes Admission Status: Voluntary Is patient capable of signing voluntary admission?: Yes Referral Source: Self/Family/Friend Insurance type: Engineer, drilling Care Plan Living Arrangements: Children Name of Psychiatrist: None Name of Therapist: None  Education Status Is patient currently in school?:  No  Risk to self with the past 6 months Suicidal Ideation: No Has patient been a risk to self within the past 6 months prior to admission? : Yes Suicidal Intent: No Has patient had any suicidal intent within the past 6 months prior to admission? : Yes Is patient at risk for suicide?: No Suicidal Plan?: No Has patient had any suicidal plan within the past 6 months prior to admission? : No Access to Means: No Specify Access to Suicidal Means: N/A What has been your use of drugs/alcohol within the last 12 months?: None Previous Attempts/Gestures: No How many times?: 0 Triggers for Past Attempts: Unpredictable Intentional Self Injurious Behavior: None Family Suicide History: No Recent stressful life event(s): Recent negative physical changes Persecutory voices/beliefs?: No Depression: Yes Depression Symptoms: Insomnia, Tearfulness, Isolating, Feeling worthless/self pity, Loss of interest in usual pleasures, Guilt, Fatigue Substance abuse history and/or treatment for substance abuse?: No Suicide prevention information given to non-admitted patients: Not applicable  Risk to Others within the past 6 months Homicidal Ideation: No Does patient have any lifetime risk of violence toward others beyond the six months prior to admission? : No Thoughts of Harm to Others: No Current Homicidal Intent: No Current Homicidal Plan: No Access to Homicidal Means: No Identified Victim: N/A History of harm to others?: No Assessment of Violence: None Noted Violent Behavior Description: Pt is calm and cooperative Does patient have access to weapons?: No Criminal Charges Pending?: No  Does patient have a court date: No Is patient on probation?: No  Psychosis Hallucinations: None noted Delusions: None noted  Mental Status Report Appearance/Hygiene: Unremarkable Eye Contact: Fair Motor Activity: Unsteady Speech: Logical/coherent Level of Consciousness: Alert Mood: Helpless Affect: Appropriate to  circumstance Anxiety Level: Minimal Thought Processes: Coherent, Relevant Judgement: Partial Orientation: Person, Place, Time, Situation, Appropriate for developmental age Obsessive Compulsive Thoughts/Behaviors: None  Cognitive Functioning Concentration: Fair Memory: Recent Intact, Remote Intact IQ: Average Insight: Fair Impulse Control: Fair Appetite: Poor Weight Loss: 0 Weight Gain: 0 Sleep: Decreased Total Hours of Sleep: 6 Vegetative Symptoms: None  ADLScreening [facility] Assessment Services) Patient's cognitive ability adequate to safely complete daily activities?: Yes Patient able to express need for assistance with ADLs?: Yes Independently performs ADLs?: Yes (appropriate for developmental age)  Prior Inpatient Therapy Prior Inpatient Therapy: No  Prior Outpatient Therapy Prior Outpatient Therapy: No  ADL Screening (condition at time of admission) Patient's cognitive ability adequate to safely complete daily activities?: Yes Is the patient deaf or have difficulty hearing?: No Does the patient have difficulty seeing, even when wearing glasses/contacts?: No Does the patient have difficulty concentrating, remembering, or making decisions?: Yes Patient able to express need for assistance with ADLs?: Yes Does the patient have difficulty dressing or bathing?: No Independently performs ADLs?: Yes (appropriate for developmental age) Does the patient have difficulty walking or climbing stairs?: Yes Weakness of Arms/Hands: None     Therapy Consults (therapy consults require a physician order) PT Evaluation Needed: No OT Evalulation Needed: No SLP Evaluation Needed: No Abuse/Neglect Assessment (Assessment to be complete while patient is alone) Physical Abuse: Denies Verbal Abuse: Denies Sexual Abuse: Denies Exploitation of patient/patient's resources: Denies Self-Neglect: Denies Values / Beliefs Cultural Requests During Hospitalization: None Spiritual Requests During  Hospitalization: None Consults Spiritual Care Consult Needed: No Social Work Consult Needed: No Merchant navy officer (For Healthcare) Does patient have an advance directive?: No Would patient like information on creating an advanced directive?: No - patient declined information    Additional Information 1:1 In Past 12 Months?: No CIRT Risk: No Elopement Risk: No Does patient have medical clearance?: Yes     Disposition:  Disposition Initial Assessment Completed for this Encounter: Yes  Paulino Door, Bryah [place] C 03/24/2015 3:27 PM

## 2015-03-24 NOTE — Progress Notes (Signed)
Counselor consulted with Brandy Colonel, NP who states that the recommendation is for patient to be referred to [facility] for further evaluation and inpatient treatment to determine mental stability. Counselor notified ED CSW Alona Bene of disposition plan.   [name]. MSW, LCSW Therapeutic Triage [facility] Specialist

## 2015-03-24 NOTE — ED Notes (Signed)
Called [employer] and ordered pt's breakfast - CHO mod.

## 2015-03-24 NOTE — Progress Notes (Signed)
Disposition CSW completed referrals to the following Geri[facility]Psych facilities:  [facility]. [facility]   CSW will continue to assist with placement needs.  [facility] Disposition CSW [phone]

## 2015-03-24 NOTE — ED Notes (Signed)
Pt did not wake when moved to Pod C.

## 2015-03-24 NOTE — ED Notes (Addendum)
Neita Garnet, [facility], aware pt's granddaughter, [name], called and advised her number is [phone]. She also advised she was pt's POA but pt changed it to her mother - Hassell Halim. When RN asked [name] and pt who is pt's POA, [name] advised it is [name]. Pt stated she thought she had changed it - both advised they are not able to locate paperwork. Asked [name] to bring it if she is able to locate it. Voiced understanding.

## 2015-03-24 NOTE — ED Notes (Signed)
Pt ambulatory to nurses' desk stating she needs to go home - states "I have things I need to do." States she needs to let her daughter's dog out. Advised pt reason for delay. Voiced understanding.

## 2015-03-24 NOTE — ED Notes (Signed)
LM for Brandy Bailey - [phone] - for her to return call to advise who is picking up pt. [phone] for granddaughter - is wrong number. [phone] - fast busy signal.

## 2015-03-24 NOTE — ED Notes (Addendum)
Pt in hallway asking if she can leave now. Advised pt unable to do so at this time d/t waiting on TTS plan. Pt asked granddaughter, Dale Clint, if she could go home w/her. Courtney advised same. Pt then gave Toni Amend a hug and told her to kiss her kids for her and went back into her room. Pt's granddaughter, Dale MacArthur - [phone], gave her number to contact if pt is d/c'd and her daughter is admitted. Advised she will bring pt to her house. Per Toni Amend, after pt was d/c'd this am, she drove herself home while her daughter stayed to be seen in ED herself. Pt then drove herself back to ED for evaluation of depression.

## 2015-03-24 NOTE — ED Notes (Signed)
Pt on phone w/daughter at nurses' desk. Pt advising her that her (daughter's) purse was in the trunk of the vehicle. When pt ended call, pt asked nurse again if she may go home. Advised pt not at this time and will re-evaluate tomorrow. Pt voiced understanding and returned to room.

## 2015-03-24 NOTE — ED Notes (Signed)
Brandy Bailey, SW, aware of need of assistance in reaching pt's family so pt can be discharged.

## 2015-03-24 NOTE — ED Notes (Signed)
Pt was just dc from ED earlier today. Returns stating multiple times that "she just wants to go to sleep and not wake up again." pt denies wanting to kill herself or harm herself, she states "she just doesn't want to go on anymore." no other complaints expressed at triage.

## 2015-03-25 MED ORDER — LORAZEPAM 1 MG PO TABS
0.5000 mg | ORAL_TABLET | Freq: Once | ORAL | Status: AC
  Administered 2015-03-25: 0.5 mg via ORAL
  Filled 2015-03-25: qty 1

## 2015-03-25 NOTE — Progress Notes (Signed)
11:26PM CSW spoke with disposition CSW Simonne Come) at [facility]. Patient has been faxed out to multiple Geri-Psych facilities. Currently awaiting bed offers.   Fernande Boyden, LCSWA Clinical Social Worker [facility] Ph: [phone]

## 2015-03-25 NOTE — ED Notes (Signed)
Pt offered a shower, declined at this time.

## 2015-03-25 NOTE — ED Notes (Signed)
Breakfast tray delivered to room.  

## 2015-03-25 NOTE — ED Notes (Signed)
Pt states that she is not having any thoughts of hurting herself or anyone else, pt also states that she does not have any feelings of wanting to die stating "I'm over that, I just want to go home".

## 2015-03-26 ENCOUNTER — Encounter (HOSPITAL_COMMUNITY): Payer: Self-pay | Admitting: *Deleted

## 2015-03-26 ENCOUNTER — Emergency Department (HOSPITAL_COMMUNITY)
Admission: EM | Admit: 2015-03-26 | Discharge: 2015-03-26 | Disposition: A | Discharge status: Home / Self Care | Payer: Medicare Other | Attending: Emergency Medicine | Admitting: Emergency Medicine

## 2015-03-26 DIAGNOSIS — I10 Essential (primary) hypertension: Secondary | ICD-10-CM | POA: Insufficient documentation

## 2015-03-26 DIAGNOSIS — M1990 Unspecified osteoarthritis, unspecified site: Secondary | ICD-10-CM | POA: Insufficient documentation

## 2015-03-26 DIAGNOSIS — F331 Major depressive disorder, recurrent, moderate: Secondary | ICD-10-CM | POA: Diagnosis not present

## 2015-03-26 DIAGNOSIS — Z7982 Long term (current) use of aspirin: Secondary | ICD-10-CM | POA: Insufficient documentation

## 2015-03-26 DIAGNOSIS — R4182 Altered mental status, unspecified: Secondary | ICD-10-CM | POA: Diagnosis present

## 2015-03-26 DIAGNOSIS — Z8659 Personal history of other mental and behavioral disorders: Secondary | ICD-10-CM

## 2015-03-26 DIAGNOSIS — F0390 Unspecified dementia without behavioral disturbance: Secondary | ICD-10-CM | POA: Diagnosis not present

## 2015-03-26 DIAGNOSIS — E119 Type 2 diabetes mellitus without complications: Secondary | ICD-10-CM | POA: Diagnosis not present

## 2015-03-26 DIAGNOSIS — Z79899 Other long term (current) drug therapy: Secondary | ICD-10-CM | POA: Diagnosis not present

## 2015-03-26 LAB — CBG MONITORING, ED: GLUCOSE-CAPILLARY: 109 mg/dL — AB (ref 65–99)

## 2015-03-26 NOTE — ED Notes (Addendum)
Pt just discharged from pod c, family reports going to her dr appt and pt became disoriented and feeling lightheaded. Pt seems appropriate at triage, no neuro deficits noted. No acute distress noted.

## 2015-03-26 NOTE — ED Notes (Signed)
Plan for discharge. Daughter called to come pick up patient.

## 2015-03-26 NOTE — ED Notes (Signed)
MD at bedside. 

## 2015-03-26 NOTE — Consult Note (Signed)
Telepsych Consultation   Reason for Consult:  Suicidal statements, pt reports they have resolved Referring Physician:  EDP Patient Identification: Brandy Bailey MRN:  [id number] Principal Diagnosis: MDD (major depressive disorder), recurrent episode, moderate Diagnosis:   Patient Active Problem List   Diagnosis Date Noted  . MDD (major depressive disorder), recurrent episode, moderate [F33.1] 03/26/2015    Priority: High  . Protein-calorie malnutrition, severe [E43] 12/16/2014  . Cellulitis of right foot [L03.115] 12/15/2014  . Cellulitis [L03.90] 12/15/2014  . Depression [F32.9] 03/10/2013  . Acute encephalopathy [G93.40] 02/21/2013  . History of CVA (cerebrovascular accident) [Z86.73] 02/21/2013  . Acute blood loss anemia [D62] 10/15/2012  . Closed left hip fracture [S72.002A] 10/13/2012  . Syncope and collapse [R55] 10/13/2012  . Diabetes [E11.9] 10/13/2012  . Hypertension [I10] 10/13/2012    Total Time spent with patient: 25 minutes  Subjective:   Brandy Bailey is a 81 y.o. female patient admitted with reports of suicidal ideation, which nursing staff report have now resolved. Pt seen and chart reviewed. She is alert/oriented x4, calm, cooperative, and appropriate during assessment, although extremely hard of hearing. Nursing staff was present to repeat words that pt has trouble hearing. Pt denies suicidal ideation, stating that she feels much better and would like to return home.   HPI:  I have reviewed and concur with HPI below, modified as follows: Brandy Bailey is an 81 y.o. female who presents to [facility] emergency department after recently being discharged this morning. Patient was alert and oriented x4 but reports that she is unsure why she is in the hospital. She states that she cannot remember as good as she previously could yet did report that she feels depressed. Patient shared that she has been feeling depressed for 1 month. Patient reported feelings of  frustration as she stated "I can't remember what I'm suppose to do", stating that previously she could complete daily activities but now is unable to do so. Patient currently resides with her daughter [name] who she reports is currently in the emergency department as well for "not feeling good". Patient reported that she has never attempted suicide although previous chart documentation states that she had one prior attempt in the past. Patient was observed to have discoloration located around her right eye and was unable to verbalize what caused it as she reported that she could not remember. Patient exhibited difficulty with recalling recent events but was able to identify her name, current location, date of birth, and the current president of the [place]. Patient denies SI/HI/AVH at this time.   TTS attempted to contact Pt's daughter, [name], at [phone] without success. Voicemail was anonymous and no message was left"   Pt spen tthe night int he ED without incident and family is in agreement to take pt home if she is no longer suicidal. Telepsychiatry consult ordered and performed as above.   HPI Elements:   Location:  Psychiatric. Quality:  Improving, baseline, stable for discharge. Severity:  Moderate. Timing:  Intermittent. Duration:  Transient. Context:  Exacerbation of underlying moderate depression manifested as temporary suicidal ideation now resolved..  Past Medical History:  Past Medical History  Diagnosis Date  . Hypertension   . Osteoarthritis   . Hypercholesteremia   . Type II diabetes mellitus   . Depression     Past Surgical History  Procedure Laterality Date  . Intramedullary (im) nail intertrochanteric Left 10/14/2012    Procedure: INTRAMEDULLARY (IM) NAIL INTERTROCHANTRIC;  Surgeon: Burtis Junes  Dion Saucier, MD;  Location: [facility] OR;  Service: Orthopedics;  Laterality: Left;  . Cataract extraction w/ intraocular lens  implant, bilateral  1999-2004   Family  History: History reviewed. No pertinent family history. Social History:  History  Alcohol Use No    Comment: Patient denies      History  Drug Use No    Comment: Patient denies    Social History   Social History  . Marital Status: Widowed    Spouse Name: N/A  . Number of Children: N/A  . Years of Education: N/A   Social History Main Topics  . Smoking status: Never Smoker   . Smokeless tobacco: Never Used  . Alcohol Use: No     Comment: Patient denies   . Drug Use: No     Comment: Patient denies  . Sexual Activity: No   Other Topics Concern  . None   Social History Narrative   Additional Social History:    History of alcohol / drug use?: No history of alcohol / drug abuse                     Allergies:  No Known Allergies  Labs: No results found for this or any previous visit (from the past 48 hour(s)).  Vitals: Blood pressure 156/86, pulse 83, temperature 99 F (37.2 C), temperature source Oral, resp. rate 12, SpO2 99 %.  Risk to Self: Suicidal Ideation: No Suicidal Intent: No Is patient at risk for suicide?: No Suicidal Plan?: No Access to Means: No Specify Access to Suicidal Means: N/A What has been your use of drugs/alcohol within the last 12 months?: None How many times?: 0 Triggers for Past Attempts: Unpredictable Intentional Self Injurious Behavior: None Risk to Others: Homicidal Ideation: No Thoughts of Harm to Others: No Current Homicidal Intent: No Current Homicidal Plan: No Access to Homicidal Means: No Identified Victim: N/A History of harm to others?: No Assessment of Violence: None Noted Violent Behavior Description: Pt is calm and cooperative Does patient have access to weapons?: No Criminal Charges Pending?: No Does patient have a court date: No Prior Inpatient Therapy: Prior Inpatient Therapy: No Prior Outpatient Therapy: Prior Outpatient Therapy: No  Current Facility-Administered Medications  Medication Dose Route  Frequency Provider Last Rate Last Dose  . alum & mag hydroxide-simeth (MAALOX/MYLANTA) 200-200-20 MG/5ML suspension 30 mL  30 mL Oral PRN Gerhard Munch, MD      . feeding supplement (ENSURE ENLIVE) (ENSURE ENLIVE) liquid 237 mL  237 mL Oral BID BM Cathren Laine, MD   237 mL at 03/25/15 2117  . ondansetron (ZOFRAN) tablet 4 mg  4 mg Oral Q8H PRN Gerhard Munch, MD       Current Outpatient Prescriptions  Medication Sig Dispense Refill  . calcium carbonate (OS-CAL) 600 MG TABS tablet Take 600 mg by mouth daily.    . Cyanocobalamin (VITAMIN B 12 PO) Take 1 tablet by mouth daily.    . feeding supplement, ENSURE ENLIVE, (ENSURE ENLIVE) LIQD Take 237 mLs by mouth 2 (two) times daily between meals. 237 mL 12  . aspirin 325 MG tablet Take 325 mg by mouth daily.      Musculoskeletal: UTO, camera  Psychiatric Specialty Exam: Physical Exam  Review of Systems  Psychiatric/Behavioral: Positive for depression and hallucinations. Negative for suicidal ideas. The patient does not have insomnia.   All other systems reviewed and are negative.   Blood pressure 156/86, pulse 83, temperature 99 F (37.2 C), temperature source Oral,  resp. rate 12, SpO2 99 %.There is no weight on file to calculate BMI.  General Appearance: Casual and Fairly Groomed  Patent attorney::  Fair  Speech:  Garbled (missing teeth)  Volume:  Decreased  Mood:  Euthymic  Affect:  Appropriate and Congruent  Thought Process:  Coherent and Goal Directed  Orientation:  Full (Time, Place, and Person)  Thought Content:  WDL  Suicidal Thoughts:  No  Homicidal Thoughts:  No  Memory:  Immediate;   Fair Recent;   Fair Remote;   Fair  Judgement:  Fair  Insight:  Fair  Psychomotor Activity:  Decreased  Concentration:  Good  Recall:  [employer] of Knowledge:Fair  Language: Fair  Akathisia:  No  Handed:    AIMS (if indicated):     Assets:  Communication Skills Desire for Improvement Resilience Social Support  ADL's:  Intact   Cognition: WNL  Sleep:      Medical Decision Making: Self-Limited or Minor (1), Review of Psycho-Social Stressors (1), Review or order clinical lab tests (1), Review of Medication Regimen & Side Effects (2) and Review of New Medication or Change in Dosage (2)  Treatment Plan Summary: MDD (major depressive disorder), recurrent episode, moderate, improving, stable for discharge and outpatient treatment  Plan:  No evidence of imminent risk to self or others at present.   Patient does not meet criteria for psychiatric inpatient admission. Supportive therapy provided about ongoing stressors. Discussed crisis plan, support from social network, calling 911, coming to the Emergency Department, and calling Suicide Hotline.  Disposition:  -Discharge home -Give outpatient resources for psychiatry and counseling   Beau Fanny, FNP-BC 03/26/2015 10:25 AM

## 2015-03-26 NOTE — ED Provider Notes (Signed)
The patient has been seen by psychiatry, she is no longer suicidal, they have cleared her for discharge  Eber Hong, MD 03/26/15 1201

## 2015-03-26 NOTE — ED Notes (Addendum)
Visitor, daughter, Pedro Earls at bedside. Reports pt lives with her. Sitter at bedside.

## 2015-03-26 NOTE — ED Notes (Addendum)
Per Trula Ore, RN that had pt. This AM, pt. Did not eat lunch due to being picked up by family member prior to food arrival. Pt. AxO x4. Pt. In same state as when d/c per previous RN. Pt. With hx of dementia, mild confusion at baseline.   Per pt. Daughter, pt. Was left in the car while daughter went in for Dr. Astronomer. When daughter returned a bystander reported that pt. Had attempted to get out of car, bystander helped pt. Back to seat and stated that pt. Seemed confused.

## 2015-03-26 NOTE — ED Notes (Signed)
Pt denies wanting to hurt herself anymore; states she "just wants to go home" RN explained she will discuss this with MD and SW. Pt calm and cooperative eating breakfast.

## 2015-03-26 NOTE — Progress Notes (Signed)
Patient has been cleared by Psych MD. Outpatient resources given with DC paperwork. [name] who was here this morning will be coming to get patient from hospital.  Deretha Emory, MSW Clinical Social Work: Emergency Room [phone]

## 2015-03-26 NOTE — ED Notes (Addendum)
BH called and will do telepsyc evaluation. Telepsych machine at bedside.

## 2015-03-26 NOTE — Discharge Instructions (Signed)
Weakness Weakness is a lack of strength. It may be felt all over the body (generalized) or in one specific part of the body (focal). Some causes of weakness can be serious. You may need further medical evaluation, especially if you are elderly or you have a history of immunosuppression (such as chemotherapy or HIV), kidney disease, heart disease, or diabetes. CAUSES  Weakness can be caused by many different things, including:  Infection.  Physical exhaustion.  Internal bleeding or other blood loss that results in a lack of red blood cells (anemia).  Dehydration. This cause is more common in elderly people.  Side effects or electrolyte abnormalities from medicines, such as pain medicines or sedatives.  Emotional distress, anxiety, or depression.  Circulation problems, especially severe peripheral arterial disease.  Heart disease, such as rapid atrial fibrillation, bradycardia, or heart failure.  Nervous system disorders, such as Guillain-Barr syndrome, multiple sclerosis, or stroke. DIAGNOSIS  To find the cause of your weakness, your caregiver will take your history and perform a physical exam. Lab tests or X-rays may also be ordered, if needed. TREATMENT  Treatment of weakness depends on the cause of your symptoms and can vary greatly. HOME CARE INSTRUCTIONS   Rest as needed.  Eat a well-balanced diet.  Try to get some exercise every day.  Only take over-the-counter or prescription medicines as directed by your caregiver. SEEK MEDICAL CARE IF:   Your weakness seems to be getting worse or spreads to other parts of your body.  You develop new aches or pains. SEEK IMMEDIATE MEDICAL CARE IF:   You cannot perform your normal daily activities, such as getting dressed and feeding yourself.  You cannot walk up and down stairs, or you feel exhausted when you do so.  You have shortness of breath or chest pain.  You have difficulty moving parts of your body.  You have weakness  in only one area of the body or on only one side of the body.  You have a fever.  You have trouble speaking or swallowing.  You cannot control your bladder or bowel movements.  You have black or bloody vomit or stools. MAKE SURE YOU:  Understand these instructions.  Will watch your condition.  Will get help right away if you are not doing well or get worse. Document Released: 07/28/2005 Document Revised: 01/27/2012 Document Reviewed: 09/26/2011 [facility] Patient Information 2015 [place], [place]. This information is not intended to replace advice given to you by your health care provider. Make sure you discuss any questions you have with your health care provider. Depression Depression refers to feeling sad, low, down in the dumps, blue, gloomy, or empty. In general, there are two kinds of depression:  Normal sadness or normal grief. This kind of depression is one that we all feel from time to time after upsetting life experiences, such as the loss of a job or the ending of a relationship. This kind of depression is considered normal, is short lived, and resolves within a few days to 2 weeks. Depression experienced after the loss of a loved one (bereavement) often lasts longer than 2 weeks but normally gets better with time.  Clinical depression. This kind of depression lasts longer than normal sadness or normal grief or interferes with your ability to function at home, at work, and in school. It also interferes with your personal relationships. It affects almost every aspect of your life. Clinical depression is an illness. Symptoms of depression can also be caused by conditions other than  those mentioned above, such as:  Physical illness. Some physical illnesses, including underactive thyroid gland (hypothyroidism), severe anemia, specific types of cancer, diabetes, uncontrolled seizures, heart and lung problems, strokes, and chronic pain are commonly associated with symptoms of  depression.  Side effects of some prescription medicine. In some people, certain types of medicine can cause symptoms of depression.  Substance abuse. Abuse of alcohol and illicit drugs can cause symptoms of depression. SYMPTOMS Symptoms of normal sadness and normal grief include the following:  Feeling sad or crying for short periods of time.  Not caring about anything (apathy).  Difficulty sleeping or sleeping too much.  No longer able to enjoy the things you used to enjoy.  Desire to be by oneself all the time (social isolation).  Lack of energy or motivation.  Difficulty concentrating or remembering.  Change in appetite or weight.  Restlessness or agitation. Symptoms of clinical depression include the same symptoms of normal sadness or normal grief and also the following symptoms:  Feeling sad or crying all the time.  Feelings of guilt or worthlessness.  Feelings of hopelessness or helplessness.  Thoughts of suicide or the desire to harm yourself (suicidal ideation).  Loss of touch with reality (psychotic symptoms). Seeing or hearing things that are not real (hallucinations) or having false beliefs about your life or the people around you (delusions and paranoia). DIAGNOSIS  The diagnosis of clinical depression is usually based on how bad the symptoms are and how long they have lasted. Your health care provider will also ask you questions about your medical history and substance use to find out if physical illness, use of prescription medicine, or substance abuse is causing your depression. Your health care provider may also order blood tests. TREATMENT  Often, normal sadness and normal grief do not require treatment. However, sometimes antidepressant medicine is given for bereavement to ease the depressive symptoms until they resolve. The treatment for clinical depression depends on how bad the symptoms are but often includes antidepressant medicine, counseling with a  mental health professional, or both. Your health care provider will help to determine what treatment is best for you. Depression caused by physical illness usually goes away with appropriate medical treatment of the illness. If prescription medicine is causing depression, talk with your health care provider about stopping the medicine, decreasing the dose, or changing to another medicine. Depression caused by the abuse of alcohol or illicit drugs goes away when you stop using these substances. Some adults need professional help in order to stop drinking or using drugs. SEEK IMMEDIATE MEDICAL CARE IF:  You have thoughts about hurting yourself or others.  You lose touch with reality (have psychotic symptoms).  You are taking medicine for depression and have a serious side effect. FOR MORE INFORMATION  National Alliance on Mental Illness: www.nami.[employer] of Mental Health: http://www.maynard.net/ Document Released: 07/25/2000 Document Revised: 12/12/2013 Document Reviewed: 10/27/2011 [facility] Patient Information 2015 [place], [place]. This information is not intended to replace advice given to you by your health care provider. Make sure you discuss any questions you have with your health care provider. Dementia Dementia is a general term for problems with brain function. A person with dementia has memory loss and a hard time with at least one other brain function such as thinking, speaking, or problem solving. Dementia can affect social functioning, how you do your job, your mood, or your personality. The changes may be hidden for a long time. The earliest forms of this disease  are usually not detected by family or friends. Dementia can be:  Irreversible.  Potentially reversible.  Partially reversible.  Progressive. This means it can get worse over time. CAUSES  Irreversible dementia causes may include:  Degeneration of brain cells (Alzheimer disease or Lewy body  dementia).  Multiple small strokes (vascular dementia).  Infection (chronic meningitis or Creutzfeldt-Jakob disease).  Frontotemporal dementia. This affects younger people, age 54 to 48, compared to those who have Alzheimer disease.  Dementia associated with other disorders like Parkinson disease, Huntington disease, or HIV-associated dementia. Potentially or partially reversible dementia causes may include:  Medicines.  Metabolic causes such as excessive alcohol intake, vitamin B12 deficiency, or thyroid disease.  Masses or pressure in the brain such as a tumor, blood clot, or hydrocephalus. SIGNS AND SYMPTOMS  Symptoms are often hard to detect. Family members or coworkers may not notice them early in the disease process. Different people with dementia may have different symptoms. Symptoms can include:  A hard time with memory, especially recent memory. Long-term memory may not be impaired.  Asking the same question multiple times or forgetting something someone just said.  A hard time speaking your thoughts or finding certain words.  A hard time solving problems or performing familiar tasks (such as how to use a telephone).  Sudden changes in mood.  Changes in personality, especially increasing moodiness or mistrust.  Depression.  A hard time understanding complex ideas that were never a problem in the past. DIAGNOSIS  There are no specific tests for dementia.   Your health care provider may recommend a thorough evaluation. This is because some forms of dementia can be reversible. The evaluation will likely include a physical exam and getting a detailed history from you and a family member. The history often gives the best clues and suggestions for a diagnosis.  Memory testing may be done. A detailed brain function evaluation called neuropsychologic testing may be helpful.  Lab tests and brain imaging (such as a CT scan or MRI scan) are sometimes important.  Sometimes  observation and re-evaluation over time is very helpful. TREATMENT  Treatment depends on the cause.   If the problem is a vitamin deficiency, it may be helped or cured with supplements.  For dementias such as Alzheimer disease, medicines are available to stabilize or slow the course of the disease. There are no cures for this type of dementia.  Your health care provider can help direct you to groups, organizations, and other health care providers to help with decisions in the care of you or your loved one. HOME CARE INSTRUCTIONS The care of individuals with dementia is varied and dependent upon the progression of the dementia. The following suggestions are intended for the person living with, or caring for, the person with dementia.  Create a safe environment.  Remove the locks on bathroom doors to prevent the person from accidentally locking himself or herself in.  Use childproof latches on kitchen cabinets and any place where cleaning supplies, chemicals, or alcohol are kept.  Use childproof covers in unused electrical outlets.  Install childproof devices to keep doors and windows secured.  Remove stove knobs or install safety knobs and an automatic shut-off on the stove.  Lower the temperature on water heaters.  Label medicines and keep them locked up.  Secure knives, lighters, matches, power tools, and guns, and keep these items out of reach.  Keep the house free from clutter. Remove rugs or anything that might contribute to a fall.  Remove objects that might break and hurt the person.  Make sure lighting is good, both inside and outside.  Install grab rails as needed.  Use a monitoring device to alert you to falls or other needs for help.  Reduce confusion.  Keep familiar objects and people around.  Use night lights or dim lights at night.  Label items or areas.  Use reminders, notes, or directions for daily activities or tasks.  Keep a simple, consistent  routine for waking, meals, bathing, dressing, and bedtime.  Create a calm, quiet environment.  Place large clocks and calendars prominently.  Display emergency numbers and home address near all telephones.  Use cues to establish different times of the day. An example is to open curtains to let the natural light in during the day.   Use effective communication.  Choose simple words and short sentences.  Use a gentle, calm tone of voice.  Be careful not to interrupt.  If the person is struggling to find a word or communicate a thought, try to provide the word or thought.  Ask one question at a time. Allow the person ample time to answer questions. Repeat the question again if the person does not respond.  Reduce nighttime restlessness.  Provide a comfortable bed.  Have a consistent nighttime routine.  Ensure a regular walking or physical activity schedule. Involve the person in daily activities as much as possible.  Limit napping during the day.  Limit caffeine.  Attend social events that stimulate rather than overwhelm the senses.  Encourage good nutrition and hydration.  Reduce distractions during meal times and snacks.  Avoid foods that are too hot or too cold.  Monitor chewing and swallowing ability.  Continue with routine vision, hearing, dental, and medical screenings.  Give medicines only as directed by the health care provider.  Monitor driving abilities. Do not allow the person to drive when safe driving is no longer possible.  Register with an identification program which could provide location assistance in the event of a missing person situation. SEEK MEDICAL CARE IF:   New behavioral problems start such as moodiness, aggressiveness, or seeing things that are not there (hallucinations).  Any new problem with brain function happens. This includes problems with balance, speech, or falling a lot.  Problems with swallowing develop.  Any symptoms of  other illness happen. Small changes or worsening in any aspect of brain function can be a sign that the illness is getting worse. It can also be a sign of another medical illness such as infection. Seeing a health care provider right away is important. SEEK IMMEDIATE MEDICAL CARE IF:   A fever develops.  New or worsened confusion develops.  New or worsened sleepiness develops.  Staying awake becomes hard to do. Document Released: 01/21/2001 Document Revised: 12/12/2013 Document Reviewed: 12/23/2010 [facility] Patient Information 2015 [place], [place]. This information is not intended to replace advice given to you by your health care provider. Make sure you discuss any questions you have with your health care provider.

## 2015-03-26 NOTE — ED Notes (Signed)
Pt states she has no belongings with her. No inventory sheet in book. Pt has no belongings in locker. Pt will go home in paper scrubs. Waiting for daughter to arrive to take patient home.

## 2015-03-26 NOTE — ED Provider Notes (Signed)
CSN: [id number]     Arrival date & time 03/26/15  1358 History   First MD Initiated Contact with Patient 03/26/15 1554     Chief Complaint  Patient presents with  . Dizziness  . Altered Mental Status     (Consider location/radiation/quality/duration/timing/severity/associated sxs/prior Treatment) HPI Patient d/c from ED after psychiatric evaluation earlier today for SI. [name] took patient to her (the daughter's) doctor appointment and by report from staff, left her in the car. A by stander reportedly noted the patient trying to get out of the car and became concerned (it is a very hot day). Also, the report is that she had not eaten prior to leaving the ED and was weak and dizzy. There was expressed concern for heat illness. The patient is alert at the time of my evaluation and denies feeling unwell, denies pain or other distress. She lives home with her daughter whom she reports helps her with her needs in her home. At this time patient's daughter not in the room. Awaiting her return to complete HPI.  Daughter states patient stayed in car b/c she didn't want to come in with her. Reports windows were down, she thinks once she left the car, her mother became confused about where she was and panicked. Past Medical History  Diagnosis Date  . Hypertension   . Osteoarthritis   . Hypercholesteremia   . Type II diabetes mellitus   . Depression    Past Surgical History  Procedure Laterality Date  . Intramedullary (im) nail intertrochanteric Left 10/14/2012    Procedure: INTRAMEDULLARY (IM) NAIL INTERTROCHANTRIC;  Surgeon: Eulas Post, MD;  Location: [facility] OR;  Service: Orthopedics;  Laterality: Left;  . Cataract extraction w/ intraocular lens  implant, bilateral  1999-2004   History reviewed. No pertinent family history. Social History  Substance Use Topics  . Smoking status: Never Smoker   . Smokeless tobacco: Never Used  . Alcohol Use: No     Comment: Patient denies    OB  History    No data available     Review of Systems  10 Systems reviewed and are negative for acute change except as noted in the HPI.  Allergies  Celecoxib  Home Medications   Prior to Admission medications   Medication Sig Start Date End Date Taking? Authorizing Provider  aspirin 325 MG tablet Take 325 mg by mouth daily.   Yes Historical Provider, MD  calcium carbonate (OS-CAL) 600 MG TABS tablet Take 600 mg by mouth daily.   Yes Historical Provider, MD  Cyanocobalamin (VITAMIN B 12 PO) Take 1 tablet by mouth daily.   Yes Historical Provider, MD  feeding supplement, ENSURE ENLIVE, (ENSURE ENLIVE) LIQD Take 237 mLs by mouth 2 (two) times daily between meals. 12/17/14  Yes Jessica U Vann, DO   BP 146/67 mmHg  Pulse 81  Temp(Src) 98.4 F (36.9 C) (Oral)  Resp 24  SpO2 100% Physical Exam  Constitutional: She is oriented to person, place, and time. She appears well-developed and well-nourished. No distress.  Skin warm and dry with normal color. No respiratory distress. Calm and alert.  HENT:  Right Ear: External ear normal.  Left Ear: External ear normal.  Nose: Nose normal.  Mouth/Throat: Oropharynx is clear and moist.  Old resolving hematoma right brow (patient reports she had a bump above her brow and as it has resolved, the bruise has tracked down her cheek.)  Eyes: EOM are normal. Pupils are equal, round, and reactive to light. No  scleral icterus.  Neck: Neck supple.  Cardiovascular: Normal rate, regular rhythm, normal heart sounds and intact distal pulses.   Pulmonary/Chest: Effort normal and breath sounds normal.  Abdominal: Soft. Bowel sounds are normal. She exhibits no distension. There is no tenderness.  Musculoskeletal: Normal range of motion. She exhibits no edema or tenderness.  Neurological: She is alert and oriented to person, place, and time. She has normal strength. No cranial nerve deficit. She exhibits normal muscle tone. Coordination normal. GCS eye subscore is  4. GCS verbal subscore is 5. GCS motor subscore is 6.  Patient alert with situation appropriate conversation. Content appropriate.  Skin: Skin is warm, dry and intact. She is not diaphoretic.  Psychiatric: She has a normal mood and affect.    ED Course  Procedures (including critical care time) Labs Review Labs Reviewed  CBG MONITORING, ED - Abnormal; Notable for the following:    Glucose-Capillary 109 (*)    All other components within normal limits    Imaging Review No results found. I, Arby Barrette, personally reviewed and evaluated these images and lab results as part of my medical decision-making.   EKG Interpretation   Date/Time:  Monday March 26 2015 14:10:32 EDT Ventricular Rate:  91 PR Interval:  138 QRS Duration: 78 QT Interval:  354 QTC Calculation: 435 R Axis:   52 Text Interpretation:  Normal sinus rhythm Nonspecific ST and T wave  abnormality Abnormal ECG Since last tracing Nonspecific ST abnormality NOW  PRESENT Confirmed by MILLER  MD, BRIAN ([id number]) on 03/26/2015 3:39:36 PM      MDM   Final diagnoses:  Confusion, hx of, without neuro findings  History of dementia   No signs of heat related illness. Patient alert and nontoxic, VSS. Will be d/c to the care of her daughter.    Arby Barrette, MD 03/26/15 1726

## 2015-03-26 NOTE — ED Notes (Addendum)
Pt has eaten breakfast and is back in bed. Sitter at bedside.

## 2015-03-26 NOTE — ED Notes (Addendum)
BH called regarding possible reevaluation of placement; conveyed that pt is denying SI.

## 2015-03-26 NOTE — ED Notes (Addendum)
Patient is resting comfortably and has been asleep for visitor. Sitter at bedside.

## 2015-06-24 ENCOUNTER — Emergency Department (HOSPITAL_COMMUNITY)
Admission: EM | Admit: 2015-06-24 | Discharge: 2015-06-24 | Disposition: A | Discharge status: Home / Self Care | Payer: Medicare Other | Attending: Emergency Medicine | Admitting: Emergency Medicine

## 2015-06-24 ENCOUNTER — Encounter (HOSPITAL_COMMUNITY): Payer: Self-pay

## 2015-06-24 DIAGNOSIS — E119 Type 2 diabetes mellitus without complications: Secondary | ICD-10-CM | POA: Insufficient documentation

## 2015-06-24 DIAGNOSIS — M1990 Unspecified osteoarthritis, unspecified site: Secondary | ICD-10-CM | POA: Insufficient documentation

## 2015-06-24 DIAGNOSIS — K529 Noninfective gastroenteritis and colitis, unspecified: Secondary | ICD-10-CM | POA: Diagnosis not present

## 2015-06-24 DIAGNOSIS — Z79899 Other long term (current) drug therapy: Secondary | ICD-10-CM | POA: Diagnosis not present

## 2015-06-24 DIAGNOSIS — F329 Major depressive disorder, single episode, unspecified: Secondary | ICD-10-CM | POA: Insufficient documentation

## 2015-06-24 DIAGNOSIS — I10 Essential (primary) hypertension: Secondary | ICD-10-CM | POA: Diagnosis not present

## 2015-06-24 DIAGNOSIS — R350 Frequency of micturition: Secondary | ICD-10-CM | POA: Diagnosis not present

## 2015-06-24 DIAGNOSIS — R197 Diarrhea, unspecified: Secondary | ICD-10-CM | POA: Diagnosis present

## 2015-06-24 LAB — COMPREHENSIVE METABOLIC PANEL
ALBUMIN: 3.6 g/dL (ref 3.5–5.0)
ALK PHOS: 85 U/L (ref 38–126)
ALT: 13 U/L — ABNORMAL LOW (ref 14–54)
ANION GAP: 6 (ref 5–15)
AST: 25 U/L (ref 15–41)
BUN: 18 mg/dL (ref 6–20)
CALCIUM: 9.4 mg/dL (ref 8.9–10.3)
CHLORIDE: 105 mmol/L (ref 101–111)
CO2: 28 mmol/L (ref 22–32)
Creatinine, Ser: 0.69 mg/dL (ref 0.44–1.00)
GFR calc non Af Amer: >60 mL/min (ref >60)
Glucose, Bld: 103 mg/dL — ABNORMAL HIGH (ref 65–99)
POTASSIUM: 3.8 mmol/L (ref 3.5–5.1)
SODIUM: 139 mmol/L (ref 135–145)
Total Bilirubin: 0.5 mg/dL (ref 0.3–1.2)
Total Protein: 7.3 g/dL (ref 6.5–8.1)

## 2015-06-24 LAB — CBC WITH DIFFERENTIAL/PLATELET
BASOS ABS: 0 10*3/uL (ref 0.0–0.1)
BASOS PCT: 1 %
EOS ABS: 0.1 10*3/uL (ref 0.0–0.7)
Eosinophils Relative: 1 %
HEMATOCRIT: 38.2 % (ref 36.0–46.0)
HEMOGLOBIN: 11.9 g/dL — AB (ref 12.0–15.0)
Lymphocytes Relative: 10 %
Lymphs Abs: 0.6 10*3/uL — ABNORMAL LOW (ref 0.7–4.0)
MCH: 27.4 pg (ref 26.0–34.0)
MCHC: 31.2 g/dL (ref 30.0–36.0)
MCV: 87.8 fL (ref 78.0–100.0)
MONOS PCT: 7 %
Monocytes Absolute: 0.4 10*3/uL (ref 0.1–1.0)
NEUTROS ABS: 5 10*3/uL (ref 1.7–7.7)
NEUTROS PCT: 81 %
PLATELETS: 187 10*3/uL (ref 150–400)
RBC: 4.35 MIL/uL (ref 3.87–5.11)
RDW: 16.2 % — ABNORMAL HIGH (ref 11.5–15.5)
WBC: 6.1 10*3/uL (ref 4.0–10.5)

## 2015-06-24 LAB — URINALYSIS, ROUTINE W REFLEX MICROSCOPIC
BILIRUBIN URINE: NEGATIVE
GLUCOSE, UA: NEGATIVE mg/dL
HGB URINE DIPSTICK: NEGATIVE
KETONES UR: NEGATIVE mg/dL
Leukocytes, UA: NEGATIVE
NITRITE: NEGATIVE
PH: 5.5 (ref 5.0–8.0)
Protein, ur: 30 mg/dL — AB
SPECIFIC GRAVITY, URINE: 1.022 (ref 1.005–1.030)
UROBILINOGEN UA: 1 mg/dL (ref 0.0–1.0)

## 2015-06-24 LAB — LIPASE, BLOOD: Lipase: 29 U/L (ref 11–51)

## 2015-06-24 LAB — URINE MICROSCOPIC-ADD ON

## 2015-06-24 MED ORDER — SODIUM CHLORIDE 0.9 % IV BOLUS (SEPSIS): 1000.0000 mL | Freq: Once | INTRAVENOUS | Status: DC

## 2015-06-24 MED ORDER — ONDANSETRON 4 MG PO TBDP: 4.0000 mg | ORAL_TABLET | Freq: Three times a day (TID) | ORAL | Status: DC | PRN

## 2015-06-24 MED ORDER — SODIUM CHLORIDE 0.9 % IV BOLUS (SEPSIS)
1000.0000 mL | Freq: Once | INTRAVENOUS | Status: AC
  Administered 2015-06-24: 1000 mL via INTRAVENOUS

## 2015-06-24 NOTE — ED Notes (Signed)
Pt accompanied with daughter.  Onset this morning pt had large watery stool and lower abd cramping.  Abd cramping resolved after having BM.  Pt lives with daughter who was d/c from hospital 1 week ago with flu, diarrhea, vomiting.  No c/o pain at this time.  Acting well, eating/drinking WNL prior to diarrhea.

## 2015-06-24 NOTE — ED Provider Notes (Signed)
CSN: [id number]     Arrival date & time 06/24/15  1148 History   First MD Initiated Contact with Patient 06/24/15 1254     Chief Complaint  Patient presents with  . Diarrhea     (Consider location/radiation/quality/duration/timing/severity/associated sxs/prior Treatment) [name] presenting with diarrhea.  Diarrhea Quality:  Watery Severity:  Severe Onset quality:  Sudden Number of episodes:  1 Progression since onset: unsure, just started PTA, no episodes on arrival to ED. Relieved by:  Nothing Worsened by:  Nothing tried Associated symptoms: no abdominal pain, no recent cough, no fever, no headaches, no myalgias, no URI and no vomiting   Risk factors: sick contacts (daugher recently admitted for colitis)   Risk factors: no recent antibiotic use and no travel to endemic areas     Past Medical History  Diagnosis Date  . Hypertension   . Osteoarthritis   . Hypercholesteremia   . Type II diabetes mellitus (HCC)   . Depression    Past Surgical History  Procedure Laterality Date  . Intramedullary (im) nail intertrochanteric Left 10/14/2012    Procedure: INTRAMEDULLARY (IM) NAIL INTERTROCHANTRIC;  Surgeon: Eulas PostJoshua P Landau, MD;  Location: [facility];  Service: Orthopedics;  Laterality: Left;  . Cataract extraction w/ intraocular lens  implant, bilateral  1999-2004   History reviewed. No pertinent family history. Social History  Substance Use Topics  . Smoking status: Never Smoker   . Smokeless tobacco: Never Used  . Alcohol Use: No     Comment: Patient denies    OB History    No data available     Review of Systems  Constitutional: Negative for fever.  HENT: Negative for sore throat.   Eyes: Negative for visual disturbance.  Respiratory: Negative for cough and shortness of breath.   Cardiovascular: Negative for chest pain.  Gastrointestinal: Positive for nausea and diarrhea. Negative for vomiting, abdominal pain and constipation.  Genitourinary:  Positive for frequency. Negative for difficulty urinating.  Musculoskeletal: Negative for myalgias, back pain and neck pain.  Skin: Negative for rash.  Neurological: Negative for syncope and headaches.      Allergies  Celecoxib  Home Medications   Prior to Admission medications   Medication Sig Start Date End Date Taking? Authorizing Provider  acetaminophen (TYLENOL) 500 MG tablet Take 500-1,000 mg by mouth daily as needed for headache (pain).   Yes Historical Provider, MD  calcium carbonate (OS-CAL) 600 MG TABS tablet Take 600 mg by mouth daily.   Yes Historical Provider, MD  Cyanocobalamin (VITAMIN B 12 PO) Take 1 tablet by mouth daily.   Yes Historical Provider, MD  feeding supplement, ENSURE ENLIVE, (ENSURE ENLIVE) LIQD Take 237 mLs by mouth 2 (two) times daily between meals. 12/17/14  Yes Joseph ArtJessica U Vann, DO  Multiple Vitamin (MULTIVITAMIN WITH MINERALS) TABS tablet Take 1 tablet by mouth daily. Centrum Silver   Yes Historical Provider, MD  sertraline (ZOLOFT) 25 MG tablet Take 25 mg by mouth at bedtime.   Yes Historical Provider, MD  ondansetron (ZOFRAN ODT) 4 MG disintegrating tablet Take 1 tablet (4 mg total) by mouth every 8 (eight) hours as needed for nausea or vomiting. 06/24/15   Alvira MondayErin Florean Hoobler, MD   BP 160/75 mmHg  Pulse 65  Temp(Src) 98.6 F (37 C) (Oral)  Resp 16  Ht 5\' 5"  (1.651 m)  Wt 112 lb 3.2 oz (50.894 kg)  BMI 18.67 kg/m2  SpO2 99% Physical Exam  Constitutional: She is oriented to person, place, and time.  She appears well-developed and well-nourished. No distress.  HENT:  Head: Normocephalic and atraumatic.  Mouth/Throat: No oropharyngeal exudate.  Eyes: Conjunctivae and EOM are normal. Pupils are equal, round, and reactive to light.  Neck: Normal range of motion.  Cardiovascular: Normal rate, regular rhythm, normal heart sounds and intact distal pulses.  Exam reveals no gallop and no friction rub.   No murmur heard. Pulmonary/Chest: Effort normal and  breath sounds normal. No respiratory distress. She has no wheezes. She has no rales.  Abdominal: Soft. She exhibits no distension. There is no tenderness. There is no guarding.  Musculoskeletal: She exhibits no edema or tenderness.  Neurological: She is alert and oriented to person, place, and time. GCS eye subscore is 4. GCS verbal subscore is 5. GCS motor subscore is 6.  Skin: Skin is warm and dry. No rash noted. She is not diaphoretic. No erythema.  Nursing note and vitals reviewed.   ED Course  Procedures (including critical care time) Labs Review Labs Reviewed  CBC WITH DIFFERENTIAL/PLATELET - Abnormal; Notable for the following:    Hemoglobin 11.9 (*)    RDW 16.2 (*)    Lymphs Abs 0.6 (*)    All other components within normal limits  COMPREHENSIVE METABOLIC PANEL - Abnormal; Notable for the following:    Glucose, Bld 103 (*)    ALT 13 (*)    All other components within normal limits  URINALYSIS, ROUTINE W REFLEX MICROSCOPIC (NOT AT [facility]) - Abnormal; Notable for the following:    Color, Urine AMBER (*)    Protein, ur 30 (*)    All other components within normal limits  URINE CULTURE  LIPASE, BLOOD  URINE MICROSCOPIC-ADD ON    Imaging Review No results found. I have personally reviewed and evaluated these images and lab results as part of my medical decision-making.   EKG Interpretation None      MDM   Final diagnoses:  Gastroenteritis   81 year old female with a history of major depression, hypertension, diabetes, CVA presents with concern of one episode of loose watery stool this morning, and urinary frequency for 3 weeks.  Daughter believes she's had more episodes of confusion over the last several weeks.  She has no focal neurologic signs or symptoms and doubt CVA. Her abdominal exam is benign and have low suspicion for significant intra-abdominal pathology. [name] was just discharged from hospital with concern of a possible colitis with symptoms of  diarrhea, nausea and vomiting, and it is likely that patient has a beginning of these symptoms. She did not have any other episodes of diarrhea while in the emergency department, remained hemodynamically stable, has electrolytes within normal limits.  Urinalysis obtained given history of urinary frequency and shows no abnormalities. Patient discharged in stable condition with understanding of reasons to return and provided rx for zofran.    Alvira Monday, MD 06/25/15 1240

## 2015-06-25 LAB — URINE CULTURE

## 2015-07-08 ENCOUNTER — Encounter (HOSPITAL_COMMUNITY): Payer: Self-pay

## 2015-07-08 ENCOUNTER — Emergency Department (HOSPITAL_COMMUNITY)
Admission: EM | Admit: 2015-07-08 | Discharge: 2015-07-09 | Disposition: A | Discharge status: Home / Self Care | Payer: Medicare Other | Attending: Emergency Medicine | Admitting: Emergency Medicine

## 2015-07-08 DIAGNOSIS — I10 Essential (primary) hypertension: Secondary | ICD-10-CM | POA: Diagnosis not present

## 2015-07-08 DIAGNOSIS — F4329 Adjustment disorder with other symptoms: Secondary | ICD-10-CM | POA: Diagnosis present

## 2015-07-08 DIAGNOSIS — Z8739 Personal history of other diseases of the musculoskeletal system and connective tissue: Secondary | ICD-10-CM | POA: Diagnosis not present

## 2015-07-08 DIAGNOSIS — E119 Type 2 diabetes mellitus without complications: Secondary | ICD-10-CM | POA: Insufficient documentation

## 2015-07-08 DIAGNOSIS — Z79899 Other long term (current) drug therapy: Secondary | ICD-10-CM | POA: Diagnosis not present

## 2015-07-08 DIAGNOSIS — F329 Major depressive disorder, single episode, unspecified: Secondary | ICD-10-CM | POA: Insufficient documentation

## 2015-07-08 DIAGNOSIS — R627 Adult failure to thrive: Secondary | ICD-10-CM | POA: Diagnosis present

## 2015-07-08 LAB — RAPID URINE DRUG SCREEN, HOSP PERFORMED
Amphetamines: NOT DETECTED
BARBITURATES: NOT DETECTED
Benzodiazepines: NOT DETECTED
COCAINE: NOT DETECTED
OPIATES: NOT DETECTED
TETRAHYDROCANNABINOL: NOT DETECTED

## 2015-07-08 LAB — URINALYSIS, ROUTINE W REFLEX MICROSCOPIC
Bilirubin Urine: NEGATIVE
GLUCOSE, UA: NEGATIVE mg/dL
Hgb urine dipstick: NEGATIVE
KETONES UR: NEGATIVE mg/dL
LEUKOCYTES UA: NEGATIVE
Nitrite: NEGATIVE
Protein, ur: 100 mg/dL — AB
Specific Gravity, Urine: 1.02 (ref 1.005–1.030)
pH: 7 (ref 5.0–8.0)

## 2015-07-08 LAB — COMPREHENSIVE METABOLIC PANEL
ALK PHOS: 70 U/L (ref 38–126)
ALT: 13 U/L — ABNORMAL LOW (ref 14–54)
ANION GAP: 5 (ref 5–15)
AST: 24 U/L (ref 15–41)
Albumin: 4.2 g/dL (ref 3.5–5.0)
BILIRUBIN TOTAL: 0.7 mg/dL (ref 0.3–1.2)
BUN: 19 mg/dL (ref 6–20)
CALCIUM: 9.3 mg/dL (ref 8.9–10.3)
CO2: 30 mmol/L (ref 22–32)
Chloride: 105 mmol/L (ref 101–111)
Creatinine, Ser: 0.67 mg/dL (ref 0.44–1.00)
GFR calc non Af Amer: >60 mL/min (ref >60)
Glucose, Bld: 115 mg/dL — ABNORMAL HIGH (ref 65–99)
Potassium: 4.5 mmol/L (ref 3.5–5.1)
Sodium: 140 mmol/L (ref 135–145)
TOTAL PROTEIN: 7.9 g/dL (ref 6.5–8.1)

## 2015-07-08 LAB — CBC
HCT: 40.7 % (ref 36.0–46.0)
Hemoglobin: 12.4 g/dL (ref 12.0–15.0)
MCH: 27.3 pg (ref 26.0–34.0)
MCHC: 30.5 g/dL (ref 30.0–36.0)
MCV: 89.5 fL (ref 78.0–100.0)
Platelets: 168 10*3/uL (ref 150–400)
RBC: 4.55 MIL/uL (ref 3.87–5.11)
RDW: 15.9 % — AB (ref 11.5–15.5)
WBC: 4.3 10*3/uL (ref 4.0–10.5)

## 2015-07-08 LAB — ACETAMINOPHEN LEVEL

## 2015-07-08 LAB — URINE MICROSCOPIC-ADD ON
BACTERIA UA: NONE SEEN
RBC / HPF: NONE SEEN RBC/hpf (ref 0–5)
SQUAMOUS EPITHELIAL / LPF: NONE SEEN

## 2015-07-08 LAB — ETHANOL: Alcohol, Ethyl (B): <5 mg/dL (ref <5)

## 2015-07-08 LAB — SALICYLATE LEVEL

## 2015-07-08 MED ORDER — CALCIUM CARBONATE 1250 (500 CA) MG PO TABS
1.0000 | ORAL_TABLET | Freq: Every day | ORAL | Status: DC
  Administered 2015-07-08 – 2015-07-09 (×2): 500 mg via ORAL
  Filled 2015-07-08 (×2): qty 1

## 2015-07-08 MED ORDER — ACETAMINOPHEN 500 MG PO TABS: 500.0000 mg | ORAL_TABLET | Freq: Every day | ORAL | Status: DC | PRN

## 2015-07-08 MED ORDER — CALCIUM CARBONATE 600 MG PO TABS: 600.0000 mg | ORAL_TABLET | Freq: Every day | ORAL | Status: DC

## 2015-07-08 NOTE — ED Notes (Signed)
Per EMS- Patient's granddaughter called patient and patient said she "just going to lay down and die." patient told EMS that she has "already lived her life and now it is time to die." patient has not been taking her Seroquel or zofran that is prescribed.

## 2015-07-08 NOTE — ED Notes (Addendum)
Pt belongings:Long sleeve white shirt, black pants, socks, shoes, brown purse blue shoes locker 29

## 2015-07-08 NOTE — ED Notes (Signed)
Patient has attempted several times to get OOB. Patient states, "I want to get out of here."Patient then states, "You have no clue what my life has been like." patient assisted back in bed.

## 2015-07-08 NOTE — ED Notes (Signed)
Patient stated, "I am just in the way. I feel like it would be good for everyone if I just went away and get out of everyone's way." Patient stated, "I would not kill myself, but I wouldn't mind if I just went away and died." patient stated she feels hopeless. Patient states she has not taken her Seroquel for the past 2 months. Patient also reports that she is not hungry and has not been eating much.

## 2015-07-08 NOTE — BH Assessment (Signed)
Assessment Note  Brandy Bailey is an 81 y.o. female, who called her Brandy Bailey and stated "I'm going to sleep, wanted to tell you goodbye" as reported by Brandy Bailey, Brandy [employer].  Brandy Bailey reports she called an ambulance to transport the Patient to [facility].  The Patient presented orientated x4, mood "sad and depressed", affect flat and congruent with mood, Patient denied current SI, HI, and AVH.  The Patient reports feeling depressed for several weeks and sad about the death of 3 children and Spouse and being a current burden on her Bailey.  Brandy Brandy Bailey reports her Mother (Brandy Bailey) is an "alcoholic" and is currently hospitalized at [facility].  The Brandy Bailey reports her Mother was residing with the Patient prior to being hospitalized.  The Patient reports being unaware of the whereabouts of her Bailey.  Patient reports difficulty getting to sleep because of worrying about being a burden, eating only a small breakfast and a light dinner and not having an appetite to eat more (Patient reports losing over 80 lbs during the past year).  Patient reports previously attending church 2 days per week, however sporadic now due to lack of transportation.  The Patient denied any type of substance use.  Patient reports difficulty hearing and using a walker periodically to ambulate.  Patient 's Kris Mouton Bailey reports the Patient is perscribed Seroquel for depression , however she will not take the medication and that Zoloft was prescribed in the past and the Patient would not take it.       Diagnosis:Major Depressive Disorder, recent, severe  Past Medical History:  Past Medical History  Diagnosis Date  . Hypertension   . Osteoarthritis   . Hypercholesteremia   . Type II diabetes mellitus (HCC)   . Depression     Past Surgical History  Procedure Laterality Date  . Intramedullary (im) nail intertrochanteric Left 10/14/2012    Procedure: INTRAMEDULLARY  (IM) NAIL INTERTROCHANTRIC;  Surgeon: Eulas Post, MD;  Location: [facility] OR;  Service: Orthopedics;  Laterality: Left;  . Cataract extraction w/ intraocular lens  implant, bilateral  1999-2004    Family History:  Family History  Problem Relation Age of Onset  . Family history unknown: Yes    Social History:  reports that she has never smoked. She has never used smokeless tobacco. She reports that she does not drink alcohol or use illicit drugs.  Additional Social History:     CIWA: CIWA-Ar BP: 143/76 mmHg Pulse Rate: 88 COWS:    Allergies:  Allergies  Allergen Reactions  . Celecoxib Hives    Reaction to Celebrex    Home Medications:  (Not in a hospital admission)  OB/GYN Status:  No LMP recorded. Patient is postmenopausal.  General Assessment Data Location of Assessment: [facility] TTS Assessment: In system Is this a Tele or Face-to-Face Assessment?: Tele Assessment Is this an Initial Assessment or a Re-assessment for this encounter?: Initial Assessment Marital status: Widowed Brandy Bailey Is patient pregnant?: No Pregnancy Status: No Living Arrangements: Alone (Bailey live with her prior to being hospitalized.) Can pt return to current living arrangement?: Yes Admission Status: Voluntary Is patient capable of signing voluntary admission?: Yes Referral Source: Self/Family/Friend Cabin crew Bailey) Insurance type: Science writer Exam ([facility] Walk-in ONLY) Medical Exam completed: Yes  Crisis Care Plan Living Arrangements: Alone (Bailey live with her prior to being hospitalized.) Name of Psychiatrist: None Name of Therapist: None  Education Status Is patient currently in school?:  No Current Grade: N/A Highest grade of school patient has completed: 12th Name of school: N/A Contact person: N/A  Risk to self with the past 6 months Suicidal Ideation: No-Not Currently/Within Last 6 Months (Patient reports wanting to go to sleep, told  Brandy Bailey ) Has patient been a risk to self within the past 6 months prior to admission? : No Suicidal Intent: No Has patient had any suicidal intent within the past 6 months prior to admission? : No Is patient at risk for suicide?: Yes Suicidal Plan?: No Has patient had any suicidal plan within the past 6 months prior to admission? : No Access to Means: No Brandy Bailey unsure if there is a gun in the home.) What has been your use of drugs/alcohol within the last 12 months?: None Previous Attempts/Gestures: No How many times?: 0 Other Self Harm Risks: None Triggers for Past Attempts: Unknown Intentional Self Injurious Behavior: None Family Suicide History: Unknown Recent stressful life event(s): Other (Comment) (Death of children and Spouse over time) Persecutory voices/beliefs?: No Depression: Yes Depression Symptoms: Isolating, Feeling worthless/self pity, Loss of interest in usual pleasures (sleep difficulty) Substance abuse history and/or treatment for substance abuse?: No Suicide prevention information given to non-admitted patients: Not applicable  Risk to Others within the past 6 months Homicidal Ideation: No Does patient have any lifetime risk of violence toward others beyond the six months prior to admission? : No Thoughts of Harm to Others: No Current Homicidal Intent: No Current Homicidal Plan: No Access to Homicidal Means: No Identified Victim: N/A History of harm to others?: No Assessment of Violence: None Noted Violent Behavior Description: N/A Does patient have access to weapons?: No [facility] Bailey not sure if gun in the home.) Criminal Charges Pending?: No Does patient have a court date: No Is patient on probation?: No  Psychosis Hallucinations: None noted Delusions: None noted  Mental Status Report Appearance/Hygiene: In hospital gown Eye Contact: Fair Motor Activity: Unremarkable Speech: Logical/coherent, Soft Level of Consciousness:  Alert Mood: Depressed, Sad Affect: Flat, Sad, Depressed Anxiety Level: Minimal Thought Processes: Coherent, Relevant Judgement: Impaired Orientation: Person, Place, Time, Situation Obsessive Compulsive Thoughts/Behaviors: None  Cognitive Functioning Concentration: Decreased Memory: Remote Intact, Recent Intact IQ: Average Insight: Poor Impulse Control: Fair Appetite: Poor Weight Loss: 80 (over past year) Weight Gain: 0 Sleep: Decreased Total Hours of Sleep: 9 Vegetative Symptoms: None  ADLScreening [facility] Assessment Services) Brandy cognitive ability adequate to safely complete daily activities?: Yes Patient able to express need for assistance with ADLs?: Yes Independently performs ADLs?: Yes (appropriate for developmental age)  Prior Inpatient Therapy Prior Inpatient Therapy: No Prior Therapy Dates: N/A Prior Therapy Facilty/Provider(s): None Reason for Treatment: None  Prior Outpatient Therapy Prior Outpatient Therapy: No Prior Therapy Dates: N/A Prior Therapy Facilty/Provider(s): N/A Reason for Treatment: N/A Does patient have an ACCT team?: No Does patient have Intensive In-House Services?  : No Does patient have [facility] services? : No Does patient have P4CC services?: No  ADL Screening (condition at time of admission) Brandy cognitive ability adequate to safely complete daily activities?: Yes Is the patient deaf or have difficulty hearing?: Yes (Patient has difficulty hearing.) Does the patient have difficulty seeing, even when wearing glasses/contacts?: No Does the patient have difficulty concentrating, remembering, or making decisions?: No Patient able to express need for assistance with ADLs?: Yes Does the patient have difficulty dressing or bathing?: No Independently performs ADLs?: Yes (appropriate for developmental age) Does the patient have difficulty walking or climbing stairs?: Yes (Patient uses  a walker periodically.) Weakness of Legs:  None Weakness of Arms/Hands: None  Home Assistive Devices/Equipment Home Assistive Devices/Equipment: Walker (specify type)      Values / Beliefs Cultural Requests During Hospitalization: None Spiritual Requests During Hospitalization: None        Additional Information 1:1 In Past 12 Months?: No CIRT Risk: No Elopement Risk: No Does patient have medical clearance?: Yes     Disposition:  Disposition Initial Assessment Completed for this Encounter: Yes Disposition of Patient: Inpatient treatment program ([place]) Type of inpatient treatment program: Adult [facility])  On Site Evaluation by:   Reviewed with Physician:    Dey-Johnson,Levon Penning 07/08/2015 4:42 PM

## 2015-07-08 NOTE — ED Provider Notes (Signed)
CSN: [id number]     Arrival date & time 07/08/15  1402 History   First MD Initiated Contact with Patient 07/08/15 1504     Chief Complaint  Patient presents with  . Failure To Thrive  . Suicidal     (Consider location/radiation/quality/duration/timing/severity/associated sxs/prior Treatment) HPI Comments: Patient presents complaining of increased depression with SI w/o a plan. No prior h/o sa. No SI. Pt states that if she would like it if she ' went to sleep and didn't wake up' denies any current sx. .  Symptoms have been present for several months and worse today. Pt non- compliant w/ seroquesl. The symptoms are made worse by worse. The symptoms are made better by nothing. The patient has tried the following therapies: none. . The patient has seen the following providers for this complaint: non-one.  Pt denies any recent illnesses  The history is provided by the patient.    Past Medical History  Diagnosis Date  . Hypertension   . Osteoarthritis   . Hypercholesteremia   . Type II diabetes mellitus (HCC)   . Depression    Past Surgical History  Procedure Laterality Date  . Intramedullary (im) nail intertrochanteric Left 10/14/2012    Procedure: INTRAMEDULLARY (IM) NAIL INTERTROCHANTRIC;  Surgeon: Eulas Post, MD;  Location: [facility] OR;  Service: Orthopedics;  Laterality: Left;  . Cataract extraction w/ intraocular lens  implant, bilateral  1999-2004   Family History  Problem Relation Age of Onset  . Family history unknown: Yes   Social History  Substance Use Topics  . Smoking status: Never Smoker   . Smokeless tobacco: Never Used  . Alcohol Use: No     Comment: Patient denies    OB History    No data available     Review of Systems  All other systems reviewed and are negative.     Allergies  Celecoxib  Home Medications   Prior to Admission medications   Medication Sig Start Date End Date Taking? Authorizing Provider  acetaminophen (TYLENOL) 500 MG tablet  Take 500-1,000 mg by mouth daily as needed for headache (pain).   Yes Historical Provider, MD  calcium carbonate (OS-CAL) 600 MG TABS tablet Take 600 mg by mouth daily.   Yes Historical Provider, MD  feeding supplement, ENSURE ENLIVE, (ENSURE ENLIVE) LIQD Take 237 mLs by mouth 2 (two) times daily between meals. 12/17/14  Yes Joseph Art, DO  Ketotifen Fumarate (ITCHY EYE DROPS OP) Place 1 drop into the left eye daily as needed (itching).   Yes Historical Provider, MD  ondansetron (ZOFRAN ODT) 4 MG disintegrating tablet Take 1 tablet (4 mg total) by mouth every 8 (eight) hours as needed for nausea or vomiting. 06/24/15  Yes Alvira Monday, MD   BP 131/86 mmHg  Pulse 69  Temp(Src) 97.6 F (36.4 C) (Oral)  Resp 16  Ht  (1.651 m)  Wt 50.803 kg  BMI 18.64 kg/m2  SpO2 97% Physical Exam  Constitutional: She is oriented to person, place, and time. She appears well-developed and well-nourished.  Non-toxic appearance. No distress.  HENT:  Head: Normocephalic and atraumatic.  Eyes: Conjunctivae, EOM and lids are normal. Pupils are equal, round, and reactive to light.  Neck: Normal range of motion. Neck supple. No tracheal deviation present. No thyroid mass present.  Cardiovascular: Normal rate, regular rhythm and normal heart sounds.  Exam reveals no gallop.   No murmur heard. Pulmonary/Chest: Effort normal and breath sounds normal. No stridor. No respiratory distress. She has  no decreased breath sounds. She has no wheezes. She has no rhonchi. She has no rales.  Abdominal: Soft. Normal appearance and bowel sounds are normal. She exhibits no distension. There is no tenderness. There is no rebound and no CVA tenderness.  Musculoskeletal: Normal range of motion. She exhibits no edema or tenderness.  Neurological: She is alert and oriented to person, place, and time. She has normal strength. No cranial nerve deficit or sensory deficit. GCS eye subscore is 4. GCS verbal subscore is 5. GCS motor  subscore is 6.  Skin: Skin is warm and dry. No abrasion and no rash noted.  Psychiatric: Her affect is blunt. Her speech is delayed. She is slowed. She expresses no suicidal plans and no homicidal plans.  Nursing note and vitals reviewed.   ED Course  Procedures (including critical care time) Labs Review Labs Reviewed  URINE CULTURE  COMPREHENSIVE METABOLIC PANEL  ETHANOL  SALICYLATE LEVEL  ACETAMINOPHEN LEVEL  CBC  URINE RAPID DRUG SCREEN, HOSP PERFORMED  URINALYSIS, ROUTINE W REFLEX MICROSCOPIC (NOT AT [facility])    Imaging Review No results found. I have personally reviewed and evaluated these images and lab results as part of my medical decision-making.   EKG Interpretation None      MDM   Final diagnoses:  None    Patient given IV fluids and will be seen by psychiatry for final disposition    Lorre NickAnthony Silvino Selman, MD 07/12/15 2348

## 2015-07-08 NOTE — ED Notes (Signed)
Patient lives with her daughter, but the daughter is currently in a psychiatric facility. The grand daughter left and stated if someone is needed to call. Brandy Bailey/grand daughter [phone]

## 2015-07-09 DIAGNOSIS — F4329 Adjustment disorder with other symptoms: Secondary | ICD-10-CM

## 2015-07-09 NOTE — BH Assessment (Signed)
[facility] Assessment Progress Note  Per Thedore MinsMojeed Akintayo, MD, this patient does not require psychiatric hospitalization at this time. [facility]. This Clinical research associatewriter spoke to the pt. She has asked me to schedule an appointment for her with a local provider.  At 13:05 I spoke to [place] at [facility].  An appointment has been scheduled for the pt with Judithe ModestSusan Bond, LCSW.  The appointment is for tomorrow, Tuesday, 07/10/2015, at 14:00; they ask that pt arrive 15 minutes early for the first visit.  This information, along with [facility]'s cancellation policy, has been included in pt's discharge instructions. Pt's nurse, Morrie Sheldonshley, has been notified.  Doylene Canninghomas Bellagrace Sylvan, MA Triage Specialist [phone]

## 2015-07-09 NOTE — [facility] Suicide Risk Assessment (Signed)
Suicide Risk Assessment  Discharge Assessment   [facility]   Demographic Factors:  Age 81 or older, caucasian  Total Time spent with patient: 45 minutes  Musculoskeletal: Strength & Muscle Tone: decreased Gait & Station: unsteady Patient leans: N/A  Psychiatric Specialty Exam: Review of Systems  Constitutional: Negative.   HENT: Negative.   Eyes: Negative.   Respiratory: Negative.   Cardiovascular: Negative.   Gastrointestinal: Negative.   Genitourinary: Negative.   Musculoskeletal: Negative.   Skin: Negative.   Neurological: Negative.   Endo/Heme/Allergies: Negative.   Psychiatric/Behavioral:       Negative     Blood pressure 159/96, pulse 76, temperature 98.3 F (36.8 C), temperature source Oral, resp. rate 18, height 5\' 5"  (1.651 m), weight 50.803 kg (112 lb), SpO2 99 %.Body mass index is 18.64 kg/(m^2).  General Appearance: Casual  Eye Contact::  Good  Speech:  Normal Rate  Volume:  Normal  Mood:  Euthymic  Affect:  Congruent  Thought Process:  Coherent  Orientation:  Full (Time, Place, and Person)  Thought Content:  WDL  Suicidal Thoughts:  No  Homicidal Thoughts:  No  Memory:  Immediate;   Good Recent;   Good Remote;   Good  Judgement:  Fair  Insight:  Good  Psychomotor Activity:  Normal  Concentration:  Good  Recall:  Good  Fund of Knowledge:Good  Language: Good  Akathisia:  No  Handed:  Right  AIMS (if indicated):     Assets:  Housing Leisure Time Resilience Social Support  ADL's:  Intact  Cognition: WNL  Sleep:         Has this patient used any form of tobacco in the last 30 days? (Cigarettes, Smokeless Tobacco, Cigars, and/or Pipes) No  Mental Status Per Nursing Assessment::   On Admission:   Statements of readiness to die  Current Mental Status by Physician: NA  Loss Factors: NA  Historical Factors: NA  Risk Reduction Factors:   NA  Continued Clinical Symptoms:  None  Cognitive Features That  Contribute To Risk:  None    Suicide Risk:  Minimal: No identifiable suicidal ideation.  Patients presenting with no risk factors but with morbid ruminations; may be classified as minimal risk based on the severity of the depressive symptoms  Principal Problem: Adjustment disorder with disturbance of emotion Discharge Diagnoses:  Patient Active Problem List   Diagnosis Date Noted  . Adjustment disorder with disturbance of emotion [F43.29] 07/09/2015    Priority: High  . MDD (major depressive disorder), recurrent episode, moderate (HCC) [F33.1] 03/26/2015  . Protein-calorie malnutrition, severe (HCC) [E43] 12/16/2014  . Cellulitis of right foot [L03.115] 12/15/2014  . Cellulitis [L03.90] 12/15/2014  . Depression [F32.9] 03/10/2013  . Acute encephalopathy [G93.40] 02/21/2013  . History of CVA (cerebrovascular accident) [Z86.73] 02/21/2013  . Acute blood loss anemia [D62] 10/15/2012  . Closed left hip fracture (HCC) [S72.002A] 10/13/2012  . Syncope and collapse [R55] 10/13/2012  . Diabetes (HCC) [E11.9] 10/13/2012  . Hypertension [I10] 10/13/2012      Plan Of Care/Follow-up recommendations:  Activity:  as tolerated Diet:  heart healthy diet  Is patient on multiple antipsychotic therapies at discharge:  No   Has Patient had three or more failed trials of antipsychotic monotherapy by history:  No  Recommended Plan for Multiple Antipsychotic Therapies: NA    Benjimen Kelley, PMH-NP 07/09/2015, 4:56 PM

## 2015-07-09 NOTE — Discharge Instructions (Addendum)
For your ongoing behavioral health needs, you are advised to follow up with an outpatient counselor.  An appointment has been scheduled for [facility] at [facility].  You will be seeing Judithe ModestSusan Bond, LCSW.  The appointment is scheduled for 2:00 pm, tomorrow, Tuesday, 07/10/2015.  Plan to be there 15 minutes early for your first visit.  Please note that [facility] has a 24 hour cancellation policy if you decide not to keep this appointment.  There is a $50 cancellation fee if you cancel without appropriate notice:       Judithe ModestSusan Bond, LCSW      [facility] [address]      [id number]      [place], [place] [address]      [phone]  Adjustment Disorder Adjustment disorder is an unusually severe reaction to a stressful life event, such as the loss of a job or physical illness. The event may be any stressful event other than the loss of a loved one. Adjustment disorder may affect your feelings, your thinking, how you act, or a combination of these. It may interfere with personal relationships or with the way you are at work, school, or home. People with this disorder are at risk for suicide and substance abuse. They may develop a more serious mental disorder, such as major depressive disorder or post-traumatic stress disorder. SIGNS AND SYMPTOMS  Symptoms may include:  Sadness, depressed mood, or crying spells.  Loss of enjoyment.  Change in appetite or weight.  Sense of loss or hopelessness.  Thoughts of suicide.  Anxiety, worry, or nervousness.  Trouble sleeping.  Avoiding family and friends.  Poor school performance.  Fighting or vandalism.  Reckless driving.  Skipping school.  Poor work International aid/development workerperformance.  Ignoring bills. Symptoms of adjustment disorder start within 3 months of the stressful life event. They do not last more than 6 months after the event has ended. DIAGNOSIS  To make a diagnosis, your health care provider will ask  about what has happened in your life and how it has affected you. He or she may also ask about your medical history and use of medicines, alcohol, and other substances. Your health care provider may do a physical exam and order lab tests or other studies. You may be referred to a mental health specialist for evaluation. TREATMENT  Treatment options include:  Counseling or talk therapy. Talk therapy is usually provided by mental health specialists.  Medicine. Certain medicines may help with depression, anxiety, and sleep.  Support groups. Support groups offer emotional support, advice, and guidance. They are made up of people who have had similar experiences. HOME CARE INSTRUCTIONS  Keep all follow-up visits as directed by your health care provider. This is important.  Take medicines only as directed by your health care provider. SEEK MEDICAL CARE IF:  Your symptoms get worse.  SEEK IMMEDIATE MEDICAL CARE IF: You have serious thoughts about hurting yourself or someone else. MAKE SURE YOU:  Understand these instructions.  Will watch your condition.  Will get help right away if you are not doing well or get worse.   This information is not intended to replace advice given to you by your health care provider. Make sure you discuss any questions you have with your health care provider.   Document Released: 04/01/2006 Document Revised: 08/18/2014 Document Reviewed: 12/20/2013 Elsevier Interactive Patient Education [employer]2016 Elsevier Inc.

## 2015-07-09 NOTE — ED Notes (Addendum)
Pt walked to and from restroom.

## 2015-07-09 NOTE — Consult Note (Signed)
[facility] Face-to-Face Psychiatry Consult   Reason for Consult:  Statement saying she was ready to die as she has lived her life Referring Physician:  EDP Patient Identification: Brandy Bailey MRN:  [id number] Principal Diagnosis: Adjustment disorder with disturbance of emotion Diagnosis:   Patient Active Problem List   Diagnosis Date Noted  . Adjustment disorder with disturbance of emotion [F43.29] 07/09/2015    Priority: High  . MDD (major depressive disorder), recurrent episode, moderate (HCC) [F33.1] 03/26/2015  . Protein-calorie malnutrition, severe (HCC) [E43] 12/16/2014  . Cellulitis of right foot [L03.115] 12/15/2014  . Cellulitis [L03.90] 12/15/2014  . Depression [F32.9] 03/10/2013  . Acute encephalopathy [G93.40] 02/21/2013  . History of CVA (cerebrovascular accident) [Z86.73] 02/21/2013  . Acute blood loss anemia [D62] 10/15/2012  . Closed left hip fracture (HCC) [S72.002A] 10/13/2012  . Syncope and collapse [R55] 10/13/2012  . Diabetes (HCC) [E11.9] 10/13/2012  . Hypertension [I10] 10/13/2012    Total Time spent with patient: 45 minutes  Subjective:   Brandy Bailey is a 81 y.o. female patient does not warrant admission.  HPI:  81 yo female sent to the ED after stating she "just going to lay down and die, already lived her life and now it is time to go."  Her family sent her to the ED.  No past psychiatric hospitalization, no prior suicide attempts.  Patient stated on assessment today that she "did not mean it" as she was frustrated at the time and had not been taking her Seroquel.  Denies suicidal/homicidal ideations, hallucinations, and alcohol/drug abuse.  Stable for discharge, she states she will be careful of what she says, "you better believe it!"  Past Psychiatric History: Depression  Risk to Self: Suicidal Ideation: No-Not Currently/Within Last 6 Months (Patient reports wanting to go to sleep, told Grand Daughter ) Suicidal Intent: No Is patient at risk for  suicide?: Yes Suicidal Plan?: No Access to Means: No [facility] Daughter unsure if there is a gun in the home.) What has been your use of drugs/alcohol within the last 12 months?: None How many times?: 0 Other Self Harm Risks: None Triggers for Past Attempts: Unknown Intentional Self Injurious Behavior: None Risk to Others: Homicidal Ideation: No Thoughts of Harm to Others: No Current Homicidal Intent: No Current Homicidal Plan: No Access to Homicidal Means: No Identified Victim: N/A History of harm to others?: No Assessment of Violence: None Noted Violent Behavior Description: N/A Does patient have access to weapons?: No (Grand Daughter not sure if gun in the home.) Criminal Charges Pending?: No Does patient have a court date: No Prior Inpatient Therapy: Prior Inpatient Therapy: No Prior Therapy Dates: N/A Prior Therapy Facilty/Provider(s): None Reason for Treatment: None Prior Outpatient Therapy: Prior Outpatient Therapy: No Prior Therapy Dates: N/A Prior Therapy Facilty/Provider(s): N/A Reason for Treatment: N/A Does patient have an ACCT team?: No Does patient have Intensive In-House Services?  : No Does patient have [facility] services? : No Does patient have P4CC services?: No  Past Medical History:  Past Medical History  Diagnosis Date  . Hypertension   . Osteoarthritis   . Hypercholesteremia   . Type II diabetes mellitus (HCC)   . Depression     Past Surgical History  Procedure Laterality Date  . Intramedullary (im) nail intertrochanteric Left 10/14/2012    Procedure: INTRAMEDULLARY (IM) NAIL INTERTROCHANTRIC;  Surgeon: Eulas Post, MD;  Location: [facility] OR;  Service: Orthopedics;  Laterality: Left;  . Cataract extraction w/ intraocular lens  implant, bilateral  1999-2004  Family History:  Family History  Problem Relation Age of Onset  . Family history unknown: Yes   Family Psychiatric  History: None Social History:  History  Alcohol Use No    Comment: Patient  denies      History  Drug Use No    Comment: Patient denies    Social History   Social History  . Marital Status: Widowed    Spouse Name: N/A  . Number of Children: N/A  . Years of Education: N/A   Social History Main Topics  . Smoking status: Never Smoker   . Smokeless tobacco: Never Used  . Alcohol Use: No     Comment: Patient denies   . Drug Use: No     Comment: Patient denies  . Sexual Activity: No   Other Topics Concern  . None   Social History Narrative   Additional Social History:                          Allergies:   Allergies  Allergen Reactions  . Celecoxib Hives    Reaction to Celebrex    Labs:  Results for orders placed or performed during the hospital encounter of 07/08/15 (from the past 48 hour(s))  Comprehensive metabolic panel     Status: Abnormal   Collection Time: 07/08/15  3:30 PM  Result Value Ref Range   Sodium 140 135 - 145 mmol/L   Potassium 4.5 3.5 - 5.1 mmol/L   Chloride 105 101 - 111 mmol/L   CO2 30 22 - 32 mmol/L   Glucose, Bld 115 (H) 65 - 99 mg/dL   BUN 19 6 - 20 mg/dL   Creatinine, Ser 0.67 0.44 - 1.00 mg/dL   Calcium 9.3 8.9 - 10.3 mg/dL   Total Protein 7.9 6.5 - 8.1 g/dL   Albumin 4.2 3.5 - 5.0 g/dL   AST 24 15 - 41 U/L   ALT 13 (L) 14 - 54 U/L   Alkaline Phosphatase 70 38 - 126 U/L   Total Bilirubin 0.7 0.3 - 1.2 mg/dL   GFR calc non Af Amer >60 >60 mL/min   GFR calc Af Amer >60 >60 mL/min    Comment: (NOTE) The eGFR has been calculated using the CKD EPI equation. This calculation has not been validated in all clinical situations. eGFR's persistently <60 mL/min signify possible Chronic Kidney Disease.    Anion gap 5 5 - 15  Ethanol (ETOH)     Status: None   Collection Time: 07/08/15  3:30 PM  Result Value Ref Range   Alcohol, Ethyl (B) <5 <5 mg/dL    Comment:        LOWEST DETECTABLE LIMIT FOR SERUM ALCOHOL IS 5 mg/dL FOR MEDICAL PURPOSES ONLY   Salicylate level     Status: None   Collection Time:  07/08/15  3:30 PM  Result Value Ref Range   Salicylate Lvl <0.8 2.8 - 30.0 mg/dL  Acetaminophen level     Status: Abnormal   Collection Time: 07/08/15  3:30 PM  Result Value Ref Range   Acetaminophen (Tylenol), Serum <10 (L) 10 - 30 ug/mL    Comment:        THERAPEUTIC CONCENTRATIONS VARY SIGNIFICANTLY. A RANGE OF 10-30 ug/mL MAY BE AN EFFECTIVE CONCENTRATION FOR MANY PATIENTS. HOWEVER, SOME ARE BEST TREATED AT CONCENTRATIONS OUTSIDE THIS RANGE. ACETAMINOPHEN CONCENTRATIONS >150 ug/mL AT 4 HOURS AFTER INGESTION AND >50 ug/mL AT 12 HOURS AFTER INGESTION ARE OFTEN ASSOCIATED  WITH TOXIC REACTIONS.   CBC     Status: Abnormal   Collection Time: 07/08/15  3:30 PM  Result Value Ref Range   WBC 4.3 4.0 - 10.5 K/uL   RBC 4.55 3.87 - 5.11 MIL/uL   Hemoglobin 12.4 12.0 - 15.0 g/dL   HCT 40.7 36.0 - 46.0 %   MCV 89.5 78.0 - 100.0 fL   MCH 27.3 26.0 - 34.0 pg   MCHC 30.5 30.0 - 36.0 g/dL   RDW 15.9 (H) 11.5 - 15.5 %   Platelets 168 150 - 400 K/uL  Urine rapid drug screen (hosp performed) (Not at [facility])     Status: None   Collection Time: 07/08/15  7:00 PM  Result Value Ref Range   Opiates NONE DETECTED NONE DETECTED   Cocaine NONE DETECTED NONE DETECTED   Benzodiazepines NONE DETECTED NONE DETECTED   Amphetamines NONE DETECTED NONE DETECTED   Tetrahydrocannabinol NONE DETECTED NONE DETECTED   Barbiturates NONE DETECTED NONE DETECTED    Comment:        DRUG SCREEN FOR MEDICAL PURPOSES ONLY.  IF CONFIRMATION IS NEEDED FOR ANY PURPOSE, NOTIFY LAB WITHIN 5 DAYS.        LOWEST DETECTABLE LIMITS FOR URINE DRUG SCREEN Drug Class       Cutoff (ng/mL) Amphetamine      1000 Barbiturate      200 Benzodiazepine   169 Tricyclics       678 Opiates          300 Cocaine          300 THC              50   Urinalysis, Routine w reflex microscopic (not at [facility])     Status: Abnormal   Collection Time: 07/08/15  7:00 PM  Result Value Ref Range   Color, Urine YELLOW YELLOW   APPearance  CLEAR CLEAR   Specific Gravity, Urine 1.020 1.005 - 1.030   pH 7.0 5.0 - 8.0   Glucose, UA NEGATIVE NEGATIVE mg/dL   Hgb urine dipstick NEGATIVE NEGATIVE   Bilirubin Urine NEGATIVE NEGATIVE   Ketones, ur NEGATIVE NEGATIVE mg/dL   Protein, ur 100 (A) NEGATIVE mg/dL   Nitrite NEGATIVE NEGATIVE   Leukocytes, UA NEGATIVE NEGATIVE  Urine microscopic-add on     Status: None   Collection Time: 07/08/15  7:00 PM  Result Value Ref Range   Squamous Epithelial / LPF NONE SEEN NONE SEEN    Comment: Please note change in reference range.   WBC, UA 0-5 0 - 5 WBC/hpf    Comment: Please note change in reference range.   RBC / HPF NONE SEEN 0 - 5 RBC/hpf    Comment: Please note change in reference range.   Bacteria, UA NONE SEEN NONE SEEN    Comment: Please note change in reference range.    Current Facility-Administered Medications  Medication Dose Route Frequency Provider Last Rate Last Dose  . calcium carbonate (OS-CAL - dosed in mg of elemental calcium) tablet 500 mg of elemental calcium  1 tablet Oral Daily Lacretia Leigh, MD   500 mg of elemental calcium at 07/09/15 1019   Current Outpatient Prescriptions  Medication Sig Dispense Refill  . acetaminophen (TYLENOL) 500 MG tablet Take 500-1,000 mg by mouth daily as needed for headache (pain).    . calcium carbonate (OS-CAL) 600 MG TABS tablet Take 600 mg by mouth daily.    . feeding supplement, ENSURE ENLIVE, (ENSURE ENLIVE) LIQD Take 237  mLs by mouth 2 (two) times daily between meals. 237 mL 12  . Ketotifen Fumarate (ITCHY EYE DROPS OP) Place 1 drop into the left eye daily as needed (itching).    . ondansetron (ZOFRAN ODT) 4 MG disintegrating tablet Take 1 tablet (4 mg total) by mouth every 8 (eight) hours as needed for nausea or vomiting. 12 tablet 0    Musculoskeletal: Strength & Muscle Tone: decreased Gait & Station: unsteady Patient leans: N/A  Psychiatric Specialty Exam: Review of Systems  Constitutional: Negative.   HENT:  Negative.   Eyes: Negative.   Respiratory: Negative.   Cardiovascular: Negative.   Gastrointestinal: Negative.   Genitourinary: Negative.   Musculoskeletal: Negative.   Skin: Negative.   Neurological: Negative.   Endo/Heme/Allergies: Negative.   Psychiatric/Behavioral:       Negative     Blood pressure 159/96, pulse 76, temperature 98.3 F (36.8 C), temperature source Oral, resp. rate 18, height 5' 5" (1.651 m), weight 50.803 kg (112 lb), SpO2 99 %.Body mass index is 18.64 kg/(m^2).  General Appearance: Casual  Eye Contact::  Good  Speech:  Normal Rate  Volume:  Normal  Mood:  Euthymic  Affect:  Congruent  Thought Process:  Coherent  Orientation:  Full (Time, Place, and Person)  Thought Content:  WDL  Suicidal Thoughts:  No  Homicidal Thoughts:  No  Memory:  Immediate;   Good Recent;   Good Remote;   Good  Judgement:  Fair  Insight:  Good  Psychomotor Activity:  Normal  Concentration:  Good  Recall:  Good  Fund of Knowledge:Good  Language: Good  Akathisia:  No  Handed:  Right  AIMS (if indicated):     Assets:  Housing Leisure Time Resilience Social Support  ADL's:  Intact  Cognition: WNL  Sleep:      Treatment Plan Summary: Daily contact with patient to assess and evaluate symptoms and progress in treatment, Medication management and Plan adjustment disorder with disturbance of emotions:  -Crisis stabilization -Individual counseling -Appointment [facility] at [facility] for counseling  Disposition: No evidence of imminent risk to self or others at present.    Waylan Boga, [place] 07/09/2015 4:47 PM Patient seen face-to-face for psychiatric evaluation, chart reviewed and case discussed with the physician extender and developed treatment plan. Reviewed the information documented and agree with the treatment plan. Corena Pilgrim, MD

## 2015-07-09 NOTE — ED Notes (Signed)
Spoke with Dr. Criss AlvineGoldston regarding pt's discharge. Pt has been cleared by psych and has also been medically cleared. Pt is ambulatory and insistent that she needs to leave. Primary RN has attempted to call family member multiple times with no response. Pt will be taking a cab home per her request.

## 2015-07-10 ENCOUNTER — Ambulatory Visit: Payer: 59 | Admitting: Licensed Clinical Social Worker

## 2015-07-11 LAB — URINE CULTURE

## 2015-07-16 ENCOUNTER — Ambulatory Visit: Payer: 59 | Admitting: Licensed Clinical Social Worker

## 2015-09-23 ENCOUNTER — Encounter (HOSPITAL_COMMUNITY): Payer: Self-pay | Admitting: Emergency Medicine

## 2015-09-23 ENCOUNTER — Emergency Department (HOSPITAL_COMMUNITY)
Admission: EM | Admit: 2015-09-23 | Discharge: 2015-09-23 | Disposition: A | Discharge status: Home / Self Care | Payer: Medicare Other | Attending: Emergency Medicine | Admitting: Emergency Medicine

## 2015-09-23 ENCOUNTER — Emergency Department (HOSPITAL_COMMUNITY): Payer: Medicare Other

## 2015-09-23 DIAGNOSIS — M6281 Muscle weakness (generalized): Secondary | ICD-10-CM | POA: Diagnosis present

## 2015-09-23 DIAGNOSIS — Z8659 Personal history of other mental and behavioral disorders: Secondary | ICD-10-CM | POA: Diagnosis not present

## 2015-09-23 DIAGNOSIS — E119 Type 2 diabetes mellitus without complications: Secondary | ICD-10-CM | POA: Insufficient documentation

## 2015-09-23 DIAGNOSIS — M1990 Unspecified osteoarthritis, unspecified site: Secondary | ICD-10-CM | POA: Diagnosis not present

## 2015-09-23 DIAGNOSIS — Z79899 Other long term (current) drug therapy: Secondary | ICD-10-CM | POA: Insufficient documentation

## 2015-09-23 DIAGNOSIS — I10 Essential (primary) hypertension: Secondary | ICD-10-CM | POA: Diagnosis not present

## 2015-09-23 DIAGNOSIS — R531 Weakness: Secondary | ICD-10-CM

## 2015-09-23 DIAGNOSIS — Z8673 Personal history of transient ischemic attack (TIA), and cerebral infarction without residual deficits: Secondary | ICD-10-CM | POA: Diagnosis not present

## 2015-09-23 DIAGNOSIS — F0390 Unspecified dementia without behavioral disturbance: Secondary | ICD-10-CM | POA: Insufficient documentation

## 2015-09-23 LAB — CBC WITH DIFFERENTIAL/PLATELET
Basophils Absolute: 0 10*3/uL (ref 0.0–0.1)
Basophils Relative: 1 %
EOS PCT: 1 %
Eosinophils Absolute: 0 10*3/uL (ref 0.0–0.7)
HEMATOCRIT: 39.7 % (ref 36.0–46.0)
Hemoglobin: 12.8 g/dL (ref 12.0–15.0)
Lymphocytes Relative: 21 %
Lymphs Abs: 0.8 10*3/uL (ref 0.7–4.0)
MCH: 28.8 pg (ref 26.0–34.0)
MCHC: 32.2 g/dL (ref 30.0–36.0)
MCV: 89.2 fL (ref 78.0–100.0)
MONOS PCT: 8 %
Monocytes Absolute: 0.3 10*3/uL (ref 0.1–1.0)
NEUTROS PCT: 69 %
Neutro Abs: 2.6 10*3/uL (ref 1.7–7.7)
PLATELETS: 171 10*3/uL (ref 150–400)
RBC: 4.45 MIL/uL (ref 3.87–5.11)
RDW: 14.8 % (ref 11.5–15.5)
WBC: 3.7 10*3/uL — AB (ref 4.0–10.5)

## 2015-09-23 LAB — COMPREHENSIVE METABOLIC PANEL
ALBUMIN: 3.6 g/dL (ref 3.5–5.0)
ALT: 11 U/L — AB (ref 14–54)
AST: 23 U/L (ref 15–41)
Alkaline Phosphatase: 64 U/L (ref 38–126)
Anion gap: 16 — ABNORMAL HIGH (ref 5–15)
BILIRUBIN TOTAL: 1.4 mg/dL — AB (ref 0.3–1.2)
BUN: 23 mg/dL — AB (ref 6–20)
CHLORIDE: 105 mmol/L (ref 101–111)
CO2: 22 mmol/L (ref 22–32)
CREATININE: 0.68 mg/dL (ref 0.44–1.00)
Calcium: 9.4 mg/dL (ref 8.9–10.3)
GFR calc Af Amer: >60 mL/min (ref >60)
GLUCOSE: 101 mg/dL — AB (ref 65–99)
Potassium: 4 mmol/L (ref 3.5–5.1)
Sodium: 143 mmol/L (ref 135–145)
Total Protein: 7 g/dL (ref 6.5–8.1)

## 2015-09-23 LAB — URINALYSIS, ROUTINE W REFLEX MICROSCOPIC
GLUCOSE, UA: NEGATIVE mg/dL
Hgb urine dipstick: NEGATIVE
KETONES UR: 15 mg/dL — AB
LEUKOCYTES UA: NEGATIVE
Nitrite: NEGATIVE
PH: 5 (ref 5.0–8.0)
Protein, ur: 100 mg/dL — AB
SPECIFIC GRAVITY, URINE: 1.026 (ref 1.005–1.030)

## 2015-09-23 LAB — URINE MICROSCOPIC-ADD ON

## 2015-09-23 LAB — I-STAT TROPONIN, ED: TROPONIN I, POC: 0 ng/mL (ref 0.00–0.08)

## 2015-09-23 NOTE — ED Provider Notes (Signed)
CSN: [id number]     Arrival date & time 09/23/15  1308 History   First MD Initiated Contact with Patient 09/23/15 1318     Chief Complaint  Patient presents with  . Weakness     (Consider location/radiation/quality/duration/timing/severity/associated sxs/prior Treatment) Patient is a 82 y.o. female presenting with general illness. The history is provided by the patient.  Illness Severity:  Moderate Onset quality:  Sudden Duration:  2 days Timing:  Constant Progression:  Unchanged Chronicity:  New Associated symptoms: no chest pain, no congestion, no fever, no headaches, no myalgias, no nausea, no rhinorrhea, no shortness of breath, no vomiting and no wheezing     82 yo F with a chief complaint of weakness. This was reported by the EMS personnel. To that she had some left arm weakness. Patient is unsure what exactly is wrong with her. States that she had a stroke in the past and has some left sided weakness but is not sure why she is here. Little 5 caveat dementia. Patient has a family members here in the hospital and states she is at her baseline.  Past Medical History  Diagnosis Date  . Hypertension   . Osteoarthritis   . Hypercholesteremia   . Type II diabetes mellitus (HCC)   . Depression    Past Surgical History  Procedure Laterality Date  . Intramedullary (im) nail intertrochanteric Left 10/14/2012    Procedure: INTRAMEDULLARY (IM) NAIL INTERTROCHANTRIC;  Surgeon: Eulas Post, MD;  Location: [facility] OR;  Service: Orthopedics;  Laterality: Left;  . Cataract extraction w/ intraocular lens  implant, bilateral  1999-2004   Family History  Problem Relation Age of Onset  . Family history unknown: Yes   Social History  Substance Use Topics  . Smoking status: Never Smoker   . Smokeless tobacco: Never Used  . Alcohol Use: No     Comment: Patient denies    OB History    No data available     Review of Systems  Constitutional: Negative for fever and chills.  HENT:  Negative for congestion and rhinorrhea.   Eyes: Negative for redness and visual disturbance.  Respiratory: Negative for shortness of breath and wheezing.   Cardiovascular: Negative for chest pain and palpitations.  Gastrointestinal: Negative for nausea and vomiting.  Genitourinary: Negative for dysuria and urgency.  Musculoskeletal: Negative for myalgias and arthralgias.  Skin: Negative for pallor and wound.  Neurological: Positive for weakness. Negative for dizziness and headaches.      Allergies  Celecoxib  Home Medications   Prior to Admission medications   Medication Sig Start Date End Date Taking? Authorizing Provider  acetaminophen (TYLENOL) 500 MG tablet Take 500-1,000 mg by mouth daily as needed for headache (pain).    Historical Provider, MD  calcium carbonate (OS-CAL) 600 MG TABS tablet Take 600 mg by mouth daily.    Historical Provider, MD  feeding supplement, ENSURE ENLIVE, (ENSURE ENLIVE) LIQD Take 237 mLs by mouth 2 (two) times daily between meals. 12/17/14   Joseph Art, DO  Ketotifen Fumarate (ITCHY EYE DROPS OP) Place 1 drop into the left eye daily as needed (itching).    Historical Provider, MD  ondansetron (ZOFRAN ODT) 4 MG disintegrating tablet Take 1 tablet (4 mg total) by mouth every 8 (eight) hours as needed for nausea or vomiting. 06/24/15   Alvira Monday, MD   BP 151/76 mmHg  Pulse 69  Temp(Src) 98.4 F (36.9 C) (Oral)  Resp 16  Ht  (1.676 m)  Wt  112 lb (50.803 kg)  BMI 18.09 kg/m2  SpO2 95% Physical Exam  Constitutional: She is oriented to person, place, and time. She appears well-developed and well-nourished. No distress.  HENT:  Head: Normocephalic and atraumatic.  Eyes: EOM are normal. Pupils are equal, round, and reactive to light.  Neck: Normal range of motion. Neck supple.  Cardiovascular: Normal rate and regular rhythm.  Exam reveals no gallop and no friction rub.   No murmur heard. Pulmonary/Chest: Effort normal. She has no wheezes.  She has no rales.  Abdominal: Soft. She exhibits no distension. There is no tenderness.  Musculoskeletal: She exhibits no edema or tenderness.  Neurological: She is alert and oriented to person, place, and time. No cranial nerve deficit or sensory deficit. Coordination and gait normal. GCS eye subscore is 4. GCS verbal subscore is 5. GCS motor subscore is 6. She displays no Babinski's sign on the right side. She displays no Babinski's sign on the left side.  Reflex Scores:      Tricep reflexes are 2+ on the right side and 2+ on the left side.      Bicep reflexes are 2+ on the right side and 2+ on the left side.      Brachioradialis reflexes are 2+ on the right side and 2+ on the left side.      Patellar reflexes are 2+ on the right side and 2+ on the left side.      Achilles reflexes are 2+ on the right side and 2+ on the left side. 4 out of 5 muscle strength to the left upper and left lower extremity compared to right. Pulse motor and sensation is intact. Patient is able to ambulate with a stuttering gait that she says is normal.  Skin: Skin is warm and dry. She is not diaphoretic.  Psychiatric: She has a normal mood and affect. Her behavior is normal.  Nursing note and vitals reviewed.   ED Course  Procedures (including critical care time) Labs Review Labs Reviewed - No data to display  Imaging Review No results found. I have personally reviewed and evaluated these images and lab results as part of my medical decision-making.   EKG Interpretation None      MDM   Final diagnoses:  None    82 yo F with a chief complaint weakness. Patient is unsure why she is here. Neuro exam is consistent with her old findings per the family. We'll obtain a chest x-ray urine EKG CBC and CMP.  Eval unremarkable, d/c home.   Melene Plan, DO 09/24/15 [phone]

## 2015-09-23 NOTE — Discharge Instructions (Signed)

## 2015-09-23 NOTE — ED Notes (Signed)
Pt arrived to [facility] via stretcher to wait for granddaughter to pick her up. Alert, pleasant, calm.

## 2015-09-23 NOTE — ED Notes (Signed)
PT unable to provide UA at this time, PT stated that she had not had anything to drink since last night.

## 2015-09-23 NOTE — ED Notes (Signed)
Pt here from home with c/o left arm weakness, pt sats were 87 to 88 % came up on 2 liters to 98 %

## 2015-10-11 ENCOUNTER — Emergency Department (HOSPITAL_COMMUNITY)
Admission: EM | Admit: 2015-10-11 | Discharge: 2015-10-12 | Disposition: A | Discharge status: Home / Self Care | Payer: Medicare Other | Attending: Emergency Medicine | Admitting: Emergency Medicine

## 2015-10-11 ENCOUNTER — Encounter (HOSPITAL_COMMUNITY): Payer: Self-pay | Admitting: Emergency Medicine

## 2015-10-11 DIAGNOSIS — F32A Depression, unspecified: Secondary | ICD-10-CM

## 2015-10-11 DIAGNOSIS — Z8739 Personal history of other diseases of the musculoskeletal system and connective tissue: Secondary | ICD-10-CM | POA: Insufficient documentation

## 2015-10-11 DIAGNOSIS — Z79899 Other long term (current) drug therapy: Secondary | ICD-10-CM | POA: Insufficient documentation

## 2015-10-11 DIAGNOSIS — E119 Type 2 diabetes mellitus without complications: Secondary | ICD-10-CM | POA: Diagnosis not present

## 2015-10-11 DIAGNOSIS — Z046 Encounter for general psychiatric examination, requested by authority: Secondary | ICD-10-CM

## 2015-10-11 DIAGNOSIS — I10 Essential (primary) hypertension: Secondary | ICD-10-CM | POA: Insufficient documentation

## 2015-10-11 DIAGNOSIS — F329 Major depressive disorder, single episode, unspecified: Secondary | ICD-10-CM | POA: Insufficient documentation

## 2015-10-11 DIAGNOSIS — R45851 Suicidal ideations: Secondary | ICD-10-CM | POA: Diagnosis not present

## 2015-10-11 DIAGNOSIS — F331 Major depressive disorder, recurrent, moderate: Secondary | ICD-10-CM | POA: Diagnosis present

## 2015-10-11 LAB — COMPREHENSIVE METABOLIC PANEL
ALK PHOS: 65 U/L (ref 38–126)
ALT: 18 U/L (ref 14–54)
AST: 22 U/L (ref 15–41)
Albumin: 3.6 g/dL (ref 3.5–5.0)
Anion gap: 8 (ref 5–15)
BILIRUBIN TOTAL: 0.5 mg/dL (ref 0.3–1.2)
BUN: 23 mg/dL — AB (ref 6–20)
CALCIUM: 9.1 mg/dL (ref 8.9–10.3)
CO2: 28 mmol/L (ref 22–32)
CREATININE: 0.79 mg/dL (ref 0.44–1.00)
Chloride: 104 mmol/L (ref 101–111)
GFR calc Af Amer: >60 mL/min (ref >60)
GLUCOSE: 100 mg/dL — AB (ref 65–99)
POTASSIUM: 4 mmol/L (ref 3.5–5.1)
Sodium: 140 mmol/L (ref 135–145)
TOTAL PROTEIN: 6.9 g/dL (ref 6.5–8.1)

## 2015-10-11 LAB — RAPID URINE DRUG SCREEN, HOSP PERFORMED
Amphetamines: NOT DETECTED
BENZODIAZEPINES: NOT DETECTED
Barbiturates: NOT DETECTED
Cocaine: NOT DETECTED
OPIATES: NOT DETECTED
Tetrahydrocannabinol: NOT DETECTED

## 2015-10-11 LAB — CBC WITH DIFFERENTIAL/PLATELET
BASOS ABS: 0 10*3/uL (ref 0.0–0.1)
Basophils Relative: 1 %
EOS ABS: 0 10*3/uL (ref 0.0–0.7)
EOS PCT: 1 %
HCT: 38.6 % (ref 36.0–46.0)
Hemoglobin: 11.9 g/dL — ABNORMAL LOW (ref 12.0–15.0)
Lymphocytes Relative: 23 %
Lymphs Abs: 0.8 10*3/uL (ref 0.7–4.0)
MCH: 28.1 pg (ref 26.0–34.0)
MCHC: 30.8 g/dL (ref 30.0–36.0)
MCV: 91.3 fL (ref 78.0–100.0)
MONO ABS: 0.4 10*3/uL (ref 0.1–1.0)
MONOS PCT: 13 %
Neutro Abs: 2.1 10*3/uL (ref 1.7–7.7)
Neutrophils Relative %: 62 %
PLATELETS: 142 10*3/uL — AB (ref 150–400)
RBC: 4.23 MIL/uL (ref 3.87–5.11)
RDW: 14.7 % (ref 11.5–15.5)
WBC: 3.4 10*3/uL — ABNORMAL LOW (ref 4.0–10.5)

## 2015-10-11 LAB — ETHANOL

## 2015-10-11 LAB — ACETAMINOPHEN LEVEL

## 2015-10-11 LAB — SALICYLATE LEVEL: Salicylate Lvl: <4 mg/dL (ref 2.8–30.0)

## 2015-10-11 MED ORDER — ACETAMINOPHEN 325 MG PO TABS: 650.0000 mg | ORAL_TABLET | ORAL | Status: DC | PRN

## 2015-10-11 NOTE — ED Notes (Signed)
ED PA at bedside

## 2015-10-11 NOTE — BH Assessment (Signed)
Assessment completed. Consulted Dr. Jama Flavors who recommended that pt be evaluated by the psychiatrist in the morning. Informed Melburn Hake, PA-C of the recommendation.

## 2015-10-11 NOTE — ED Provider Notes (Signed)
CSN: [id number]     Arrival date & time 10/11/15  1740 History   First MD Initiated Contact with Patient 10/11/15 1818     Chief Complaint  Patient presents with  . Depression     (Consider location/radiation/quality/duration/timing/severity/associated sxs/prior Treatment) HPI   Patient is a 37 [address] female past medical history of hypertension, diabetes and depression who presents the ED with complaint of worsening depression. Patient notes over the past year she has been more depressed but reports specifically she has been feeling worse over the past few weeks. Patient endorses having suicide ideation however she denies having any plan due to "if I have a plan or act on anything that would be considered a sin and I would not be able to be for forgiven for that". Patient notes she has been on medication for her depression in the past but states she stopped taking them one month ago. Patient reports "I am tired of living" and "I feel like I am just in the way". Denies HI. Denies visual or auditory hallucinations. Patient denies any pain or complaints at this time. Denies alcohol or drug use.  Past Medical History  Diagnosis Date  . Hypertension   . Osteoarthritis   . Hypercholesteremia   . Type II diabetes mellitus (HCC)   . Depression    Past Surgical History  Procedure Laterality Date  . Intramedullary (im) nail intertrochanteric Left 10/14/2012    Procedure: INTRAMEDULLARY (IM) NAIL INTERTROCHANTRIC;  Surgeon: Eulas Post, MD;  Location: [facility] OR;  Service: Orthopedics;  Laterality: Left;  . Cataract extraction w/ intraocular lens  implant, bilateral  1999-2004   Family History  Problem Relation Age of Onset  . Family history unknown: Yes   Social History  Substance Use Topics  . Smoking status: Never Smoker   . Smokeless tobacco: Never Used  . Alcohol Use: No     Comment: Patient denies    OB History    No data available     Review of Systems  Psychiatric/Behavioral:  Positive for suicidal ideas.       Depressed  All other systems reviewed and are negative.     Allergies  Celecoxib  Home Medications   Prior to Admission medications   Medication Sig Start Date End Date Taking? Authorizing Provider  acetaminophen (TYLENOL) 500 MG tablet Take 500-1,000 mg by mouth daily as needed for headache (pain).   Yes Historical Provider, MD  calcium carbonate (OS-CAL) 600 MG TABS tablet Take 600 mg by mouth daily.   Yes Historical Provider, MD  Ketotifen Fumarate (ITCHY EYE DROPS OP) Place 1 drop into the left eye daily as needed (itching).   Yes Historical Provider, MD  feeding supplement, ENSURE ENLIVE, (ENSURE ENLIVE) LIQD Take 237 mLs by mouth 2 (two) times daily between meals. Patient not taking: Reported on 10/11/2015 12/17/14   Selinda Orion Vann, DO  ondansetron (ZOFRAN ODT) 4 MG disintegrating tablet Take 1 tablet (4 mg total) by mouth every 8 (eight) hours as needed for nausea or vomiting. Patient not taking: Reported on 10/11/2015 06/24/15   Alvira Monday, MD   BP 168/78 mmHg  Pulse 63  Temp(Src) 98.7 F (37.1 C) (Oral)  Resp 17  SpO2 98% Physical Exam  Constitutional: She is oriented to person, place, and time. She appears well-developed and well-nourished.  HENT:  Head: Normocephalic and atraumatic.  Mouth/Throat: Oropharynx is clear and moist. No oropharyngeal exudate.  Eyes: Conjunctivae and EOM are normal. Right eye exhibits no discharge. Left  eye exhibits no discharge. No scleral icterus.  Neck: Normal range of motion. Neck supple.  Cardiovascular: Normal rate, regular rhythm, normal heart sounds and intact distal pulses.   Pulmonary/Chest: Effort normal and breath sounds normal. No respiratory distress. She has no wheezes. She has no rales. She exhibits no tenderness.  Abdominal: Soft. Bowel sounds are normal. She exhibits no distension and no mass. There is no tenderness. There is no rebound and no guarding.  Musculoskeletal: Normal range of  motion. She exhibits no edema.  Lymphadenopathy:    She has no cervical adenopathy.  Neurological: She is alert and oriented to person, place, and time.  Skin: Skin is warm and dry.  Psychiatric: Her speech is normal and behavior is normal. She exhibits a depressed mood. She expresses suicidal ideation. She expresses no suicidal plans.  Nursing note and vitals reviewed.   ED Course  Procedures (including critical care time) Labs Review Labs Reviewed  COMPREHENSIVE METABOLIC PANEL - Abnormal; Notable for the following:    Glucose, Bld 100 (*)    BUN 23 (*)    All other components within normal limits  CBC WITH DIFFERENTIAL/PLATELET - Abnormal; Notable for the following:    WBC 3.4 (*)    Hemoglobin 11.9 (*)    Platelets 142 (*)    All other components within normal limits  ACETAMINOPHEN LEVEL - Abnormal; Notable for the following:    Acetaminophen (Tylenol), Serum <10 (*)    All other components within normal limits  ETHANOL  SALICYLATE LEVEL  URINE RAPID DRUG SCREEN, HOSP PERFORMED    Imaging Review No results found. I have personally reviewed and evaluated these images and lab results as part of my medical decision-making.   EKG Interpretation None      MDM   Final diagnoses:  Suicidal ideation  Depression    Patient presents with worsening depression and suicidal ideation, denies plan. History of depression, denies being on any medications. VSS. Exam unremarkable. Labs consistent with patient's baseline values. Patient medically cleared. Consulted TTS. Behavioral Health recommends psych evaluation in the morning.    Satira Sark [place], [place] 10/11/15 2208  Doug Sou, MD 10/12/15 [phone]

## 2015-10-11 NOTE — ED Notes (Signed)
Bed: [facility] Expected date:  Expected time:  Means of arrival:  Comments: EMS- 82yo F, depression/"tired of living"

## 2015-10-11 NOTE — ED Notes (Signed)
TTS at bedside. 

## 2015-10-11 NOTE — ED Provider Notes (Signed)
Patient has vague thoughts of wishing to die. She stating that she would not commit suicide stating that "that would be a mortal sin." Does report that she gets no enjoyment out of life. Patient alert and afford Glasgow coma score 15.  Doug Sou, MD 10/11/15 2125

## 2015-10-11 NOTE — ED Notes (Addendum)
Per EMS: pt from home. Pt c/o depression x 1 year. Pt denies SI/HI. Pt states she wishes when she goes to bed that she will not wake up. Pt currently does not take any meds.

## 2015-10-11 NOTE — BH Assessment (Addendum)
Tele Assessment Note   Brandy Bailey is an 82 y.o. female presenting to [facility] due to increasing depression. It has been documented that pt stated "I wish I could go to bed and not wake up". Pt acknowledged making the statement and stated "I did not mean it". "That would be an unforgiveable sin".  Pt denies any previous suicide attempts or self-injurious behaviors. Pt did not report any current mental health treatment and inpatient hospitalizations. PT did not share any recent stressors but endorsed several depressive symptoms such as insomnia, tearfulness, fatigue, loss of interest in usual pleasure and feeling worthless. Pt denies HI and AVH at this time. Pt denied having access to weapons and did not report any pending criminal charges. Pt did not report any upcoming court dates. Pt denied any alcohol or illicit substance abuse. Pt denies physical, sexual and emotional abuse at this time.  Attempted to contact pt's daughter Brandy Bailey [phone]) for additional information; however there was no answer.   Diagnosis: Major Depressive Disorder, Recurrent,  Past Medical History:  Past Medical History  Diagnosis Date  . Hypertension   . Osteoarthritis   . Hypercholesteremia   . Type II diabetes mellitus (HCC)   . Depression     Past Surgical History  Procedure Laterality Date  . Intramedullary (im) nail intertrochanteric Left 10/14/2012    Procedure: INTRAMEDULLARY (IM) NAIL INTERTROCHANTRIC;  Surgeon: Eulas Post, MD;  Location: [facility] OR;  Service: Orthopedics;  Laterality: Left;  . Cataract extraction w/ intraocular lens  implant, bilateral  1999-2004    Family History:  Family History  Problem Relation Age of Onset  . Family history unknown: Yes    Social History:  reports that she has never smoked. She has never used smokeless tobacco. She reports that she does not drink alcohol or use illicit drugs.  Additional Social History:  Alcohol / Drug Use History of alcohol / drug  use?: No history of alcohol / drug abuse  CIWA: CIWA-Ar BP: 137/68 mmHg Pulse Rate: 88 COWS:    PATIENT STRENGTHS: (choose at least two) Active sense of humor Average or above average intelligence Communication skills General fund of knowledge Religious Affiliation  Allergies:  Allergies  Allergen Reactions  . Celecoxib Hives    Reaction to Celebrex    Home Medications:  (Not in a hospital admission)  OB/GYN Status:  No LMP recorded. Patient is postmenopausal.  General Assessment Data Location of Assessment: [facility] ED TTS Assessment: In system Is this a Tele or Face-to-Face Assessment?: Face-to-Face Is this an Initial Assessment or a Re-assessment for this encounter?: Initial Assessment Marital status: Widowed Is patient pregnant?: No Pregnancy Status: No Living Arrangements: Other relatives (Daughter ) Can pt return to current living arrangement?: Yes Admission Status: Voluntary Is patient capable of signing voluntary admission?: Yes Referral Source: Self/Family/Friend Insurance type: [facility]      Crisis Care Plan Living Arrangements: Other relatives (Daughter ) Name of Psychiatrist: No provider reported  Name of Therapist: No provider reported   Education Status Is patient currently in school?: No Current Grade: N/A Highest grade of school patient has completed: N/A Name of school: N/A Contact person: N/A  Risk to self with the past 6 months Suicidal Ideation: Yes-Currently Present Has patient been a risk to self within the past 6 months prior to admission? : No Suicidal Intent: No Has patient had any suicidal intent within the past 6 months prior to admission? : No Is patient at risk for suicide?: No Suicidal Plan?:  No Has patient had any suicidal plan within the past 6 months prior to admission? : No Access to Means: No What has been your use of drugs/alcohol within the last 12 months?: Pt denies use.  Previous Attempts/Gestures: No How many times?:  0 Other Self Harm Risks: Pt denies  Triggers for Past Attempts: None known (No previous attempts reported. ) Intentional Self Injurious Behavior: None Family Suicide History: No Recent stressful life event(s):  (PT denies ) Persecutory voices/beliefs?: No Depression: Yes Depression Symptoms: Insomnia, Tearfulness, Fatigue, Loss of interest in usual pleasures, Feeling worthless/self pity Substance abuse history and/or treatment for substance abuse?: No Suicide prevention information given to non-admitted patients: Not applicable  Risk to Others within the past 6 months Homicidal Ideation: No Does patient have any lifetime risk of violence toward others beyond the six months prior to admission? : No Thoughts of Harm to Others: No Current Homicidal Intent: No Current Homicidal Plan: No Access to Homicidal Means: No Identified Victim: N/A History of harm to others?: No Assessment of Violence: None Noted Violent Behavior Description: No violent  behaviors observed. Pt is calm and cooperative at this time.  Does patient have access to weapons?: No Criminal Charges Pending?: No Does patient have a court date: No Is patient on probation?: No  Psychosis Hallucinations: None noted Delusions: None noted  Mental Status Report Appearance/Hygiene: Unremarkable Eye Contact: Good Motor Activity: Unremarkable Speech: Logical/coherent, Soft Level of Consciousness: Quiet/awake Mood: Pleasant Affect: Appropriate to circumstance Anxiety Level: Minimal Thought Processes: Coherent, Relevant Judgement: Unimpaired Orientation: Appropriate for developmental age Obsessive Compulsive Thoughts/Behaviors: None  Cognitive Functioning Concentration: Normal Memory: Recent Intact, Remote Impaired IQ: Average Insight: Fair Impulse Control: Good Appetite: Good Weight Loss: 0 Weight Gain: 0 Sleep: No Change Total Hours of Sleep: 8 Vegetative Symptoms: None  ADLScreening [facility] Assessment  Services) Patient's cognitive ability adequate to safely complete daily activities?: Yes Patient able to express need for assistance with ADLs?: Yes Independently performs ADLs?: Yes (appropriate for developmental age)  Prior Inpatient Therapy Prior Inpatient Therapy: No  Prior Outpatient Therapy Prior Outpatient Therapy: No Does patient have an ACCT team?: No Does patient have Intensive In-House Services?  : No Does patient have [facility] services? : No Does patient have P4CC services?: No  ADL Screening (condition at time of admission) Patient's cognitive ability adequate to safely complete daily activities?: Yes Patient able to express need for assistance with ADLs?: Yes Independently performs ADLs?: Yes (appropriate for developmental age)       Abuse/Neglect Assessment (Assessment to be complete while patient is alone) Physical Abuse: Denies Verbal Abuse: Denies Sexual Abuse: Denies Exploitation of patient/patient's resources: Denies Self-Neglect: Denies     Merchant navy officer (For Healthcare) Does patient have an advance directive?: No Would patient like information on creating an advanced directive?: No - patient declined information    Additional Information 1:1 In Past 12 Months?: No CIRT Risk: No Elopement Risk: No Does patient have medical clearance?: Yes     Disposition:  Disposition Initial Assessment Completed for this Encounter: Yes Disposition of Patient: Other dispositions Other disposition(s): Other (Comment) (AM Psych eval )  Kriste Broman S 10/11/2015 8:42 PM

## 2015-10-12 ENCOUNTER — Emergency Department (HOSPITAL_COMMUNITY)
Admission: EM | Admit: 2015-10-12 | Discharge: 2015-10-13 | Disposition: A | Discharge status: Other Acute Inpatient Hospital | Payer: Medicare Other | Source: Home / Self Care | Attending: Emergency Medicine | Admitting: Emergency Medicine

## 2015-10-12 ENCOUNTER — Emergency Department (HOSPITAL_COMMUNITY): Payer: Medicare Other

## 2015-10-12 ENCOUNTER — Encounter (HOSPITAL_COMMUNITY): Payer: Self-pay | Admitting: Emergency Medicine

## 2015-10-12 DIAGNOSIS — F32A Depression, unspecified: Secondary | ICD-10-CM

## 2015-10-12 DIAGNOSIS — I10 Essential (primary) hypertension: Secondary | ICD-10-CM

## 2015-10-12 DIAGNOSIS — R45851 Suicidal ideations: Secondary | ICD-10-CM

## 2015-10-12 DIAGNOSIS — F331 Major depressive disorder, recurrent, moderate: Secondary | ICD-10-CM | POA: Diagnosis not present

## 2015-10-12 DIAGNOSIS — F329 Major depressive disorder, single episode, unspecified: Secondary | ICD-10-CM | POA: Insufficient documentation

## 2015-10-12 DIAGNOSIS — M1990 Unspecified osteoarthritis, unspecified site: Secondary | ICD-10-CM | POA: Insufficient documentation

## 2015-10-12 DIAGNOSIS — Z79899 Other long term (current) drug therapy: Secondary | ICD-10-CM | POA: Insufficient documentation

## 2015-10-12 DIAGNOSIS — E119 Type 2 diabetes mellitus without complications: Secondary | ICD-10-CM | POA: Insufficient documentation

## 2015-10-12 MED ORDER — TRAZODONE HCL 50 MG PO TABS: 50.0000 mg | ORAL_TABLET | Freq: Every evening | ORAL | Status: DC | PRN

## 2015-10-12 MED ORDER — CITALOPRAM HYDROBROMIDE 10 MG PO TABS
10.0000 mg | ORAL_TABLET | Freq: Every day | ORAL | Status: DC
  Administered 2015-10-12: 10 mg via ORAL
  Filled 2015-10-12 (×2): qty 1

## 2015-10-12 NOTE — Progress Notes (Signed)
CSW reached out to daughter. However, there was no answer.  Trish MageBrittney Vibha Bailey, LCSWA [phone] ED CSW 10/12/2015 4:45 PM

## 2015-10-12 NOTE — ED Notes (Addendum)
Pt woke up to have breakfast. She appears tired this am. Pt does contract for safety. Pt has a Comptrollersitter. Pt ate 100% of her breakfast and lunch. Pt refused top wash up. Daughter remains at the bedside. Pt does appears depressed evident by her remaining in the bed and sleeping. Sitter continues to monitor the pt. EKG and CXR completed. (2pm )Report to [place]. (3pm )

## 2015-10-12 NOTE — ED Provider Notes (Signed)
CSN: [id number]     Arrival date & time 10/12/15  2325 History   By signing my name below, I, Arlan OrganAshley Leger, attest that this documentation has been prepared under the direction and in the presence of Devoria AlbeIva Sohan Potvin, MD at 2359.  Electronically Signed: Arlan OrganAshley Leger, ED Scribe. 10/13/2015. 6:41 AM.   Chief Complaint  Patient presents with  . Medical Clearance    The history is provided by the patient. No language interpreter was used.    HPI Comments: Brandy Bailey is a 82 y.o. female with a PMHx of HTN, DM, and depression who presents to the Emergency Department here for medical clearance this evening. Pt was evaluated earlier this morning in the Emergency Department for depression by a psychiatrist who felt she should be admitted to a psychiatric hospital and was [facility]. Pt admits she did not wish to check in as she did not want to stay there. She refused to sign herself in so therefore patientt was sent back to the Emergency Department for re-evaluation. At this time, she denies any SI or HI. She states depression is still ongoing. However, pt admits she has been non-compliant with medications for some time now prior to coming to the ED. she does not remember a lot of her ER visit prior to this one.  PCP: Ginette OttoSTONEKING,HAL THOMAS, MD    Past Medical History  Diagnosis Date  . Hypertension   . Osteoarthritis   . Hypercholesteremia   . Type II diabetes mellitus (HCC)   . Depression    Past Surgical History  Procedure Laterality Date  . Intramedullary (im) nail intertrochanteric Left 10/14/2012    Procedure: INTRAMEDULLARY (IM) NAIL INTERTROCHANTRIC;  Surgeon: Eulas PostJoshua P Landau, MD;  Location: [facility] OR;  Service: Orthopedics;  Laterality: Left;  . Cataract extraction w/ intraocular lens  implant, bilateral  1999-2004   Family History  Problem Relation Age of Onset  . Family history unknown: Yes   Social History  Substance Use Topics  . Smoking status: Never Smoker    . Smokeless tobacco: Never Used  . Alcohol Use: No     Comment: Patient denies    Daughter lives with her  OB History    No data available     Review of Systems  Constitutional: Negative for fever and chills.  Respiratory: Negative for cough and shortness of breath.   Cardiovascular: Negative for chest pain.  Gastrointestinal: Negative for nausea, vomiting, abdominal pain and diarrhea.  Neurological: Negative for headaches.  Psychiatric/Behavioral: Positive for dysphoric mood.  All other systems reviewed and are negative.     Allergies  Celecoxib  Home Medications   Prior to Admission medications   Medication Sig Start Date End Date Taking? Authorizing Provider  acetaminophen (TYLENOL) 500 MG tablet Take 500-1,000 mg by mouth daily as needed for headache (pain).   Yes Historical Provider, MD  calcium carbonate (OS-CAL) 600 MG TABS tablet Take 600 mg by mouth daily.   Yes Historical Provider, MD  Ketotifen Fumarate (ITCHY EYE DROPS OP) Place 1 drop into the left eye daily as needed (itching).   Yes Historical Provider, MD  feeding supplement, ENSURE ENLIVE, (ENSURE ENLIVE) LIQD Take 237 mLs by mouth 2 (two) times daily between meals. Patient not taking: Reported on 10/11/2015 12/17/14   Selinda OrionJessica U Vann, DO  ondansetron (ZOFRAN ODT) 4 MG disintegrating tablet Take 1 tablet (4 mg total) by mouth every 8 (eight) hours as needed for nausea or vomiting. Patient not taking:  Reported on 10/11/2015 06/24/15   Alvira Monday, MD   Triage Vitals: BP 145/85 mmHg  Pulse 66  Temp(Src) 97.6 F (36.4 C) (Oral)  Resp 18  SpO2 98%  Vital signs normal     Physical Exam  Constitutional: She is oriented to person, place, and time. She appears well-developed and well-nourished.  Non-toxic appearance. She does not appear ill. No distress.  HENT:  Head: Normocephalic and atraumatic.  Right Ear: External ear normal.  Left Ear: External ear normal.  Nose: Nose normal. No mucosal edema or  rhinorrhea.  Mouth/Throat: Oropharynx is clear and moist and mucous membranes are normal. No dental abscesses or uvula swelling.  Eyes: Conjunctivae and EOM are normal. Pupils are equal, round, and reactive to light.  Neck: Normal range of motion and full passive range of motion without pain. Neck supple.  Cardiovascular: Normal rate, regular rhythm and normal heart sounds.  Exam reveals no gallop and no friction rub.   No murmur heard. Pulmonary/Chest: Effort normal and breath sounds normal. No respiratory distress. She has no wheezes. She has no rhonchi. She has no rales. She exhibits no tenderness and no crepitus.  Abdominal: Soft. Normal appearance and bowel sounds are normal. She exhibits no distension. There is no tenderness. There is no rebound and no guarding.  Musculoskeletal: Normal range of motion. She exhibits no edema or tenderness.  Moves all extremities well.   Neurological: She is alert and oriented to person, place, and time. She has normal strength. No cranial nerve deficit.  Skin: Skin is warm, dry and intact. No rash noted. No erythema. No pallor.  Psychiatric: She has a normal mood and affect. Her speech is normal and behavior is normal. Her mood appears not anxious.  Nursing note and vitals reviewed.   ED Course  Procedures (including critical care time)  Medications - No data to display   DIAGNOSTIC STUDIES: Oxygen Saturation is 98% on RA, Normal by my interpretation.    COORDINATION OF CARE: 12:02 AM-Discussed treatment plan with pt at bedside and pt agreed to plan.    0018 IVC papers have been filled out by me  04:00 [facility] is keeping her bed, but they want her to be transferred later this morning.    Labs Review Results for orders placed or performed during the hospital encounter of 10/11/15  Comprehensive metabolic panel  Result Value Ref Range   Sodium 140 135 - 145 mmol/L   Potassium 4.0 3.5 - 5.1 mmol/L   Chloride 104 101 - 111 mmol/L   CO2  28 22 - 32 mmol/L   Glucose, Bld 100 (H) 65 - 99 mg/dL   BUN 23 (H) 6 - 20 mg/dL   Creatinine, Ser 7.82 0.44 - 1.00 mg/dL   Calcium 9.1 8.9 - 95.6 mg/dL   Total Protein 6.9 6.5 - 8.1 g/dL   Albumin 3.6 3.5 - 5.0 g/dL   AST 22 15 - 41 U/L   ALT 18 14 - 54 U/L   Alkaline Phosphatase 65 38 - 126 U/L   Total Bilirubin 0.5 0.3 - 1.2 mg/dL   GFR calc non Af Amer >60 >60 mL/min   GFR calc Af Amer >60 >60 mL/min   Anion gap 8 5 - 15  Ethanol  Result Value Ref Range   Alcohol, Ethyl (B) <5 <5 mg/dL  CBC with Diff  Result Value Ref Range   WBC 3.4 (L) 4.0 - 10.5 K/uL   RBC 4.23 3.87 - 5.11 MIL/uL  Hemoglobin 11.9 (L) 12.0 - 15.0 g/dL   HCT 75.6 43.3 - 29.5 %   MCV 91.3 78.0 - 100.0 fL   MCH 28.1 26.0 - 34.0 pg   MCHC 30.8 30.0 - 36.0 g/dL   RDW 18.8 41.6 - 60.6 %   Platelets 142 (L) 150 - 400 K/uL   Neutrophils Relative % 62 %   Neutro Abs 2.1 1.7 - 7.7 K/uL   Lymphocytes Relative 23 %   Lymphs Abs 0.8 0.7 - 4.0 K/uL   Monocytes Relative 13 %   Monocytes Absolute 0.4 0.1 - 1.0 K/uL   Eosinophils Relative 1 %   Eosinophils Absolute 0.0 0.0 - 0.7 K/uL   Basophils Relative 1 %   Basophils Absolute 0.0 0.0 - 0.1 K/uL  Urine rapid drug screen (hosp performed)not at [facility]  Result Value Ref Range   Opiates NONE DETECTED NONE DETECTED   Cocaine NONE DETECTED NONE DETECTED   Benzodiazepines NONE DETECTED NONE DETECTED   Amphetamines NONE DETECTED NONE DETECTED   Tetrahydrocannabinol NONE DETECTED NONE DETECTED   Barbiturates NONE DETECTED NONE DETECTED  Salicylate level  Result Value Ref Range   Salicylate Lvl <4.0 2.8 - 30.0 mg/dL  Acetaminophen level  Result Value Ref Range   Acetaminophen (Tylenol), Serum <10 (L) 10 - 30 ug/mL    Laboratory interpretation all normal      Imaging Review Dg Chest 1 View  10/12/2015  CLINICAL DATA:  History of hypertension and diabetes. For psychiatric exam. EXAM: CHEST 1 VIEW COMPARISON:  09/23/2015 FINDINGS: Moderate hiatal hernia. Cardiac  silhouette normal in size and configuration. No mediastinal or hilar masses or evidence of adenopathy. Lungs are hyperexpanded. No evidence of pneumonia or edema. No pleural effusion or pneumothorax. Bony thorax is demineralized. There are chronic changes from prior right shoulder surgery. IMPRESSION: No acute cardiopulmonary disease. Electronically Signed   By: Amie Portland M.D.   On: 10/12/2015 12:51   I have personally reviewed and evaluated these images and lab results as part of my medical decision-making.    MDM   Final diagnoses:  Depression  Suicidal ideation   Disposition  Discharge to [facility] for inpatient psychiatric admission this morning.   I personally performed the services described in this documentation, which was scribed in my presence. The recorded information has been reviewed and considered.  Devoria Albe, MD, Concha Pyo, MD 10/13/15 [phone]

## 2015-10-12 NOTE — Progress Notes (Signed)
CSW received call from [facility], who have offered the patient a bed.  Accepting: Brandy PyoDr.Vanessa Ghram Report Number: [phone] Room:161 - B Tower/Geri Unit  CSW made nurse aware.  Trish MageBrittney Mirage Pfefferkorn, LCSWA [phone] ED CSW 10/12/2015 6:46 PM

## 2015-10-12 NOTE — Progress Notes (Addendum)
CSW [facility].   [facility] Only has two female beds [facility]- Informed to call back Rosalita Chessman(Suzanne was at lunch) [facility]- [facility]- May have a discharge, send referral [facility]- [facility], send referral [facility]- No availability [place]- Send referral, may have availability later today [facility]- [facility] availability [facility] [place]- Send referral, they have Sport and exercise psychologistavailability [facility]- Send referral, they have availability [place]- Send referral [facility]- CSW provided information to [place]. She stated she would call back once she checks availability.  CSW faxed referrals to: [facility] [place]  CSW received a voicemail from [place] and CSW returned the call [phone]. [facility] availability at this time.   2:30 pm- CSW received a telephone call from Brandy Bailey, Brandy Bailey. He stated patient has been accepted and the accepting physician is Dr. Mitzi DavenportShelby and Brandy FlakesVanessa Graham, Brandy Bailey. He stated the report number is [phone] and patient will have bed 161. CSW informed Brandy Bailey and Psychiatry Team.   CSW was informed by TTS to speak with patient regarding [facility]. CSW spoke with patient and she states she wants to go home and not to a facility. Patient states her daughter can take care of her at home. CSW called and spoke with EDP. EDP stated he will review patient and call back.    Brandy Bailey, LCSWA [phone] ED CSW 10/12/2015 1:06 PM

## 2015-10-12 NOTE — Consult Note (Signed)
[place] Psychiatry Consult   Reason for Consult:  Depression, suicidal thoughts Referring Physician:  EDP Patient Identification: Brandy Bailey MRN:  [id number] Principal Diagnosis: MDD (major depressive disorder), recurrent episode, moderate ([place]) Diagnosis:   Patient Active Problem List   Diagnosis Date Noted  . MDD (major depressive disorder), recurrent episode, moderate ([place]) [F33.1] 03/26/2015    Priority: High  . Adjustment disorder with disturbance of emotion [F43.29] 07/09/2015  . Protein-calorie malnutrition, severe ([place]) [E43] 12/16/2014  . Cellulitis of right foot [L03.115] 12/15/2014  . Cellulitis [L03.90] 12/15/2014  . Depression [F32.9] 03/10/2013  . Acute encephalopathy [G93.40] 02/21/2013  . History of CVA (cerebrovascular accident) [Z86.73] 02/21/2013  . Acute blood loss anemia [D62] 10/15/2012  . Closed left hip fracture ([place]) [S72.002A] 10/13/2012  . Syncope and collapse [R55] 10/13/2012  . Diabetes ([place]) [E11.9] 10/13/2012  . Hypertension [I10] 10/13/2012    Total Time spent with patient: 45 minutes  Subjective:   Brandy Bailey is a 82 y.o. female patient admitted with suicidal thoughts.  HPI: Patient is a 82 year old woman with history of depression treated with antidepressant over 20 years ago. Patient was interviewed in the presence of her daughter who is her care giver. Per her daughter, patient has been in and out of depression since her husband died many years ago. She presents with worsening depression characterized by hopelessness, daily fatigue, low energy level, lack of motivation, poor sleep and has been telling her daughter that she wishes she goes to bed and not wake up. Patient reports "I am tired of living" and "I feel like I am just in the way".  She denies HI, paranoia, visual or auditory hallucinations. She reports history of taking antidepressant but says she does not like taking medications. Patient denies history of alcohol and drug  abuse.  Past Psychiatric History: MDD  Risk to Self: Suicidal Ideation: Yes-Currently Present Suicidal Intent: No Is patient at risk for suicide?: No Suicidal Plan?: No Access to Means: No What has been your use of drugs/alcohol within the last 12 months?: Pt denies use.  How many times?: 0 Other Self Harm Risks: Pt denies  Triggers for Past Attempts: None known (No previous attempts reported. ) Intentional Self Injurious Behavior: None Risk to Others: Homicidal Ideation: No Thoughts of Harm to Others: No Current Homicidal Intent: No Current Homicidal Plan: No Access to Homicidal Means: No Identified Victim: N/A History of harm to others?: No Assessment of Violence: None Noted Violent Behavior Description: No violent  behaviors observed. Pt is calm and cooperative at this time.  Does patient have access to weapons?: No Criminal Charges Pending?: No Does patient have a court date: No Prior Inpatient Therapy: Prior Inpatient Therapy: No Prior Outpatient Therapy: Prior Outpatient Therapy: No Does patient have an ACCT team?: No Does patient have Intensive In-House Services?  : No Does patient have [facility] services? : No Does patient have P4CC services?: No  Past Medical History:  Past Medical History  Diagnosis Date  . Hypertension   . Osteoarthritis   . Hypercholesteremia   . Type II diabetes mellitus ([place])   . Depression     Past Surgical History  Procedure Laterality Date  . Intramedullary (im) nail intertrochanteric Left 10/14/2012    Procedure: INTRAMEDULLARY (IM) NAIL INTERTROCHANTRIC;  Surgeon: Johnny Bridge, MD;  Location: [place];  Service: Orthopedics;  Laterality: Left;  . Cataract extraction w/ intraocular lens  implant, bilateral  1999-2004   Family History:  Family History  Problem Relation Age of Onset  . Family history unknown: Yes   Family Psychiatric  History:  Social History:  History  Alcohol Use No    Comment: Patient denies      History   Drug Use No    Comment: Patient denies    Social History   Social History  . Marital Status: Widowed    Spouse Name: N/A  . Number of Children: N/A  . Years of Education: N/A   Social History Main Topics  . Smoking status: Never Smoker   . Smokeless tobacco: Never Used  . Alcohol Use: No     Comment: Patient denies   . Drug Use: No     Comment: Patient denies  . Sexual Activity: No   Other Topics Concern  . None   Social History Narrative   Additional Social History:    Allergies:   Allergies  Allergen Reactions  . Celecoxib Hives    Reaction to Celebrex    Labs:  Results for orders placed or performed during the hospital encounter of 10/11/15 (from the past 48 hour(s))  Comprehensive metabolic panel     Status: Abnormal   Collection Time: 10/11/15  7:18 PM  Result Value Ref Range   Sodium 140 135 - 145 mmol/L   Potassium 4.0 3.5 - 5.1 mmol/L   Chloride 104 101 - 111 mmol/L   CO2 28 22 - 32 mmol/L   Glucose, Bld 100 (H) 65 - 99 mg/dL   BUN 23 (H) 6 - 20 mg/dL   Creatinine, Ser 0.79 0.44 - 1.00 mg/dL   Calcium 9.1 8.9 - 10.3 mg/dL   Total Protein 6.9 6.5 - 8.1 g/dL   Albumin 3.6 3.5 - 5.0 g/dL   AST 22 15 - 41 U/L   ALT 18 14 - 54 U/L   Alkaline Phosphatase 65 38 - 126 U/L   Total Bilirubin 0.5 0.3 - 1.2 mg/dL   GFR calc non Af Amer >60 >60 mL/min   GFR calc Af Amer >60 >60 mL/min    Comment: (NOTE) The eGFR has been calculated using the CKD EPI equation. This calculation has not been validated in all clinical situations. eGFR's persistently <60 mL/min signify possible Chronic Kidney Disease.    Anion gap 8 5 - 15  Ethanol     Status: None   Collection Time: 10/11/15  7:18 PM  Result Value Ref Range   Alcohol, Ethyl (B) <5 <5 mg/dL    Comment:        LOWEST DETECTABLE LIMIT FOR SERUM ALCOHOL IS 5 mg/dL FOR MEDICAL PURPOSES ONLY   CBC with Diff     Status: Abnormal   Collection Time: 10/11/15  7:18 PM  Result Value Ref Range   WBC 3.4 (L) 4.0  - 10.5 K/uL   RBC 4.23 3.87 - 5.11 MIL/uL   Hemoglobin 11.9 (L) 12.0 - 15.0 g/dL   HCT 38.6 36.0 - 46.0 %   MCV 91.3 78.0 - 100.0 fL   MCH 28.1 26.0 - 34.0 pg   MCHC 30.8 30.0 - 36.0 g/dL   RDW 14.7 11.5 - 15.5 %   Platelets 142 (L) 150 - 400 K/uL   Neutrophils Relative % 62 %   Neutro Abs 2.1 1.7 - 7.7 K/uL   Lymphocytes Relative 23 %   Lymphs Abs 0.8 0.7 - 4.0 K/uL   Monocytes Relative 13 %   Monocytes Absolute 0.4 0.1 - 1.0 K/uL   Eosinophils Relative 1 %  Eosinophils Absolute 0.0 0.0 - 0.7 K/uL   Basophils Relative 1 %   Basophils Absolute 0.0 0.0 - 0.1 K/uL  Salicylate level     Status: None   Collection Time: 10/11/15  7:18 PM  Result Value Ref Range   Salicylate Lvl <1.0 2.8 - 30.0 mg/dL  Acetaminophen level     Status: Abnormal   Collection Time: 10/11/15  7:18 PM  Result Value Ref Range   Acetaminophen (Tylenol), Serum <10 (L) 10 - 30 ug/mL    Comment:        THERAPEUTIC CONCENTRATIONS VARY SIGNIFICANTLY. A RANGE OF 10-30 ug/mL MAY BE AN EFFECTIVE CONCENTRATION FOR MANY PATIENTS. HOWEVER, SOME ARE BEST TREATED AT CONCENTRATIONS OUTSIDE THIS RANGE. ACETAMINOPHEN CONCENTRATIONS >150 ug/mL AT 4 HOURS AFTER INGESTION AND >50 ug/mL AT 12 HOURS AFTER INGESTION ARE OFTEN ASSOCIATED WITH TOXIC REACTIONS.   Urine rapid drug screen (hosp performed)not at [facility]     Status: None   Collection Time: 10/11/15 10:24 PM  Result Value Ref Range   Opiates NONE DETECTED NONE DETECTED   Cocaine NONE DETECTED NONE DETECTED   Benzodiazepines NONE DETECTED NONE DETECTED   Amphetamines NONE DETECTED NONE DETECTED   Tetrahydrocannabinol NONE DETECTED NONE DETECTED   Barbiturates NONE DETECTED NONE DETECTED    Comment:        DRUG SCREEN FOR MEDICAL PURPOSES ONLY.  IF CONFIRMATION IS NEEDED FOR ANY PURPOSE, NOTIFY LAB WITHIN 5 DAYS.        LOWEST DETECTABLE LIMITS FOR URINE DRUG SCREEN Drug Class       Cutoff (ng/mL) Amphetamine      1000 Barbiturate       200 Benzodiazepine   315 Tricyclics       945 Opiates          300 Cocaine          300 THC              50     Current Facility-Administered Medications  Medication Dose Route Frequency Provider Last Rate Last Dose  . acetaminophen (TYLENOL) tablet 650 mg  650 mg Oral Q4H PRN Nona Dell, PA-C      . citalopram (CELEXA) tablet 10 mg  10 mg Oral Daily Kahlea Cobert, MD      . traZODone (DESYREL) tablet 50 mg  50 mg Oral QHS PRN Corena Pilgrim, MD       Current Outpatient Prescriptions  Medication Sig Dispense Refill  . acetaminophen (TYLENOL) 500 MG tablet Take 500-1,000 mg by mouth daily as needed for headache (pain).    . calcium carbonate (OS-CAL) 600 MG TABS tablet Take 600 mg by mouth daily.    Marland Kitchen Ketotifen Fumarate (ITCHY EYE DROPS OP) Place 1 drop into the left eye daily as needed (itching).    . feeding supplement, ENSURE ENLIVE, (ENSURE ENLIVE) LIQD Take 237 mLs by mouth 2 (two) times daily between meals. (Patient not taking: Reported on 10/11/2015) 237 mL 12  . ondansetron (ZOFRAN ODT) 4 MG disintegrating tablet Take 1 tablet (4 mg total) by mouth every 8 (eight) hours as needed for nausea or vomiting. (Patient not taking: Reported on 10/11/2015) 12 tablet 0    Musculoskeletal: Strength & Muscle Tone: within normal limits Gait & Station: unsteady Patient leans: Front  Psychiatric Specialty Exam: Review of Systems  Constitutional: Negative.   HENT: Negative.   Eyes: Negative.   Respiratory: Negative.   Cardiovascular: Negative.   Gastrointestinal: Negative.   Genitourinary: Negative.   Musculoskeletal:  Positive for myalgias.  Skin: Negative.   Endo/Heme/Allergies: Negative.   Psychiatric/Behavioral: Positive for depression and suicidal ideas. The patient is nervous/anxious and has insomnia.     Blood pressure 131/70, pulse 61, temperature 97.9 F (36.6 C), temperature source Oral, resp. rate 18, SpO2 98 %.There is no weight on file to calculate BMI.   General Appearance: Casual  Eye Contact::  Good  Speech:  Clear and Coherent  Volume:  Decreased  Mood:  Depressed, Dysphoric and Hopeless  Affect:  Constricted  Thought Process:  Goal Directed  Orientation:  Full (Time, Place, and Person)  Thought Content:  Negative  Suicidal Thoughts:  Yes.  without intent/plan  Homicidal Thoughts:  No  Memory:  Immediate;   Good Recent;   Fair Remote;   Fair  Judgement:  Fair  Insight:  Shallow  Psychomotor Activity:  Decreased  Concentration:  Good  Recall:  [place] of Knowledge:Fair  Language: Good  Akathisia:  No  Handed:  Right  AIMS (if indicated):     Assets:  Communication Skills Desire for Improvement  ADL's:  Impaired  Cognition: Impaired,  Mild  Sleep:   poor   Treatment Plan Summary: Daily contact with patient to assess and evaluate symptoms and progress in treatment and Medication management  -Celexa 33m daily for depression. -Trazodone 543mqhs prn for sleep.  Disposition: Recommend psychiatric Inpatient admission when medically cleared. Supportive therapy provided about ongoing stressors. Geropsych inpatient placement  AkCorena PilgrimMD 10/12/2015 11:14 AM

## 2015-10-12 NOTE — ED Notes (Signed)
[facility] called for transport. 

## 2015-10-12 NOTE — ED Notes (Signed)
Attempted to call daughter for pt as requested, phone rang busy x 3 attempts.

## 2015-10-12 NOTE — Progress Notes (Signed)
CSW was notified by TTS disposition that [facility] has offered patient a bed for inpatient treatment.  CSW met with patient and informed her that [facility] has offered her a bed for inpatient treatment. CSW educated patient about signing in voluntary and being involuntarily committed. Patient expressed understanding. Patient states that she would like to present to [facility].  CSW made EDP/Floyd aware.   CSW called [facility] to get a bed number, report number, and accepting physician. However, there was no answer. CSW left a message asking for a call back.  Patient request that CSW call her daughter to inform her of bed offer.  Willette Brace [phone] ED CSW 10/12/2015 4:44 PM

## 2015-10-12 NOTE — ED Notes (Signed)
Patient had [facility] accepted [facility]. Patient was voluntary. In[facility] that patient refused to sign voluntary so [facility].

## 2015-10-13 NOTE — ED Notes (Signed)
G[facility] called, states that Dr. Gwynneth MunsonExum is no longer accepting the pt d/t pt is not verbalizing SI or no attempts of suicide.  Olivia Mackieressa CSW/TTS and [facility] NP notified.

## 2015-10-13 NOTE — ED Notes (Signed)
Called [facility] to update that IVC paperwork will be taken out on patient. Charge nurse informed this Clinical research associatewriter bed will be held for patient.

## 2015-10-13 NOTE — Discharge Instructions (Signed)
°  Return without fail for worsening symptoms, including thoughts of wanting to hurt yourself, confusion or altered mental status, or any other symptoms concerning to you. Please follow-up with outpatient resources provided.   Helping Someone Who is Suicidal Suicide is when someone takes his or her own life. Someone who is thinking about suicide needs immediate help. Although you might not know what to say or do to help, start by letting that person know you care. Listen to him or her. Then talk about how to get help. Help is available through therapy, medicine, and other treatments. WHAT ARE SIGNS THAT SOMEONE IS SUICIDAL? Common signs include:   Signs of depression, such as:  Rage.  Irritability.  Shame.  Excessive worry.  Loss of interest in things the person once enjoyed.  Changes in social behaviors and relationships, including:  Isolating oneself.  Withdrawing from friends and family.  Giving away possessions.  Saying good-bye.  Acting aggressively.  Sleeping more or less than usual.  Having trouble managing school or work.   Talking about feeling hopeless or being a burden.  Engaging in risky behaviors, such as drinking more alcohol or using more drugs. WHAT ARE THE RISK FACTORS FOR SUICIDE? Risk factors for suicide include:   Other suicides in the family.  A history of suicide attempts.  Depression or other mental health issues.  Being in jail or facing jail time.  Having had close friends who have committed suicide.  Alcohol or drug abuse, especially combined with a mental illness.  WHAT SHOULD I DO IF SOMEONE IS SUICIDAL? If you believe a person is in immediate danger of committing suicide, call your local emergency services (911 in the [place] for help. If a person says he or she wants to commit suicide, take the threat seriously. Help the person get help right away by:   Calling your local emergency services.  Calling a suicide prevention  hotline.  Contacting a crisis center or a local suicide prevention center. These are often located at hospitals, clinics, [employer] service organizations, social service providers, or health departments. If a person confides in you that he or she is considering suicide:   Listen to the person's thoughts and concerns with compassion.  Let the person know you will stay with him or her.   Ask if the person is having thoughts of hurting himself or herself.   Offer to help the person get to a doctor or mental health professional.   Remove all weapons and medicines from the person's living space.  Do not promise to keep his or her thoughts of suicide a secret.   This information is not intended to replace advice given to you by your health care provider. Make sure you discuss any questions you have with your health care provider.   Document Released: 02/01/2003 Document Revised: 08/18/2014 Document Reviewed: 01/05/2014 Elsevier Interactive Patient Education [employer]2016 Elsevier Inc.

## 2015-10-13 NOTE — ED Notes (Addendum)
[facility].  States no one contacted him regarding transport for pt.  He states that he will try to get a unit to transport pt today.  Shuvon NP made aware

## 2015-10-13 NOTE — ED Notes (Signed)
Pt resting comfortably with sitter at bedside.  Denies any complaints at this time.

## 2015-10-13 NOTE — ED Provider Notes (Signed)
Please see previous physicians note regarding patient's presenting history and physical, initial ED course, and associated medical decision making. In short this is an 82 year old female with history of depression who returns [facility] with [facility]. [facility], but patient states that she did not want to go there because it was too far from her home. Subsequently sent back to ED for IVC paperwork, but patient not requiring mandatory IVC paperwork as she was not felt harmful to self or others. Spoke patient and her daughter who she lives with at home. They state that she has been having increasing depression with occasional passive thoughts of suicide. No plan. Currently no suicidal ideation. They're requesting discharge home with outpatient management regarding her depression. I do not feel that patient requires mandatory inpatient admission she is not felt to be a harm to herself or others. Her daughter also feels comfortable caring for her at home, acknowledging that they will return if any increasing thoughts of suicide or plan or worsening depression. She'll be provided outpatient psychiatric resources. IVC rescinded. Strict return and follow-up instructions have been reviewed. They express understanding of all discharge instructions and felt comfortable with the plan of care.  Lavera Guiseana Duo Liu, MD 10/13/15 [phone]

## 2015-10-13 NOTE — ED Notes (Signed)
Sheriff dept contacted to get an update on transport.  Report received was pt was [facility] [phone] [facility]  Left a msg.

## 2015-10-13 NOTE — ED Notes (Signed)
Belongings given to pt

## 2015-10-13 NOTE — ED Notes (Signed)
Spoke to pt's daughter, she's made aware that pt is no longer being transported to [facility].  She states that pt has been asking when is she going home, that she does not want to go home.  She also reports that pt does not recall verbalizing SI.  Daughter verbalizes to this nurse that she is fine with taking pt home today and that she  feels comfortable that pt would not harm herself.  Shuvon NP made aware.  She is still waiting for a return call from Dr. Margreta JourneyJ.  Liu EDP also made aware re this situation and has agreed to come see the pt and possibly discharge pt with daughter and with out pt resources.

## 2015-10-13 NOTE — ED Notes (Signed)
SGT Marina Goodellerry called to get info on pt.  He states that there will be a unit coming to transport pt

## 2015-10-15 ENCOUNTER — Emergency Department (HOSPITAL_COMMUNITY): Payer: Medicare Other

## 2015-10-15 ENCOUNTER — Encounter (HOSPITAL_COMMUNITY): Payer: Self-pay

## 2015-10-15 ENCOUNTER — Emergency Department (HOSPITAL_COMMUNITY)
Admission: EM | Admit: 2015-10-15 | Discharge: 2015-10-16 | Disposition: A | Discharge status: Home / Self Care | Payer: Medicare Other | Attending: Emergency Medicine | Admitting: Emergency Medicine

## 2015-10-15 DIAGNOSIS — I10 Essential (primary) hypertension: Secondary | ICD-10-CM | POA: Insufficient documentation

## 2015-10-15 DIAGNOSIS — Z79899 Other long term (current) drug therapy: Secondary | ICD-10-CM | POA: Diagnosis not present

## 2015-10-15 DIAGNOSIS — Z8639 Personal history of other endocrine, nutritional and metabolic disease: Secondary | ICD-10-CM | POA: Diagnosis not present

## 2015-10-15 DIAGNOSIS — W19XXXA Unspecified fall, initial encounter: Secondary | ICD-10-CM

## 2015-10-15 DIAGNOSIS — S0990XA Unspecified injury of head, initial encounter: Secondary | ICD-10-CM | POA: Diagnosis present

## 2015-10-15 DIAGNOSIS — Y998 Other external cause status: Secondary | ICD-10-CM | POA: Insufficient documentation

## 2015-10-15 DIAGNOSIS — R4020 Unspecified coma: Secondary | ICD-10-CM

## 2015-10-15 DIAGNOSIS — Y9289 Other specified places as the place of occurrence of the external cause: Secondary | ICD-10-CM | POA: Diagnosis not present

## 2015-10-15 DIAGNOSIS — Y9389 Activity, other specified: Secondary | ICD-10-CM | POA: Insufficient documentation

## 2015-10-15 DIAGNOSIS — S0101XA Laceration without foreign body of scalp, initial encounter: Secondary | ICD-10-CM | POA: Insufficient documentation

## 2015-10-15 DIAGNOSIS — S060X9A Concussion with loss of consciousness of unspecified duration, initial encounter: Secondary | ICD-10-CM | POA: Insufficient documentation

## 2015-10-15 DIAGNOSIS — W1839XA Other fall on same level, initial encounter: Secondary | ICD-10-CM | POA: Diagnosis not present

## 2015-10-15 DIAGNOSIS — Z8659 Personal history of other mental and behavioral disorders: Secondary | ICD-10-CM | POA: Diagnosis not present

## 2015-10-15 DIAGNOSIS — E119 Type 2 diabetes mellitus without complications: Secondary | ICD-10-CM | POA: Diagnosis not present

## 2015-10-15 LAB — BASIC METABOLIC PANEL
Anion gap: 9 (ref 5–15)
BUN: 15 mg/dL (ref 6–20)
CALCIUM: 9.6 mg/dL (ref 8.9–10.3)
CHLORIDE: 106 mmol/L (ref 101–111)
CO2: 29 mmol/L (ref 22–32)
CREATININE: 0.67 mg/dL (ref 0.44–1.00)
GFR calc non Af Amer: >60 mL/min (ref >60)
GLUCOSE: 97 mg/dL (ref 65–99)
Potassium: 4.9 mmol/L (ref 3.5–5.1)
Sodium: 144 mmol/L (ref 135–145)

## 2015-10-15 LAB — I-STAT TROPONIN, ED: TROPONIN I, POC: 0 ng/mL (ref 0.00–0.08)

## 2015-10-15 LAB — CBC
HCT: 38.9 % (ref 36.0–46.0)
Hemoglobin: 12.4 g/dL (ref 12.0–15.0)
MCH: 28.8 pg (ref 26.0–34.0)
MCHC: 31.9 g/dL (ref 30.0–36.0)
MCV: 90.3 fL (ref 78.0–100.0)
PLATELETS: 162 10*3/uL (ref 150–400)
RBC: 4.31 MIL/uL (ref 3.87–5.11)
RDW: 14.7 % (ref 11.5–15.5)
WBC: 3.9 10*3/uL — ABNORMAL LOW (ref 4.0–10.5)
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
## 2015-10-15 NOTE — ED Notes (Signed)
Attempted to call pt's home/daughter again without any answer

## 2015-10-15 NOTE — ED Notes (Signed)
MD at bedside. 

## 2015-10-15 NOTE — ED Provider Notes (Signed)
CSN: [id number]     Arrival date & time 10/15/15  1853 History   First MD Initiated Contact with Patient 10/15/15 1914     Chief Complaint  Patient presents with  . Fall  . Loss of Consciousness   HPI  Brandy Bailey is a 82 y.o. female PMH significant for hypertension, hypercholesterolemia, diabetes, depression, osteoarthritis presenting status post witnessed fall. Per RN note, family of patient states that they witnessed the fall and told EMS that patient struck her head on a door and passed out. Brandy Bailey endorses a left occipital headache where she struck her head. She denies chest pain, shortness of breath, abdominal pain, nausea, vomiting, pain elsewhere, loss of bowel or bladder control.  Past Medical History  Diagnosis Date  . Hypertension   . Osteoarthritis   . Hypercholesteremia   . Type II diabetes mellitus (HCC)   . Depression    Past Surgical History  Procedure Laterality Date  . Intramedullary (im) nail intertrochanteric Left 10/14/2012    Procedure: INTRAMEDULLARY (IM) NAIL INTERTROCHANTRIC;  Surgeon: Eulas Post, MD;  Location: [facility] OR;  Service: Orthopedics;  Laterality: Left;  . Cataract extraction w/ intraocular lens  implant, bilateral  1999-2004   Family History  Problem Relation Age of Onset  . Family history unknown: Yes   Social History  Substance Use Topics  . Smoking status: Never Smoker   . Smokeless tobacco: Never Used  . Alcohol Use: No     Comment: Patient denies    OB History    No data available     Review of Systems  Ten systems are reviewed and are negative for acute change except as noted in the HPI  Allergies  Celecoxib  Home Medications   Prior to Admission medications   Medication Sig Start Date End Date Taking? Authorizing Provider  calcium carbonate (OS-CAL) 600 MG TABS tablet Take 600 mg by mouth daily.    Historical Provider, MD  Ketotifen Fumarate (ITCHY EYE DROPS OP) Place 1 drop into the left eye daily as needed  (itching).    Historical Provider, MD   BP 171/88 mmHg  Pulse 71  Temp(Src) 98.2 F (36.8 C) (Oral)  Resp 16  Ht  (1.651 m)  Wt 48.535 kg  BMI 17.81 kg/m2  SpO2 100% Physical Exam  Constitutional: She is oriented to person, place, and time. She appears well-developed and well-nourished. No distress.  Frail, elderly appearing  HENT:  Head: Normocephalic.  Right Ear: External ear normal.  Left Ear: External ear normal.  Nose: Nose normal.  Mouth/Throat: Oropharynx is clear and moist. No oropharyngeal exudate.  Hematoma with overlying dried blood at left parietoccipital region of head.  Eyes: Conjunctivae are normal. Pupils are equal, round, and reactive to light. Right eye exhibits no discharge. Left eye exhibits no discharge. No scleral icterus.  Neck: No tracheal deviation present.  C-collar present.   Cardiovascular: Normal rate, regular rhythm, normal heart sounds and intact distal pulses.  Exam reveals no gallop and no friction rub.   No murmur heard. Pulmonary/Chest: Effort normal and breath sounds normal. No respiratory distress. She has no wheezes. She has no rales. She exhibits no tenderness.  Abdominal: Soft. Bowel sounds are normal. She exhibits no distension and no mass. There is no tenderness. There is no rebound and no guarding.  Musculoskeletal: Normal range of motion. She exhibits no edema or tenderness.  Cervical, thoracic, lumbar spine without midline tenderness, step-off or deformity. Neurovascularly intact bilaterally.  Lymphadenopathy:  She has no cervical adenopathy.  Neurological: She is alert and oriented to person, place, and time. No cranial nerve deficit. Coordination normal.  Skin: Skin is warm and dry. No rash noted. She is not diaphoretic. No erythema.  Psychiatric: She has a normal mood and affect. Her behavior is normal.  Nursing note and vitals reviewed.   ED Course  Procedures  Labs Review Labs Reviewed - No data to display  Imaging  Review No results found. I have personally reviewed and evaluated these images and lab results as part of my medical decision-making.   EKG Interpretation None      MDM   Final diagnoses:  None   Patient nontoxic appearing, VSS. Will perform broad workup for infectious vs neuro vs cardiac etiologies.  Care handoff to [facility], PA-C at shift change. If workup is negative, patient may be safely discharged with PCP follow-up within one week.   Melton KrebsSamantha Nicole Lysbeth Dicola, PA-C 10/18/15 [id number]  Rolan BuccoMelanie Belfi, MD 10/22/15 [phone]

## 2015-10-15 NOTE — ED Notes (Signed)
Pt returned from CT and picked up to go to xray

## 2015-10-15 NOTE — ED Provider Notes (Signed)
11:05 PM Patient care assumed from [facility] LidgerwoodRiley, PA-C at change of shift. Patient pending labs and imaging after a fall. Laboratory workup is consistent with priors. No evidence of acute intracranial or cervical changes. Wound to scalp cleaned revealing a 3 cm laceration. This was closed with 2 staples. Patient tolerated the procedure well. No indication for further emergent workup. Patient stable for discharge.  LACERATION REPAIR Performed by: Antony MaduraHUMES, Jaymarie Yeakel Authorized by: Antony MaduraHUMES, Sherrey North Consent: Verbal consent obtained. Risks and benefits: risks, benefits and alternatives were discussed Consent given by: patient Patient identity confirmed: provided demographic data Prepped and Draped in normal sterile fashion Wound explored  Laceration Location: L posterior parietal  Laceration Length: 3cm  No Foreign Bodies seen or palpated  Anesthesia: none  Local anesthetic: none  Anesthetic total: n/a  Irrigation method: syringe Amount of cleaning: standard  Skin closure: staples  Number of sutures: 2  Technique: simple  Patient tolerance: Patient tolerated the procedure well with no immediate complications.   Antony MaduraKelly Lucio Litsey, PA-C 10/15/15 [id number]  Rolan BuccoMelanie Belfi, MD 10/16/15 [phone]

## 2015-10-15 NOTE — Discharge Instructions (Signed)
[name],  Nice meeting you! Please follow-up with your primary care provider. Return to the emergency department if you develop loss of consciousness, chest pain, shortness of breath, abdominal pain, lose control of your bladder/bowel, become unable to walk. Feel better soon!  S. Lane Hacker, PA-C    Laceration Care, Adult A laceration is a cut that goes through all of the layers of the skin and into the tissue that is right under the skin. Some lacerations heal on their own. Others need to be closed with stitches (sutures), staples, skin adhesive strips, or skin glue. Proper laceration care minimizes the risk of infection and helps the laceration to heal better. HOW TO CARE FOR YOUR LACERATION If sutures or staples were used:  Keep the wound clean and dry.  If you were given a bandage (dressing), you should change it at least one time per day or as told by your health care provider. You should also change it if it becomes wet or dirty.  Keep the wound completely dry for the first 24 hours or as told by your health care provider. After that time, you may shower or bathe. However, make sure that the wound is not soaked in water until after the sutures or staples have been removed.  Clean the wound one time each day or as told by your health care provider:  Wash the wound with soap and water.  Rinse the wound with water to remove all soap.  Pat the wound dry with a clean towel. Do not rub the wound.  After cleaning the wound, apply a thin layer of antibiotic ointmentas told by your health care provider. This will help to prevent infection and keep the dressing from sticking to the wound.  Have the sutures or staples removed as told by your health care provider. If skin adhesive strips were used:  Keep the wound clean and dry.  If you were given a bandage (dressing), you should change it at least one time per day or as told by your health care provider. You should also  change it if it becomes dirty or wet.  Do not get the skin adhesive strips wet. You may shower or bathe, but be careful to keep the wound dry.  If the wound gets wet, pat it dry with a clean towel. Do not rub the wound.  Skin adhesive strips fall off on their own. You may trim the strips as the wound heals. Do not remove skin adhesive strips that are still stuck to the wound. They will fall off in time. If skin glue was used:  Try to keep the wound dry, but you may briefly wet it in the shower or bath. Do not soak the wound in water, such as by swimming.  After you have showered or bathed, gently pat the wound dry with a clean towel. Do not rub the wound.  Do not do any activities that will make you sweat heavily until the skin glue has fallen off on its own.  Do not apply liquid, cream, or ointment medicine to the wound while the skin glue is in place. Using those may loosen the film before the wound has healed.  If you were given a bandage (dressing), you should change it at least one time per day or as told by your health care provider. You should also change it if it becomes dirty or wet.  If a dressing is placed over the wound, be careful not to  apply tape directly over the skin glue. Doing that may cause the glue to be pulled off before the wound has healed.  Do not pick at the glue. The skin glue usually remains in place for 5-10 days, then it falls off of the skin. General Instructions  Take over-the-counter and prescription medicines only as told by your health care provider.  If you were prescribed an antibiotic medicine or ointment, take or apply it as told by your doctor. Do not stop using it even if your condition improves.  To help prevent scarring, make sure to cover your wound with sunscreen whenever you are outside after stitches are removed, after adhesive strips are removed, or when glue remains in place and the wound is healed. Make sure to wear a sunscreen of at least  30 SPF.  Do not scratch or pick at the wound.  Keep all follow-up visits as told by your health care provider. This is important.  Check your wound every day for signs of infection. Watch for:  Redness, swelling, or pain.  Fluid, blood, or pus.  Raise (elevate) the injured area above the level of your heart while you are sitting or lying down, if possible. SEEK MEDICAL CARE IF:  You received a tetanus shot and you have swelling, severe pain, redness, or bleeding at the injection site.  You have a fever.  A wound that was closed breaks open.  You notice a bad smell coming from your wound or your dressing.  You notice something coming out of the wound, such as wood or glass.  Your pain is not controlled with medicine.  You have increased redness, swelling, or pain at the site of your wound.  You have fluid, blood, or pus coming from your wound.  You notice a change in the color of your skin near your wound.  You need to change the dressing frequently due to fluid, blood, or pus draining from the wound.  You develop a new rash.  You develop numbness around the wound. SEEK IMMEDIATE MEDICAL CARE IF:  You develop severe swelling around the wound.  Your pain suddenly increases and is severe.  You develop painful lumps near the wound or on skin that is anywhere on your body.  You have a red streak going away from your wound.  The wound is on your hand or foot and you cannot properly move a finger or toe.  The wound is on your hand or foot and you notice that your fingers or toes look pale or bluish.   This information is not intended to replace advice given to you by your health care provider. Make sure you discuss any questions you have with your health care provider.   Document Released: 07/28/2005 Document Revised: 12/12/2014 Document Reviewed: 07/24/2014 Elsevier Interactive Patient Education 2016 [employer]. Fall Prevention in the Home  Falls can cause  injuries. They can happen to people of all ages. There are many things you can do to make your home safe and to help prevent falls.  WHAT CAN I DO ON THE OUTSIDE OF MY HOME?  Regularly fix the edges of walkways and driveways and fix any cracks.  Remove anything that might make you trip as you walk through a door, such as a raised step or threshold.  Trim any bushes or trees on the path to your home.  Use bright outdoor lighting.  Clear any walking paths of anything that might make someone trip, such as rocks or tools.  Regularly check to see if handrails are loose or broken. Make sure that both sides of any steps have handrails.  Any raised decks and porches should have guardrails on the edges.  Have any leaves, snow, or ice cleared regularly.  Use sand or salt on walking paths during winter.  Clean up any spills in your garage right away. This includes oil or grease spills. WHAT CAN I DO IN THE BATHROOM?   Use night lights.  Install grab bars by the toilet and in the tub and shower. Do not use towel bars as grab bars.  Use non-skid mats or decals in the tub or shower.  If you need to sit down in the shower, use a plastic, non-slip stool.  Keep the floor dry. Clean up any water that spills on the floor as soon as it happens.  Remove soap buildup in the tub or shower regularly.  Attach bath mats securely with double-sided non-slip rug tape.  Do not have throw rugs and other things on the floor that can make you trip. WHAT CAN I DO IN THE BEDROOM?  Use night lights.  Make sure that you have a light by your bed that is easy to reach.  Do not use any sheets or blankets that are too big for your bed. They should not hang down onto the floor.  Have a firm chair that has side arms. You can use this for support while you get dressed.  Do not have throw rugs and other things on the floor that can make you trip. WHAT CAN I DO IN THE KITCHEN?  Clean up any spills right  away.  Avoid walking on wet floors.  Keep items that you use a lot in easy-to-reach places.  If you need to reach something above you, use a strong step stool that has a grab bar.  Keep electrical cords out of the way.  Do not use floor polish or wax that makes floors slippery. If you must use wax, use non-skid floor wax.  Do not have throw rugs and other things on the floor that can make you trip. WHAT CAN I DO WITH MY STAIRS?  Do not leave any items on the stairs.  Make sure that there are handrails on both sides of the stairs and use them. Fix handrails that are broken or loose. Make sure that handrails are as long as the stairways.  Check any carpeting to make sure that it is firmly attached to the stairs. Fix any carpet that is loose or worn.  Avoid having throw rugs at the top or bottom of the stairs. If you do have throw rugs, attach them to the floor with carpet tape.  Make sure that you have a light switch at the top of the stairs and the bottom of the stairs. If you do not have them, ask someone to add them for you. WHAT ELSE CAN I DO TO HELP PREVENT FALLS?  Wear shoes that:  Do not have high heels.  Have rubber bottoms.  Are comfortable and fit you well.  Are closed at the toe. Do not wear sandals.  If you use a stepladder:  Make sure that it is fully opened. Do not climb a closed stepladder.  Make sure that both sides of the stepladder are locked into place.  Ask someone to hold it for you, if possible.  Clearly mark and make sure that you can see:  Any grab bars or handrails.  First and  last steps.  Where the edge of each step is.  Use tools that help you move around (mobility aids) if they are needed. These include:  Canes.  Walkers.  Scooters.  Crutches.  Turn on the lights when you go into a dark area. Replace any light bulbs as soon as they burn out.  Set up your furniture so you have a clear path. Avoid moving your furniture  around.  If any of your floors are uneven, fix them.  If there are any pets around you, be aware of where they are.  Review your medicines with your doctor. Some medicines can make you feel dizzy. This can increase your chance of falling. Ask your doctor what other things that you can do to help prevent falls.   This information is not intended to replace advice given to you by your health care provider. Make sure you discuss any questions you have with your health care provider.   Document Released: 05/24/2009 Document Revised: 12/12/2014 Document Reviewed: 09/01/2014 Elsevier Interactive Patient Education [employer]2016 Elsevier Inc.

## 2015-10-15 NOTE — ED Notes (Signed)
Pt's daughter left, called every number available in demographics and did not get an answer from anyone.

## 2015-10-15 NOTE — ED Notes (Signed)
EMT at bedside completing wound care

## 2015-10-15 NOTE — ED Notes (Signed)
Attempted all phone numbers in chart again, and left voicemails

## 2015-10-15 NOTE — ED Notes (Signed)
Awaiting confirmation of transportaition

## 2015-10-16 ENCOUNTER — Emergency Department (HOSPITAL_COMMUNITY)
Admission: EM | Admit: 2015-10-16 | Discharge: 2015-10-17 | Disposition: A | Discharge status: Home / Self Care | Payer: Medicare Other | Attending: Emergency Medicine | Admitting: Emergency Medicine

## 2015-10-16 ENCOUNTER — Emergency Department (HOSPITAL_COMMUNITY): Payer: Medicare Other

## 2015-10-16 ENCOUNTER — Encounter (HOSPITAL_COMMUNITY): Payer: Self-pay | Admitting: Emergency Medicine

## 2015-10-16 DIAGNOSIS — S0101XA Laceration without foreign body of scalp, initial encounter: Secondary | ICD-10-CM | POA: Diagnosis not present

## 2015-10-16 DIAGNOSIS — Y9389 Activity, other specified: Secondary | ICD-10-CM | POA: Diagnosis not present

## 2015-10-16 DIAGNOSIS — E119 Type 2 diabetes mellitus without complications: Secondary | ICD-10-CM | POA: Diagnosis not present

## 2015-10-16 DIAGNOSIS — Y998 Other external cause status: Secondary | ICD-10-CM | POA: Insufficient documentation

## 2015-10-16 DIAGNOSIS — Z7982 Long term (current) use of aspirin: Secondary | ICD-10-CM | POA: Diagnosis not present

## 2015-10-16 DIAGNOSIS — S61402A Unspecified open wound of left hand, initial encounter: Secondary | ICD-10-CM | POA: Insufficient documentation

## 2015-10-16 DIAGNOSIS — Z8659 Personal history of other mental and behavioral disorders: Secondary | ICD-10-CM | POA: Diagnosis not present

## 2015-10-16 DIAGNOSIS — I10 Essential (primary) hypertension: Secondary | ICD-10-CM | POA: Insufficient documentation

## 2015-10-16 DIAGNOSIS — S61412A Laceration without foreign body of left hand, initial encounter: Secondary | ICD-10-CM

## 2015-10-16 DIAGNOSIS — T7491XA Unspecified adult maltreatment, confirmed, initial encounter: Secondary | ICD-10-CM

## 2015-10-16 DIAGNOSIS — M1990 Unspecified osteoarthritis, unspecified site: Secondary | ICD-10-CM | POA: Insufficient documentation

## 2015-10-16 DIAGNOSIS — T7411XA Adult physical abuse, confirmed, initial encounter: Secondary | ICD-10-CM | POA: Insufficient documentation

## 2015-10-16 DIAGNOSIS — Y9289 Other specified places as the place of occurrence of the external cause: Secondary | ICD-10-CM | POA: Diagnosis not present

## 2015-10-16 DIAGNOSIS — S60512A Abrasion of left hand, initial encounter: Secondary | ICD-10-CM | POA: Insufficient documentation

## 2015-10-16 DIAGNOSIS — T148XXA Other injury of unspecified body region, initial encounter: Secondary | ICD-10-CM

## 2015-10-16 DIAGNOSIS — S6992XA Unspecified injury of left wrist, hand and finger(s), initial encounter: Secondary | ICD-10-CM | POA: Diagnosis present

## 2015-10-16 NOTE — ED Notes (Signed)
Bed: [id number] Expected date:  Expected time:  Means of arrival:  Comments: 82 yo F  Fall, facial hematoma
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
## 2015-10-16 NOTE — ED Notes (Signed)
Family called back and stated that they would find arrangements for picking up the patient.
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
## 2015-10-17 NOTE — Progress Notes (Signed)
ED CM consulted by ED SWs x 2 about possible home health service needs Pt informed CM she did not want home health services Pt states she walks and drives Pt was counting quarters and offered CM some She asked if she was being d/c home CM told her the ED RN was attempting to assist her to get home Pt voiced appreciation.  Very pleasant ED NT in room getting pt vital signs
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
## 2015-10-17 NOTE — ED Notes (Signed)
Per Social work patient should not be discharged until daughter has been reached and adult protective service report has been Building services engineerfiled.

## 2016-01-19 ENCOUNTER — Encounter (HOSPITAL_COMMUNITY): Payer: Self-pay | Admitting: *Deleted

## 2016-01-19 ENCOUNTER — Emergency Department (HOSPITAL_COMMUNITY)
Admission: EM | Admit: 2016-01-19 | Discharge: 2016-01-19 | Disposition: A | Discharge status: Home / Self Care | Payer: Medicare Other | Attending: Emergency Medicine | Admitting: Emergency Medicine

## 2016-01-19 DIAGNOSIS — M1990 Unspecified osteoarthritis, unspecified site: Secondary | ICD-10-CM | POA: Insufficient documentation

## 2016-01-19 DIAGNOSIS — R410 Disorientation, unspecified: Secondary | ICD-10-CM | POA: Insufficient documentation

## 2016-01-19 DIAGNOSIS — I10 Essential (primary) hypertension: Secondary | ICD-10-CM | POA: Diagnosis not present

## 2016-01-19 DIAGNOSIS — R4182 Altered mental status, unspecified: Secondary | ICD-10-CM | POA: Diagnosis present

## 2016-01-19 DIAGNOSIS — E7800 Pure hypercholesterolemia, unspecified: Secondary | ICD-10-CM | POA: Insufficient documentation

## 2016-01-19 DIAGNOSIS — Z7982 Long term (current) use of aspirin: Secondary | ICD-10-CM | POA: Insufficient documentation

## 2016-01-19 DIAGNOSIS — F32A Depression, unspecified: Secondary | ICD-10-CM

## 2016-01-19 DIAGNOSIS — F329 Major depressive disorder, single episode, unspecified: Secondary | ICD-10-CM | POA: Insufficient documentation

## 2016-01-19 DIAGNOSIS — E119 Type 2 diabetes mellitus without complications: Secondary | ICD-10-CM | POA: Diagnosis not present

## 2016-01-19 LAB — CBC
HCT: 37.2 % (ref 36.0–46.0)
Hemoglobin: 11.4 g/dL — ABNORMAL LOW (ref 12.0–15.0)
MCH: 26.9 pg (ref 26.0–34.0)
MCHC: 30.6 g/dL (ref 30.0–36.0)
MCV: 87.7 fL (ref 78.0–100.0)
Platelets: 226 10*3/uL (ref 150–400)
RBC: 4.24 MIL/uL (ref 3.87–5.11)
RDW: 14.6 % (ref 11.5–15.5)
WBC: 3.1 10*3/uL — ABNORMAL LOW (ref 4.0–10.5)

## 2016-01-19 LAB — URINALYSIS, ROUTINE W REFLEX MICROSCOPIC
Bilirubin Urine: NEGATIVE
Glucose, UA: NEGATIVE mg/dL
Hgb urine dipstick: NEGATIVE
Ketones, ur: NEGATIVE mg/dL
Nitrite: NEGATIVE
Protein, ur: 30 mg/dL — AB
Specific Gravity, Urine: 1.024 (ref 1.005–1.030)
pH: 5.5 (ref 5.0–8.0)

## 2016-01-19 LAB — TROPONIN I: Troponin I: <0.03 ng/mL (ref <0.031)

## 2016-01-19 LAB — COMPREHENSIVE METABOLIC PANEL
ALT: 13 U/L — ABNORMAL LOW (ref 14–54)
AST: 20 U/L (ref 15–41)
Albumin: 2.9 g/dL — ABNORMAL LOW (ref 3.5–5.0)
Alkaline Phosphatase: 58 U/L (ref 38–126)
Anion gap: 5 (ref 5–15)
BUN: 25 mg/dL — ABNORMAL HIGH (ref 6–20)
CO2: 30 mmol/L (ref 22–32)
Calcium: 9.5 mg/dL (ref 8.9–10.3)
Chloride: 106 mmol/L (ref 101–111)
Creatinine, Ser: 0.64 mg/dL (ref 0.44–1.00)
GFR calc Af Amer: >60 mL/min (ref >60)
GFR calc non Af Amer: >60 mL/min (ref >60)
Glucose, Bld: 114 mg/dL — ABNORMAL HIGH (ref 65–99)
Potassium: 4.2 mmol/L (ref 3.5–5.1)
Sodium: 141 mmol/L (ref 135–145)
Total Bilirubin: 0.6 mg/dL (ref 0.3–1.2)
Total Protein: 6.6 g/dL (ref 6.5–8.1)

## 2016-01-19 LAB — URINE MICROSCOPIC-ADD ON: RBC / HPF: NONE SEEN RBC/hpf (ref 0–5)

## 2016-01-19 NOTE — Discharge Instructions (Signed)
Confusion Confusion is the inability to think with your usual speed or clarity. Confusion may come on quickly or slowly over time. How quickly the confusion comes on depends on the cause. Confusion can be due to any number of causes. CAUSES   Concussion, head injury, or head trauma.  Seizures.  Stroke.  Fever.  Brain tumor.  Age related decreased brain function (dementia).  Heightened emotional states like rage or terror.  Mental illness in which the person loses the ability to determine what is real and what is not (hallucinations).  Infections such as a urinary tract infection (UTI).  Toxic effects from alcohol, drugs, or prescription medicines.  Dehydration and an imbalance of salts in the body (electrolytes).  Lack of sleep.  Low blood sugar (diabetes).  Low levels of oxygen from conditions such as chronic lung disorders.  Drug interactions or other medicine side effects.  Nutritional deficiencies, especially niacin, thiamine, vitamin C, or vitamin B.  Sudden drop in body temperature (hypothermia).  Change in routine, such as when traveling or hospitalized. SIGNS AND SYMPTOMS  People often describe their thinking as cloudy or unclear when they are confused. Confusion can also include feeling disoriented. That means you are unaware of where or who you are. You may also not know what the date or time is. If confused, you may also have difficulty paying attention, remembering, and making decisions. Some people also act aggressively when they are confused.  DIAGNOSIS  The medical evaluation of confusion may include:  Blood and urine tests.  X-rays.  Brain and nervous system tests.  Analyzing your brain waves (electroencephalogram or EEG).  Magnetic resonance imaging (MRI) of your head.  Computed tomography (CT) scan of your head.  Mental status tests in which your health care provider may ask many questions. Some of these questions may seem silly or strange,  but they are a very important test to help diagnose and treat confusion. TREATMENT  An admission to the hospital may not be needed, but a person with confusion should not be left alone. Stay with a family member or friend until the confusion clears. Avoid alcohol, pain relievers, or sedative drugs until you have fully recovered. Do not drive until directed by your health care provider. HOME CARE INSTRUCTIONS  What family and friends can do:  To find out if someone is confused, ask the person to state his or her name, age, and the date. If the person is unsure or answers incorrectly, he or she is confused.  Always introduce yourself, no matter how well the person knows you.  Often remind the person of his or her location.  Place a calendar and clock near the confused person.  Help the person with his or her medicines. You may want to use a pill box, an alarm as a reminder, or give the person each dose as prescribed.  Talk about current events and plans for the day.  Try to keep the environment calm, quiet, and peaceful.  Make sure the person keeps follow-up visits with his or her health care provider. PREVENTION  Ways to prevent confusion:  Avoid alcohol.  Eat a balanced diet.  Get enough sleep.  Take medicine only as directed by your health care provider.  Do not become isolated. Spend time with other people and make plans for your days.  Keep careful watch on your blood sugar levels if you are diabetic. SEEK IMMEDIATE MEDICAL CARE IF:   You develop severe headaches, repeated vomiting, seizures, blackouts, or   slurred speech.  There is increasing confusion, weakness, numbness, restlessness, or personality changes.  You develop a loss of balance, have marked dizziness, feel uncoordinated, or fall.  You have delusions, hallucinations, or develop severe anxiety.  Your family members think you need to be rechecked.   This information is not intended to replace advice given  to you by your health care provider. Make sure you discuss any questions you have with your health care provider.   Document Released: 09/04/2004 Document Revised: 08/18/2014 Document Reviewed: 09/02/2013 Elsevier Interactive Patient Education 2016 Elsevier Inc.  

## 2016-01-19 NOTE — ED Provider Notes (Signed)
CSN: [id number]     Arrival date & time 01/19/16  1311 History   First MD Initiated Contact with Patient 01/19/16 1339     Chief Complaint  Patient presents with  . Altered Mental Status     (Consider location/radiation/quality/duration/timing/severity/associated sxs/prior Treatment) HPI  82 year old female brought in by her daughter for evaluation of change in mental status. Worsening over the past week. Daughter states that she has been confused her own family members and forgetting the name of the dog which is very unusual for her. Patient keeps saying that she just wants to die. She says that she would rather die than go to a nursing home. She denies any acute complaints to me. No recent medication changes. No recent falls. Appetite has been fair. No urinary complaints. Per review of medical history, she has had multiple previous visits for depression and suicidal thoughts.  Past Medical History  Diagnosis Date  . Hypertension   . Osteoarthritis   . Hypercholesteremia   . Type II diabetes mellitus (HCC)   . Depression    Past Surgical History  Procedure Laterality Date  . Intramedullary (im) nail intertrochanteric Left 10/14/2012    Procedure: INTRAMEDULLARY (IM) NAIL INTERTROCHANTRIC;  Surgeon: Eulas Post, MD;  Location: [facility] OR;  Service: Orthopedics;  Laterality: Left;  . Cataract extraction w/ intraocular lens  implant, bilateral  1999-2004   Family History  Problem Relation Age of Onset  . Family history unknown: Yes   Social History  Substance Use Topics  . Smoking status: Never Smoker   . Smokeless tobacco: Never Used  . Alcohol Use: No     Comment: Patient denies    OB History    No data available     Review of Systems  All systems reviewed and negative, other than as noted in HPI.   Allergies  Celecoxib and Zoloft  Home Medications   Prior to Admission medications   Medication Sig Start Date End Date Taking? Authorizing Provider  aspirin 325 MG EC  tablet Take 325 mg by mouth daily as needed for pain.    Historical Provider, MD  calcium carbonate (OS-CAL) 600 MG TABS tablet Take 600 mg by mouth daily as needed (for supplementation).     Historical Provider, MD   BP 150/71 mmHg  Pulse 75  Temp(Src) 98 F (36.7 C) (Oral)  Resp 16  Ht  (1.575 m)  Wt 95 lb (43.092 kg)  BMI 17.37 kg/m2  SpO2 100% Physical Exam  Constitutional:  Elderly/frail. Disheveled appearance. No acute distress.  HENT:  Head: Normocephalic and atraumatic.  Eyes: Conjunctivae are normal. Right eye exhibits no discharge. Left eye exhibits no discharge.  Neck: Neck supple.  Cardiovascular: Normal rate, regular rhythm and normal heart sounds.  Exam reveals no gallop and no friction rub.   No murmur heard. Pulmonary/Chest: Effort normal and breath sounds normal. No respiratory distress.  Abdominal: Soft. She exhibits no distension. There is tenderness.  Very mild suprapubic tenderness. No rebound or guarding. No distention.  Musculoskeletal: She exhibits no edema or tenderness.  Neurological: She is alert. No cranial nerve deficit. Coordination normal.  Patient does not seem overtly confused to me. She does not answer orientation questions though and replies to most questions with "tightness when I die." She does follow commands though. Her cranial nerves II through XII are intact. Strength is normal bilateral upper and lower extremities.  Skin: Skin is warm and dry.  Psychiatric: She has a normal mood and affect.  Her behavior is normal. Thought content normal.  Nursing note and vitals reviewed.   ED Course  Procedures (including critical care time) Labs Review Labs Reviewed  COMPREHENSIVE METABOLIC PANEL - Abnormal; Notable for the following:    Glucose, Bld 114 (*)    BUN 25 (*)    Albumin 2.9 (*)    ALT 13 (*)    All other components within normal limits  CBC - Abnormal; Notable for the following:    WBC 3.1 (*)    Hemoglobin 11.4 (*)    All  other components within normal limits  URINALYSIS, ROUTINE W REFLEX MICROSCOPIC (NOT AT [facility]) - Abnormal; Notable for the following:    APPearance CLOUDY (*)    Protein, ur 30 (*)    Leukocytes, UA MODERATE (*)    All other components within normal limits  URINE MICROSCOPIC-ADD ON - Abnormal; Notable for the following:    Squamous Epithelial / LPF 6-30 (*)    Bacteria, UA RARE (*)    All other components within normal limits  URINE CULTURE  TROPONIN I  CBG MONITORING, ED    Imaging Review No results found. I have personally reviewed and evaluated these images and lab results as part of my medical decision-making.   EKG Interpretation   Date/Time:  Saturday January 19 2016 13:46:54 EDT Ventricular Rate:  74 PR Interval:  134 QRS Duration: 90 QT Interval:  370 QTC Calculation: 410 R Axis:   177 Text Interpretation:  Sinus or ectopic atrial rhythm Right ventricular  hypertrophy Anterolateral infarct, age indeterminate Baseline wander in  lead(s) I III aVR aVL aVF V1 Confirmed by Juleen ChinaKOHUT  MD, Jianni Batten ([id number]) on  01/19/2016 2:13:44 PM      MDM   Final diagnoses:  Depression  Confusion    82 year old female with depression and some confusion. She appears to have a long-standing history of depression. No acute stressor that she will admit to. She has no acute pain complaints. We'll check some basic studies including urinalysis. Her EKG is changed from priors, but she specifically denies any chest pain or dyspnea.  W/u fairly unremarkable. I suspect a large component of this is depression. I doubt emergent medical condition. It has been determined that no acute conditions requiring further emergency intervention are present at this time. The patient has been advised of the diagnosis and plan. I reviewed any labs and imaging including any potential incidental findings. We have discussed signs and symptoms that warrant return to the ED and they are listed in the discharge instructions.     Raeford RazorStephen Kennley Schwandt, MD 01/21/16 [phone]

## 2016-01-19 NOTE — ED Notes (Signed)
Family member reports pt having confusion since yesterday, not recognizing family. Pt is oriented to self and place, not time.

## 2016-01-21 ENCOUNTER — Encounter (HOSPITAL_COMMUNITY): Payer: Self-pay | Admitting: *Deleted

## 2016-01-21 ENCOUNTER — Emergency Department (HOSPITAL_COMMUNITY)
Admission: EM | Admit: 2016-01-21 | Discharge: 2016-01-21 | Disposition: A | Discharge status: Home / Self Care | Payer: Medicare Other | Attending: Emergency Medicine | Admitting: Emergency Medicine

## 2016-01-21 DIAGNOSIS — M1990 Unspecified osteoarthritis, unspecified site: Secondary | ICD-10-CM | POA: Insufficient documentation

## 2016-01-21 DIAGNOSIS — E119 Type 2 diabetes mellitus without complications: Secondary | ICD-10-CM | POA: Diagnosis not present

## 2016-01-21 DIAGNOSIS — F32A Depression, unspecified: Secondary | ICD-10-CM

## 2016-01-21 DIAGNOSIS — I10 Essential (primary) hypertension: Secondary | ICD-10-CM | POA: Diagnosis not present

## 2016-01-21 DIAGNOSIS — F329 Major depressive disorder, single episode, unspecified: Secondary | ICD-10-CM | POA: Insufficient documentation

## 2016-01-21 DIAGNOSIS — Z7982 Long term (current) use of aspirin: Secondary | ICD-10-CM | POA: Insufficient documentation

## 2016-01-21 LAB — COMPREHENSIVE METABOLIC PANEL
ALK PHOS: 57 U/L (ref 38–126)
ALT: 11 U/L — AB (ref 14–54)
AST: 17 U/L (ref 15–41)
Albumin: 3 g/dL — ABNORMAL LOW (ref 3.5–5.0)
Anion gap: 5 (ref 5–15)
BUN: 21 mg/dL — AB (ref 6–20)
CALCIUM: 9.1 mg/dL (ref 8.9–10.3)
CO2: 30 mmol/L (ref 22–32)
CREATININE: 0.61 mg/dL (ref 0.44–1.00)
Chloride: 104 mmol/L (ref 101–111)
GFR calc non Af Amer: >60 mL/min (ref >60)
GLUCOSE: 114 mg/dL — AB (ref 65–99)
Potassium: 4 mmol/L (ref 3.5–5.1)
SODIUM: 139 mmol/L (ref 135–145)
Total Bilirubin: 0.5 mg/dL (ref 0.3–1.2)
Total Protein: 6.7 g/dL (ref 6.5–8.1)

## 2016-01-21 LAB — CBC
HEMATOCRIT: 34.4 % — AB (ref 36.0–46.0)
HEMOGLOBIN: 10.9 g/dL — AB (ref 12.0–15.0)
MCH: 27.3 pg (ref 26.0–34.0)
MCHC: 31.7 g/dL (ref 30.0–36.0)
MCV: 86.2 fL (ref 78.0–100.0)
Platelets: 225 10*3/uL (ref 150–400)
RBC: 3.99 MIL/uL (ref 3.87–5.11)
RDW: 14.3 % (ref 11.5–15.5)
WBC: 3.1 10*3/uL — ABNORMAL LOW (ref 4.0–10.5)

## 2016-01-21 LAB — ETHANOL: Alcohol, Ethyl (B): <5 mg/dL (ref <5)

## 2016-01-21 LAB — SALICYLATE LEVEL

## 2016-01-21 LAB — ACETAMINOPHEN LEVEL: Acetaminophen (Tylenol), Serum: <10 ug/mL — ABNORMAL LOW (ref 10–30)

## 2016-01-21 NOTE — ED Notes (Signed)
Pt transported by EMS from home, states she does not know why she was brought to the ED and why.  Pt states "I just don't want to be here anymore, I just want it to end.  I'm just in the way."  Pt is calm and cooperative at this time.  Denies any complaints at this time.  Pt is ambulatory with one assist

## 2016-01-21 NOTE — Progress Notes (Addendum)
Pt with [facility] 7 ED visits and no admissions in the last 6 months  pcp listed as [facility]  ED CM familiar with pt She was seen in March 2017 when she refused home health services from Cm   ED Cm visited pt Sitter at bedside Pt sitting in recliner with her pursue on her lap  Pleasantly asked Cm to help her get home  Pt informed Cm she did not know why she was here Cm discussed home health services with pt but she refused Stating " I don't need anyone at my house helping me. I got my daughter. She sent me here today and I just want to go home." CM assessed pt for issues with getting meals, medications and dr appts  Pt denied having issues with any of these Pt asked again for Cm to assist her in getting home.  Sitter aware

## 2016-01-21 NOTE — Progress Notes (Signed)
ED CM was walking near triage and noted Sitter attempting to get pt back to her room Pt walking without assistive device nor hand held assistance pt walking with only standby assistance from sitter Pt noted with shuffling gait CM spoke with pt who if wanting to leave and go home Stating she is wanting for daughter to pick her up Cm noted odor of urine coming from pt clothes Cm assisted with pt return to her Triage room Pt sat on stretcher after looking with CM for her daughter "miriam" number [phone] CM called but no answer initially Pt updated Cm called again and was able to leave a voice message requesting a [facility] main number [phone] to speak with CM or pt RN  Updated pt

## 2016-01-21 NOTE — ED Notes (Signed)
Patient was alert, oriented and stable upon discharge. RN went over AVS and patient had no further questions.  

## 2016-01-21 NOTE — Progress Notes (Signed)
ED CM called Number pt provided for Pat and spoke with Roe Coombson who stated he is not sure who CM can call next Reports [place] and recommends trying [place].  He states he will try his brother also  Cm spoke with courtney whom states she just got home but have to go pick up her children before picking up pt ED NT lead made aware  CM and lead NT updated pt  Family Member, Dennie Bibleat [phone](Don) Pt informed that Roe CoombsDon wanted Cm to tell pt to "chill out someone is coming to get you"

## 2016-01-21 NOTE — Discharge Instructions (Signed)
Major Depressive Disorder Major depressive disorder is a mental illness. It also may be called clinical depression or unipolar depression. Major depressive disorder usually causes feelings of sadness, hopelessness, or helplessness. Some people with this disorder do not feel particularly sad but lose interest in doing things they used to enjoy (anhedonia). Major depressive disorder also can cause physical symptoms. It can interfere with work, school, relationships, and other normal everyday activities. The disorder varies in severity but is longer lasting and more serious than the sadness we all feel from time to time in our lives. Major depressive disorder often is triggered by stressful life events or major life changes. Examples of these triggers include divorce, loss of your job or home, a move, and the death of a family member or close friend. Sometimes this disorder occurs for no obvious reason at all. People who have family members with major depressive disorder or bipolar disorder are at higher risk for developing this disorder, with or without life stressors. Major depressive disorder can occur at any age. It may occur just once in your life (single episode major depressive disorder). It may occur multiple times (recurrent major depressive disorder). SYMPTOMS People with major depressive disorder have either anhedonia or depressed mood on nearly a daily basis for at least 2 weeks or longer. Symptoms of depressed mood include:  Feelings of sadness (blue or down in the dumps) or emptiness.  Feelings of hopelessness or helplessness.  Tearfulness or episodes of crying (may be observed by others).  Irritability (children and adolescents). In addition to depressed mood or anhedonia or both, people with this disorder have at least four of the following symptoms:  Difficulty sleeping or sleeping too much.   Significant change (increase or decrease) in appetite or weight.   Lack of energy or  motivation.  Feelings of guilt and worthlessness.   Difficulty concentrating, remembering, or making decisions.  Unusually slow movement (psychomotor retardation) or restlessness (as observed by others).   Recurrent wishes for death, recurrent thoughts of self-harm (suicide), or a suicide attempt. People with major depressive disorder commonly have persistent negative thoughts about themselves, other people, and the world. People with severe major depressive disorder may experiencedistorted beliefs or perceptions about the world (psychotic delusions). They also may see or hear things that are not real (psychotic hallucinations). DIAGNOSIS Major depressive disorder is diagnosed through an assessment by your health care provider. Your health care provider will ask aboutaspects of your daily life, such as mood,sleep, and appetite, to see if you have the diagnostic symptoms of major depressive disorder. Your health care provider may ask about your medical history and use of alcohol or drugs, including prescription medicines. Your health care provider also may do a physical exam and blood work. This is because certain medical conditions and the use of certain substances can cause major depressive disorder-like symptoms (secondary depression). Your health care provider also may refer you to a mental health specialist for further evaluation and treatment. TREATMENT It is important to recognize the symptoms of major depressive disorder and seek treatment. The following treatments can be prescribed for this disorder:   Medicine. Antidepressant medicines usually are prescribed. Antidepressant medicines are thought to correct chemical imbalances in the brain that are commonly associated with major depressive disorder. Other types of medicine may be added if the symptoms do not respond to antidepressant medicines alone or if psychotic delusions or hallucinations occur.  Talk therapy. Talk therapy can be  helpful in treating major depressive disorder by providing   support, education, and guidance. Certain types of talk therapy also can help with negative thinking (cognitive behavioral therapy) and with relationship issues that trigger this disorder (interpersonal therapy). A mental health specialist can help determine which treatment is best for you. Most people with major depressive disorder do well with a combination of medicine and talk therapy. Treatments involving electrical stimulation of the brain can be used in situations with extremely severe symptoms or when medicine and talk therapy do not work over time. These treatments include electroconvulsive therapy, transcranial magnetic stimulation, and vagal nerve stimulation.   This information is not intended to replace advice given to you by your health care provider. Make sure you discuss any questions you have with your health care provider.   Document Released: 11/22/2012 Document Revised: 08/18/2014 Document Reviewed: 11/22/2012 Elsevier Interactive Patient Education 2016 Elsevier Inc.  

## 2016-01-21 NOTE — Progress Notes (Signed)
ED CM came to CM to provide numbers pt gave her to call her "granddaughter to pick me up" Nelson,Courtney Grandaughter   [phone]   CM left a message requesting a return call to [phone]   Woodfin GanjaMiller,Mary Catherine Grandaughter   [phone]  Left a voice message   Hassell HalimJackowicz,Miriam Daughter   [phone]  Left voice message previously No return call at this time   Clarita CraneMcCleary,McKenna Grandaughter   [phone]   no answer   Pending return call back from family for pick up Discussed with NT that if needed [employer] may be called to visit pt home for family

## 2016-01-21 NOTE — ED Provider Notes (Signed)
CSN: [id number]     Arrival date & time 01/21/16  1348 History   First MD Initiated Contact with Patient 01/21/16 1615     Chief Complaint  Patient presents with  . Depression     (Consider location/radiation/quality/duration/timing/severity/associated sxs/prior Treatment) Patient is a 82 y.o. female presenting with depression. The history is provided by the patient.  Depression This is a chronic problem. The problem occurs constantly. The problem has not changed since onset.Pertinent negatives include no chest pain, no abdominal pain, no headaches and no shortness of breath. Nothing aggravates the symptoms. Nothing relieves the symptoms. She has tried nothing for the symptoms.    Past Medical History  Diagnosis Date  . Hypertension   . Osteoarthritis   . Hypercholesteremia   . Type II diabetes mellitus (HCC)   . Depression    Past Surgical History  Procedure Laterality Date  . Intramedullary (im) nail intertrochanteric Left 10/14/2012    Procedure: INTRAMEDULLARY (IM) NAIL INTERTROCHANTRIC;  Surgeon: Eulas Post, MD;  Location: [facility] OR;  Service: Orthopedics;  Laterality: Left;  . Cataract extraction w/ intraocular lens  implant, bilateral  1999-2004   Family History  Problem Relation Age of Onset  . Family history unknown: Yes   Social History  Substance Use Topics  . Smoking status: Never Smoker   . Smokeless tobacco: Never Used  . Alcohol Use: No     Comment: Patient denies    OB History    No data available     Review of Systems  Respiratory: Negative for shortness of breath.   Cardiovascular: Negative for chest pain.  Gastrointestinal: Negative for abdominal pain.  Neurological: Negative for headaches.  Psychiatric/Behavioral: Positive for depression.  All other systems reviewed and are negative.     Allergies  Celecoxib and Zoloft  Home Medications   Prior to Admission medications   Medication Sig Start Date End Date Taking? Authorizing Provider   aspirin 325 MG EC tablet Take 325 mg by mouth daily as needed for pain.   Yes Historical Provider, MD   BP 134/70 mmHg  Pulse 78  Temp(Src) 98 F (36.7 C) (Oral)  Resp 18  SpO2 99% Physical Exam  Constitutional: She is oriented to person, place, and time. She appears well-developed and well-nourished. No distress.  HENT:  Head: Normocephalic.  Eyes: Conjunctivae are normal.  Neck: Neck supple. No tracheal deviation present.  Cardiovascular: Normal rate and regular rhythm.   Pulmonary/Chest: Effort normal. No respiratory distress.  Abdominal: Soft. She exhibits no distension.  Neurological: She is alert and oriented to person, place, and time. She has normal strength. No cranial nerve deficit. GCS eye subscore is 4. GCS verbal subscore is 5. GCS motor subscore is 6.  Skin: Skin is warm and dry.  Psychiatric: She has a normal mood and affect. Her speech is normal and behavior is normal.    ED Course  Procedures (including critical care time) Labs Review Labs Reviewed  COMPREHENSIVE METABOLIC PANEL - Abnormal; Notable for the following:    Glucose, Bld 114 (*)    BUN 21 (*)    Albumin 3.0 (*)    ALT 11 (*)    All other components within normal limits  ACETAMINOPHEN LEVEL - Abnormal; Notable for the following:    Acetaminophen (Tylenol), Serum <10 (*)    All other components within normal limits  CBC - Abnormal; Notable for the following:    WBC 3.1 (*)    Hemoglobin 10.9 (*)    HCT  34.4 (*)    All other components within normal limits  ETHANOL  SALICYLATE LEVEL  URINE RAPID DRUG SCREEN, HOSP PERFORMED    Imaging Review No results found. I have personally reviewed and evaluated these images and lab results as part of my medical decision-making.   EKG Interpretation None      MDM   Final diagnoses:  Depression    82 y.o. female presents with being dropped off by daughter with concern for ongoing depression. No SI/HI/AVH here. No acute medical complaints and  labs are stable from prior. No indication for admission or emergent psychiatric consultation. Pt complains "I've been here too long, I just want to go home and not come back to the hospital". I feel she is appropriate for discharge and was under care of family member who came to get her.     Lyndal Pulleyaniel Ferguson Gertner, MD 01/22/16 [phone]

## 2016-02-02 ENCOUNTER — Emergency Department (HOSPITAL_COMMUNITY)
Admission: EM | Admit: 2016-02-02 | Discharge: 2016-02-02 | Disposition: A | Discharge status: Home / Self Care | Payer: Medicare Other | Attending: Emergency Medicine | Admitting: Emergency Medicine

## 2016-02-02 ENCOUNTER — Encounter (HOSPITAL_COMMUNITY): Payer: Self-pay | Admitting: *Deleted

## 2016-02-02 DIAGNOSIS — E7800 Pure hypercholesterolemia, unspecified: Secondary | ICD-10-CM | POA: Diagnosis not present

## 2016-02-02 DIAGNOSIS — S51011A Laceration without foreign body of right elbow, initial encounter: Secondary | ICD-10-CM | POA: Insufficient documentation

## 2016-02-02 DIAGNOSIS — W010XXA Fall on same level from slipping, tripping and stumbling without subsequent striking against object, initial encounter: Secondary | ICD-10-CM | POA: Diagnosis not present

## 2016-02-02 DIAGNOSIS — E119 Type 2 diabetes mellitus without complications: Secondary | ICD-10-CM | POA: Insufficient documentation

## 2016-02-02 DIAGNOSIS — I10 Essential (primary) hypertension: Secondary | ICD-10-CM | POA: Diagnosis not present

## 2016-02-02 DIAGNOSIS — Y9289 Other specified places as the place of occurrence of the external cause: Secondary | ICD-10-CM | POA: Diagnosis not present

## 2016-02-02 DIAGNOSIS — Z7982 Long term (current) use of aspirin: Secondary | ICD-10-CM | POA: Diagnosis not present

## 2016-02-02 DIAGNOSIS — Y999 Unspecified external cause status: Secondary | ICD-10-CM | POA: Insufficient documentation

## 2016-02-02 DIAGNOSIS — Y939 Activity, unspecified: Secondary | ICD-10-CM | POA: Diagnosis not present

## 2016-02-02 NOTE — Discharge Instructions (Signed)
Please read and follow all provided instructions.  Your diagnoses today include:  1. Laceration of right elbow, initial encounter     Tests performed today include:  Vital signs. See below for your results today.   Medications prescribed:   None  Take any prescribed medications only as directed.   Home care instructions:  Follow any educational materials and wound care instructions contained in this packet.   Keep affected area above the level of your heart when possible to minimize swelling. Wash area gently twice a day with warm soapy water. Do not apply alcohol or hydrogen peroxide. Cover the area if it draining or weeping.   Return instructions:  Return to the Emergency Department if you have:  Fever  Worsening pain  Worsening swelling of the wound  Pus draining from the wound  Redness of the skin that moves away from the wound, especially if it streaks away from the affected area   Any other emergent concerns  --------------

## 2016-02-02 NOTE — ED Provider Notes (Signed)
CSN: [id number]     Arrival date & time 02/02/16  1635 History   First MD Initiated Contact with Patient 02/02/16 1639     Chief Complaint  Patient presents with  . Fall     (Consider location/radiation/quality/duration/timing/severity/associated sxs/prior Treatment) HPI Comments: Patient presents with complaint of minor laceration to right elbow sustained just prior to arrival. Patient states that she slipped on her porch that was wet from the rain. She did not hit her head or hurt her neck. She complains of laceration only. She is able to move her elbow well without difficulty. Patient was on her way to pick up her daughter from the emergency department. No other medical problems reported. Patient does not take blood thinners.  Patient is a 82 y.o. female presenting with fall. The history is provided by the patient.  Fall Pertinent negatives include no arthralgias, headaches, myalgias, neck pain or vomiting.    Past Medical History  Diagnosis Date  . Hypertension   . Osteoarthritis   . Hypercholesteremia   . Type II diabetes mellitus (HCC)   . Depression    Past Surgical History  Procedure Laterality Date  . Intramedullary (im) nail intertrochanteric Left 10/14/2012    Procedure: INTRAMEDULLARY (IM) NAIL INTERTROCHANTRIC;  Surgeon: Eulas PostJoshua P Landau, MD;  Location: [facility] OR;  Service: Orthopedics;  Laterality: Left;  . Cataract extraction w/ intraocular lens  implant, bilateral  1999-2004   Family History  Problem Relation Age of Onset  . Family history unknown: Yes   Social History  Substance Use Topics  . Smoking status: Never Smoker   . Smokeless tobacco: Never Used  . Alcohol Use: No     Comment: Patient denies    OB History    No data available     Review of Systems  Gastrointestinal: Negative for vomiting.  Musculoskeletal: Negative for myalgias, back pain, arthralgias and neck pain.  Skin: Positive for wound.  Neurological: Negative for headaches.   Psychiatric/Behavioral: Negative for confusion.      Allergies  Celecoxib and Zoloft  Home Medications   Prior to Admission medications   Medication Sig Start Date End Date Taking? Authorizing Provider  aspirin 325 MG EC tablet Take 325 mg by mouth daily as needed for pain.    Historical Provider, MD   Ht 5\' 5"  (1.651 m)  Wt 49.896 kg  BMI 18.31 kg/m2 Physical Exam  Constitutional: She appears well-developed and well-nourished.  HENT:  Head: Normocephalic and atraumatic.  Eyes: Conjunctivae are normal.  Neck: Normal range of motion. Neck supple.  Pulmonary/Chest: No respiratory distress.  Musculoskeletal:       Right shoulder: Normal. She exhibits normal range of motion.       Right elbow: She exhibits normal range of motion and no swelling. No tenderness found.       Right wrist: Normal.       Cervical back: Normal.  Neurological: She is alert.  Skin: Skin is warm and dry.  1 cm curvilinear skin tear/flap laceration to the lateral right elbow. Patient has full range of motion of elbow without pain. Slight venous ooze.  Psychiatric: She has a normal mood and affect.  Nursing note and vitals reviewed.   ED Course  Procedures (including critical care time)  5:03 PM Patient seen and examined. Called the room as patient wanted to leave immediately upon arrival. She just wanted a Band-Aid. Patient examined. She allowed me to place 3 Steri-Strips to affix wound edges in place. Counseled on wound  care, signs and symptoms to return. Discharged home at this time.   Vital signs reviewed and are as follows: Ht 5\' 5"  (1.651 m)  Wt 49.896 kg  BMI 18.31 kg/m2  Pt urged to return with worsening pain, worsening swelling, expanding area of redness or streaking up extremity, fever, or any other concerns. Pt verbalizes understanding and agrees with plan.   MDM   Final diagnoses:  Laceration of right elbow, initial encounter   Patient with minor right elbow laceration. No bony  injury suspected. No head or neck injury suspected. Wound care instructions and follow-up as above.    Renne CriglerJoshua Atley Neubert, PA-C 02/02/16 1704  Vanetta MuldersScott Zackowski, MD 02/02/16 [phone]

## 2016-02-02 NOTE — ED Notes (Signed)
The pt is c/o a fall when she slipped and fell on her wet porch from the rain today.  She has an avulsive abrasion to her rt elbow  She denies pain anywhere else  Daughter at the bedside

## 2016-02-04 ENCOUNTER — Encounter (HOSPITAL_COMMUNITY): Payer: Self-pay | Admitting: Emergency Medicine

## 2016-02-04 DIAGNOSIS — I10 Essential (primary) hypertension: Secondary | ICD-10-CM | POA: Diagnosis not present

## 2016-02-04 DIAGNOSIS — E119 Type 2 diabetes mellitus without complications: Secondary | ICD-10-CM | POA: Insufficient documentation

## 2016-02-04 DIAGNOSIS — Z7982 Long term (current) use of aspirin: Secondary | ICD-10-CM | POA: Diagnosis not present

## 2016-02-04 DIAGNOSIS — Z5321 Procedure and treatment not carried out due to patient leaving prior to being seen by health care provider: Secondary | ICD-10-CM | POA: Insufficient documentation

## 2016-02-04 DIAGNOSIS — R1030 Lower abdominal pain, unspecified: Secondary | ICD-10-CM | POA: Diagnosis not present

## 2016-02-04 DIAGNOSIS — K5900 Constipation, unspecified: Secondary | ICD-10-CM | POA: Diagnosis present

## 2016-02-04 LAB — COMPREHENSIVE METABOLIC PANEL
ALK PHOS: 64 U/L (ref 38–126)
ALT: 10 U/L — ABNORMAL LOW (ref 14–54)
ANION GAP: 7 (ref 5–15)
AST: 18 U/L (ref 15–41)
Albumin: 3.4 g/dL — ABNORMAL LOW (ref 3.5–5.0)
BILIRUBIN TOTAL: 0.9 mg/dL (ref 0.3–1.2)
BUN: 10 mg/dL (ref 6–20)
CALCIUM: 9.4 mg/dL (ref 8.9–10.3)
CO2: 26 mmol/L (ref 22–32)
Chloride: 104 mmol/L (ref 101–111)
Creatinine, Ser: 0.9 mg/dL (ref 0.44–1.00)
GFR, EST NON AFRICAN AMERICAN: 56 mL/min — AB (ref >60)
Glucose, Bld: 118 mg/dL — ABNORMAL HIGH (ref 65–99)
Potassium: 3.7 mmol/L (ref 3.5–5.1)
Sodium: 137 mmol/L (ref 135–145)
TOTAL PROTEIN: 7.5 g/dL (ref 6.5–8.1)

## 2016-02-04 LAB — CBC WITH DIFFERENTIAL/PLATELET
Basophils Absolute: 0 10*3/uL (ref 0.0–0.1)
Basophils Relative: 0 %
EOS ABS: 0.1 10*3/uL (ref 0.0–0.7)
Eosinophils Relative: 2 %
HEMATOCRIT: 40.4 % (ref 36.0–46.0)
HEMOGLOBIN: 12.8 g/dL (ref 12.0–15.0)
LYMPHS ABS: 0.8 10*3/uL (ref 0.7–4.0)
Lymphocytes Relative: 17 %
MCH: 27.8 pg (ref 26.0–34.0)
MCHC: 31.7 g/dL (ref 30.0–36.0)
MCV: 87.8 fL (ref 78.0–100.0)
MONO ABS: 0.3 10*3/uL (ref 0.1–1.0)
MONOS PCT: 7 %
NEUTROS ABS: 3.5 10*3/uL (ref 1.7–7.7)
NEUTROS PCT: 74 %
Platelets: 155 10*3/uL (ref 150–400)
RBC: 4.6 MIL/uL (ref 3.87–5.11)
RDW: 14.8 % (ref 11.5–15.5)
WBC: 4.7 10*3/uL (ref 4.0–10.5)

## 2016-02-04 NOTE — ED Notes (Signed)
Pt. reports constipation for several days , last BM 3 days ago , denies fever or chills , mild low abdominal discomfort .

## 2016-02-05 ENCOUNTER — Emergency Department (HOSPITAL_COMMUNITY)
Admission: EM | Admit: 2016-02-05 | Discharge: 2016-02-05 | Disposition: A | Discharge status: Home / Self Care | Payer: Medicare Other | Attending: Dermatology | Admitting: Dermatology

## 2016-02-05 NOTE — ED Notes (Signed)
Patient called for VS.  Patient not in waiting room.

## 2016-02-15 ENCOUNTER — Emergency Department (HOSPITAL_COMMUNITY): Payer: Medicare Other

## 2016-02-15 ENCOUNTER — Emergency Department (HOSPITAL_COMMUNITY)
Admission: EM | Admit: 2016-02-15 | Discharge: 2016-02-15 | Disposition: A | Discharge status: Home / Self Care | Payer: Medicare Other | Attending: Emergency Medicine | Admitting: Emergency Medicine

## 2016-02-15 ENCOUNTER — Encounter (HOSPITAL_COMMUNITY): Payer: Self-pay | Admitting: Emergency Medicine

## 2016-02-15 DIAGNOSIS — F329 Major depressive disorder, single episode, unspecified: Secondary | ICD-10-CM | POA: Insufficient documentation

## 2016-02-15 DIAGNOSIS — R4182 Altered mental status, unspecified: Secondary | ICD-10-CM | POA: Insufficient documentation

## 2016-02-15 DIAGNOSIS — Y939 Activity, unspecified: Secondary | ICD-10-CM | POA: Insufficient documentation

## 2016-02-15 DIAGNOSIS — Y929 Unspecified place or not applicable: Secondary | ICD-10-CM | POA: Diagnosis not present

## 2016-02-15 DIAGNOSIS — Y999 Unspecified external cause status: Secondary | ICD-10-CM | POA: Diagnosis not present

## 2016-02-15 DIAGNOSIS — Z7982 Long term (current) use of aspirin: Secondary | ICD-10-CM | POA: Insufficient documentation

## 2016-02-15 DIAGNOSIS — W1830XA Fall on same level, unspecified, initial encounter: Secondary | ICD-10-CM | POA: Insufficient documentation

## 2016-02-15 DIAGNOSIS — R4589 Other symptoms and signs involving emotional state: Secondary | ICD-10-CM

## 2016-02-15 DIAGNOSIS — I10 Essential (primary) hypertension: Secondary | ICD-10-CM | POA: Diagnosis not present

## 2016-02-15 DIAGNOSIS — W19XXXA Unspecified fall, initial encounter: Secondary | ICD-10-CM

## 2016-02-15 DIAGNOSIS — E119 Type 2 diabetes mellitus without complications: Secondary | ICD-10-CM | POA: Insufficient documentation

## 2016-02-15 LAB — URINE MICROSCOPIC-ADD ON
RBC / HPF: NONE SEEN RBC/hpf (ref 0–5)
WBC UA: NONE SEEN WBC/hpf (ref 0–5)

## 2016-02-15 LAB — CBC
HEMATOCRIT: 36.5 % (ref 36.0–46.0)
HEMOGLOBIN: 11.3 g/dL — AB (ref 12.0–15.0)
MCH: 27.3 pg (ref 26.0–34.0)
MCHC: 31 g/dL (ref 30.0–36.0)
MCV: 88.2 fL (ref 78.0–100.0)
Platelets: 165 10*3/uL (ref 150–400)
RBC: 4.14 MIL/uL (ref 3.87–5.11)
RDW: 15 % (ref 11.5–15.5)
WBC: 3.9 10*3/uL — ABNORMAL LOW (ref 4.0–10.5)

## 2016-02-15 LAB — COMPREHENSIVE METABOLIC PANEL
ALBUMIN: 3.2 g/dL — AB (ref 3.5–5.0)
ALK PHOS: 70 U/L (ref 38–126)
ALT: 8 U/L — ABNORMAL LOW (ref 14–54)
ANION GAP: 8 (ref 5–15)
AST: 15 U/L (ref 15–41)
BUN: 15 mg/dL (ref 6–20)
CALCIUM: 9.1 mg/dL (ref 8.9–10.3)
CO2: 27 mmol/L (ref 22–32)
Chloride: 104 mmol/L (ref 101–111)
Creatinine, Ser: 0.74 mg/dL (ref 0.44–1.00)
GFR calc Af Amer: >60 mL/min (ref >60)
GFR calc non Af Amer: >60 mL/min (ref >60)
GLUCOSE: 90 mg/dL (ref 65–99)
POTASSIUM: 4 mmol/L (ref 3.5–5.1)
SODIUM: 139 mmol/L (ref 135–145)
Total Bilirubin: 0.6 mg/dL (ref 0.3–1.2)
Total Protein: 7.1 g/dL (ref 6.5–8.1)

## 2016-02-15 LAB — URINALYSIS, ROUTINE W REFLEX MICROSCOPIC
GLUCOSE, UA: NEGATIVE mg/dL
Hgb urine dipstick: NEGATIVE
Ketones, ur: NEGATIVE mg/dL
Leukocytes, UA: NEGATIVE
Nitrite: NEGATIVE
PH: 6 (ref 5.0–8.0)
PROTEIN: 30 mg/dL — AB
SPECIFIC GRAVITY, URINE: 1.024 (ref 1.005–1.030)

## 2016-02-15 LAB — I-STAT TROPONIN, ED: Troponin i, poc: 0 ng/mL (ref 0.00–0.08)

## 2016-02-15 LAB — CBG MONITORING, ED: Glucose-Capillary: 84 mg/dL (ref 65–99)

## 2016-02-15 NOTE — ED Notes (Signed)
Pt states "I dont have a brain anymore, I dont want to live anymore.". Pt answers most orientation questions correctly. Pt smells strongly of urine. Pt states shes confused and doesn't know where she is.

## 2016-02-15 NOTE — ED Notes (Signed)
Family member (daughter) states shes been increasingly more confused in hte last month.

## 2016-02-15 NOTE — ED Provider Notes (Signed)
CSN: [id number]     Arrival date & time 02/15/16  1533 History   First MD Initiated Contact with Patient 02/15/16 1917     Chief Complaint  Patient presents with  . Altered Mental Status  . Depression  . Medical Clearance    (Consider location/radiation/quality/duration/timing/severity/associated sxs/prior Treatment) Patient is a 82 y.o. female presenting with fall. The history is provided by a relative and the patient.  Fall This is a recurrent problem. The current episode started today. The problem occurs intermittently. The problem has been unchanged. Pertinent negatives include no abdominal pain, anorexia, chest pain, chills, congestion, coughing, fatigue, fever, headaches, joint swelling, nausea, neck pain, numbness, rash, sore throat, visual change, vomiting or weakness. Nothing aggravates the symptoms. She has tried nothing for the symptoms. The treatment provided no relief.    Past Medical History  Diagnosis Date  . Hypertension   . Osteoarthritis   . Hypercholesteremia   . Type II diabetes mellitus (HCC)   . Depression    Past Surgical History  Procedure Laterality Date  . Intramedullary (im) nail intertrochanteric Left 10/14/2012    Procedure: INTRAMEDULLARY (IM) NAIL INTERTROCHANTRIC;  Surgeon: Eulas PostJoshua P Landau, MD;  Location: [facility] OR;  Service: Orthopedics;  Laterality: Left;  . Cataract extraction w/ intraocular lens  implant, bilateral  1999-2004   Family History  Problem Relation Age of Onset  . Family history unknown: Yes   Social History  Substance Use Topics  . Smoking status: Never Smoker   . Smokeless tobacco: Never Used  . Alcohol Use: No     Comment: Patient denies    OB History    No data available     Review of Systems  Constitutional: Negative for fever, chills and fatigue.  HENT: Negative for congestion and sore throat.   Eyes: Negative for pain.  Respiratory: Negative for cough and shortness of breath.   Cardiovascular: Negative for chest pain  and palpitations.  Gastrointestinal: Negative for nausea, vomiting, abdominal pain, diarrhea and anorexia.  Genitourinary: Negative for dysuria and flank pain.  Musculoskeletal: Negative for back pain, joint swelling and neck pain.  Skin: Negative for rash.  Allergic/Immunologic: Negative.   Neurological: Negative for dizziness, weakness, light-headedness, numbness and headaches.  Psychiatric/Behavioral: Positive for dysphoric mood. Negative for suicidal ideas, confusion and self-injury. The patient is not nervous/anxious.       Allergies  Celecoxib and Zoloft  Home Medications   Prior to Admission medications   Medication Sig Start Date End Date Taking? Authorizing Provider  aspirin 325 MG EC tablet Take 325 mg by mouth daily as needed for pain.    Historical Provider, MD   BP 128/69 mmHg  Pulse 90  Temp(Src) 98 F (36.7 C) (Oral)  Resp 17  SpO2 99% Physical Exam  Constitutional: She is oriented to person, place, and time. She appears well-developed and well-nourished. No distress.  HENT:  Head: Normocephalic and atraumatic.  No hyphema, nasal septal hematoma, hemotympanum, battles sign, racoon eyes, or trismus.     Eyes: Conjunctivae and EOM are normal. Pupils are equal, round, and reactive to light.  Neck: Normal range of motion. Neck supple.  Cardiovascular: Normal rate, regular rhythm and normal heart sounds.   Pulmonary/Chest: Effort normal and breath sounds normal. No respiratory distress.  Abdominal: Soft. Bowel sounds are normal. There is no tenderness.  Musculoskeletal: Normal range of motion.  Neurological: She is alert and oriented to person, place, and time. She has normal reflexes. No cranial nerve deficit.  Skin: Skin  is warm and dry. She is not diaphoretic.  Psychiatric: She has a normal mood and affect. Her speech is normal and behavior is normal. Judgment and thought content normal. She expresses no suicidal ideation. She expresses no suicidal plans.     ED Course  Procedures (including critical care time) Labs Review Labs Reviewed  COMPREHENSIVE METABOLIC PANEL - Abnormal; Notable for the following:    Albumin 3.2 (*)    ALT 8 (*)    All other components within normal limits  CBC - Abnormal; Notable for the following:    WBC 3.9 (*)    Hemoglobin 11.3 (*)    All other components within normal limits  URINALYSIS, ROUTINE W REFLEX MICROSCOPIC (NOT AT [facility]) - Abnormal; Notable for the following:    Bilirubin Urine SMALL (*)    Protein, ur 30 (*)    All other components within normal limits  URINE MICROSCOPIC-ADD ON - Abnormal; Notable for the following:    Squamous Epithelial / LPF 0-5 (*)    Bacteria, UA RARE (*)    Casts HYALINE CASTS (*)    All other components within normal limits  CBG MONITORING, ED  Rosezena Sensor, ED    Imaging Review Dg Chest 2 View  02/15/2016  CLINICAL DATA:  Confusion.  Depression. EXAM: CHEST  2 VIEW COMPARISON:  10/16/2015 FINDINGS: Heart size is normal. Large hiatal hernia. Aortic atherosclerosis. The lungs are clear. No effusions. Ordinary degenerative changes affect the spine. IMPRESSION: Hiatal hernia. Aortic atherosclerosis. No acute finding Electronically Signed   By: Paulina Fusi M.D.   On: 02/15/2016 19:43   Ct Head Wo Contrast  02/15/2016  CLINICAL DATA:  Stated history of fall.  Confusion. EXAM: CT HEAD WITHOUT CONTRAST CT CERVICAL SPINE WITHOUT CONTRAST TECHNIQUE: Multidetector CT imaging of the head and cervical spine was performed following the standard protocol without intravenous contrast. Multiplanar CT image reconstructions of the cervical spine were also generated. COMPARISON:  Head and cervical spine CT 10/16/2015 FINDINGS: CT HEAD FINDINGS Stable atrophy and chronic small vessel ischemia. Small remote lacunar infarct in right basal ganglia. No intracranial hemorrhage, mass effect, or midline shift. No hydrocephalus. The basilar cisterns are patent. No evidence of territorial infarct.  No intracranial fluid collection. Atherosclerosis of skullbase vasculature. Calvarium is intact. Included paranasal sinuses and mastoid air cells are well aerated. CT CERVICAL SPINE FINDINGS No fracture or acute subluxation. The dens is intact. There are no jumped or perched facets. The alignment is unchanged. Unchanged minimal retrolisthesis of C3 on C4, anterolisthesis of C4 on C5 and C5 on C6. Multilevel disc space narrowing and endplate spurring, stable. Multilevel facet arthropathy. No prevertebral soft tissue edema. Carotid atherosclerosis. Heterogeneous thyroid gland, similar to prior. IMPRESSION: 1. No acute intracranial abnormality. Stable atrophy and chronic small vessel ischemia. 2. No acute fracture or subluxation of the cervical spine. Degenerative changes stable from prior. Electronically Signed   By: Rubye Oaks M.D.   On: 02/15/2016 20:17   Ct Cervical Spine Wo Contrast  02/15/2016  CLINICAL DATA:  Stated history of fall.  Confusion. EXAM: CT HEAD WITHOUT CONTRAST CT CERVICAL SPINE WITHOUT CONTRAST TECHNIQUE: Multidetector CT imaging of the head and cervical spine was performed following the standard protocol without intravenous contrast. Multiplanar CT image reconstructions of the cervical spine were also generated. COMPARISON:  Head and cervical spine CT 10/16/2015 FINDINGS: CT HEAD FINDINGS Stable atrophy and chronic small vessel ischemia. Small remote lacunar infarct in right basal ganglia. No intracranial hemorrhage, mass effect, or midline  shift. No hydrocephalus. The basilar cisterns are patent. No evidence of territorial infarct. No intracranial fluid collection. Atherosclerosis of skullbase vasculature. Calvarium is intact. Included paranasal sinuses and mastoid air cells are well aerated. CT CERVICAL SPINE FINDINGS No fracture or acute subluxation. The dens is intact. There are no jumped or perched facets. The alignment is unchanged. Unchanged minimal retrolisthesis of C3 on C4,  anterolisthesis of C4 on C5 and C5 on C6. Multilevel disc space narrowing and endplate spurring, stable. Multilevel facet arthropathy. No prevertebral soft tissue edema. Carotid atherosclerosis. Heterogeneous thyroid gland, similar to prior. IMPRESSION: 1. No acute intracranial abnormality. Stable atrophy and chronic small vessel ischemia. 2. No acute fracture or subluxation of the cervical spine. Degenerative changes stable from prior. Electronically Signed   By: Rubye OaksMelanie  Ehinger M.D.   On: 02/15/2016 20:17   I have personally reviewed and evaluated these images and lab results as part of my medical decision-making.   EKG Interpretation   Date/Time:  Friday February 15 2016 15:42:10 EDT Ventricular Rate:  74 PR Interval:  144 QRS Duration: 82 QT Interval:  414 QTC Calculation: 459 R Axis:   49 Text Interpretation:  Normal sinus rhythm Septal infarct , age  undetermined Abnormal ECG No significant change since last tracing  Confirmed by YAO  MD, DAVID ([id number]) on 02/15/2016 8:38:21 PM      MDM   Final diagnoses:  Fall, initial encounter  Depressed mood    The pt is an 82 yo female presenting for fall and depressed mood.  Daughter reports pt has fallen twice over the last several days.  The pts main caregiver is daughter at bedside who reports main concern is her mothers depressed mood.  Reports her mother says at times "just let me die" but displays no acts to harm herself.  Daughter denies any AMS today.    On exam no evidence of long bone or pelvic injury requiring injury.  CT head and C spine with out acute abnormalities.  CBC, CMP, Trop, UA and ECG unremarkable.  Daughter reports mechanical falls and low suspicion for syncope.  No evidence of stroke on exam.  Pt denies wanting to harm herself or die at this point with no AVH.  Discussed that with patients age I do not feel comfortable prescribing antidepressants and will have to pursue further evaluation with PCP.   Labs were viewed by  myself  incorporated into medical decision making.  Discussed pertinent finding with patient or caregiver prior to discharge with no further questions.  Immediate return precautions given and understood.  Medical decision making supervised by my attending Dr. Silverio LayYao.  Tery SanfilippoMatthew Shaune Malacara, MD PGY-3 Emergency Medicine     Tery SanfilippoMatthew Jilliann Subramanian, MD 02/16/16 1132  Richardean Canalavid H Yao, MD 02/17/16 [phone]

## 2016-02-15 NOTE — ED Notes (Signed)
Pt continues to call out stating she wants to go home.  Primary RN is aware.  This RN went in to check on situation.  Pt is stating she is ready to go home.  Family member at bedside states "well, our show just came on, so we can wait a little while".  Pt is comfortable.  This RN asked if she could do anything while they wait.  Pt/family states they are okay.

## 2016-02-20 ENCOUNTER — Encounter (HOSPITAL_COMMUNITY): Payer: Self-pay | Admitting: Emergency Medicine

## 2016-02-20 ENCOUNTER — Emergency Department (HOSPITAL_COMMUNITY)
Admission: EM | Admit: 2016-02-20 | Discharge: 2016-02-22 | Disposition: A | Discharge status: Home / Self Care | Payer: Medicare Other | Attending: Emergency Medicine | Admitting: Emergency Medicine

## 2016-02-20 DIAGNOSIS — R4182 Altered mental status, unspecified: Secondary | ICD-10-CM | POA: Diagnosis not present

## 2016-02-20 DIAGNOSIS — F329 Major depressive disorder, single episode, unspecified: Secondary | ICD-10-CM | POA: Diagnosis not present

## 2016-02-20 DIAGNOSIS — Z7982 Long term (current) use of aspirin: Secondary | ICD-10-CM | POA: Diagnosis not present

## 2016-02-20 DIAGNOSIS — E119 Type 2 diabetes mellitus without complications: Secondary | ICD-10-CM | POA: Insufficient documentation

## 2016-02-20 DIAGNOSIS — I10 Essential (primary) hypertension: Secondary | ICD-10-CM | POA: Insufficient documentation

## 2016-02-20 DIAGNOSIS — M1990 Unspecified osteoarthritis, unspecified site: Secondary | ICD-10-CM | POA: Diagnosis not present

## 2016-02-20 LAB — BASIC METABOLIC PANEL
ANION GAP: 8 (ref 5–15)
BUN: 16 mg/dL (ref 6–20)
CHLORIDE: 104 mmol/L (ref 101–111)
CO2: 29 mmol/L (ref 22–32)
Calcium: 9.9 mg/dL (ref 8.9–10.3)
Creatinine, Ser: 0.8 mg/dL (ref 0.44–1.00)
GFR calc Af Amer: >60 mL/min (ref >60)
GLUCOSE: 162 mg/dL — AB (ref 65–99)
POTASSIUM: 4.1 mmol/L (ref 3.5–5.1)
SODIUM: 141 mmol/L (ref 135–145)

## 2016-02-20 LAB — CBC WITH DIFFERENTIAL/PLATELET
BASOS ABS: 0 10*3/uL (ref 0.0–0.1)
Basophils Relative: 1 %
Eosinophils Absolute: 0 10*3/uL (ref 0.0–0.7)
Eosinophils Relative: 1 %
HEMATOCRIT: 41.9 % (ref 36.0–46.0)
Hemoglobin: 13.4 g/dL (ref 12.0–15.0)
LYMPHS ABS: 0.6 10*3/uL — AB (ref 0.7–4.0)
LYMPHS PCT: 16 %
MCH: 28 pg (ref 26.0–34.0)
MCHC: 32 g/dL (ref 30.0–36.0)
MCV: 87.7 fL (ref 78.0–100.0)
Monocytes Absolute: 0.2 10*3/uL (ref 0.1–1.0)
Monocytes Relative: 5 %
NEUTROS ABS: 2.8 10*3/uL (ref 1.7–7.7)
Neutrophils Relative %: 77 %
Platelets: 186 10*3/uL (ref 150–400)
RBC: 4.78 MIL/uL (ref 3.87–5.11)
RDW: 14.7 % (ref 11.5–15.5)
WBC: 3.6 10*3/uL — AB (ref 4.0–10.5)

## 2016-02-20 LAB — URINALYSIS, ROUTINE W REFLEX MICROSCOPIC
Glucose, UA: NEGATIVE mg/dL
Hgb urine dipstick: NEGATIVE
Ketones, ur: NEGATIVE mg/dL
Leukocytes, UA: NEGATIVE
NITRITE: NEGATIVE
PH: 5.5 (ref 5.0–8.0)
Protein, ur: 30 mg/dL — AB
SPECIFIC GRAVITY, URINE: 1.023 (ref 1.005–1.030)

## 2016-02-20 LAB — URINE MICROSCOPIC-ADD ON
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
## 2016-02-20 NOTE — ED Notes (Signed)
2x attempt at drawing labs - unsuccessful - RN aware

## 2016-02-20 NOTE — ED Notes (Signed)
Bed: WTR8 Expected date:  Expected time:  Means of arrival:  Comments: EMS-82 y/o psych issue

## 2016-02-20 NOTE — Progress Notes (Signed)
Patient family member contacts:  Nelson,[phone]    [facility]Miller,Mary [phone]   Jackowicz,[phone]   McCleary,[phone]   Manon Hildingat (Don)                                                                    [phone]
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
## 2016-02-20 NOTE — ED Notes (Signed)
Social worker at bedside. Will obtain blood when social worker is done.

## 2016-02-20 NOTE — ED Notes (Signed)
Daughter's phone number may be: [phone].

## 2016-02-20 NOTE — ED Notes (Signed)
Pt is eating - Child psychotherapistocial worker at bedside - will update VS when finished
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
## 2016-02-20 NOTE — ED Notes (Signed)
Patient ate and drank, food at bedside.  Patient ambulatory with assistance to restroom.
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
## 2016-02-20 NOTE — ED Notes (Signed)
Patient attempting to get out of bed.  Advised patient to use call bell for assistance.  Pulled bed alarm for patient safety.
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
## 2016-02-24 ENCOUNTER — Emergency Department (HOSPITAL_COMMUNITY)
Admission: EM | Admit: 2016-02-24 | Discharge: 2016-02-24 | Disposition: A | Discharge status: Home / Self Care | Payer: Medicare Other | Attending: Emergency Medicine | Admitting: Emergency Medicine

## 2016-02-24 ENCOUNTER — Encounter (HOSPITAL_COMMUNITY): Payer: Self-pay | Admitting: *Deleted

## 2016-02-24 DIAGNOSIS — F0390 Unspecified dementia without behavioral disturbance: Secondary | ICD-10-CM | POA: Insufficient documentation

## 2016-02-24 DIAGNOSIS — R4182 Altered mental status, unspecified: Secondary | ICD-10-CM | POA: Diagnosis present

## 2016-02-24 DIAGNOSIS — Z7982 Long term (current) use of aspirin: Secondary | ICD-10-CM | POA: Diagnosis not present

## 2016-02-24 DIAGNOSIS — I10 Essential (primary) hypertension: Secondary | ICD-10-CM | POA: Diagnosis not present

## 2016-02-24 DIAGNOSIS — E119 Type 2 diabetes mellitus without complications: Secondary | ICD-10-CM | POA: Diagnosis not present

## 2016-02-24 LAB — CBC
HCT: 37.7 % (ref 36.0–46.0)
HEMOGLOBIN: 11.5 g/dL — AB (ref 12.0–15.0)
MCH: 27 pg (ref 26.0–34.0)
MCHC: 30.5 g/dL (ref 30.0–36.0)
MCV: 88.5 fL (ref 78.0–100.0)
PLATELETS: 174 10*3/uL (ref 150–400)
RBC: 4.26 MIL/uL (ref 3.87–5.11)
RDW: 15 % (ref 11.5–15.5)
WBC: 2.5 10*3/uL — AB (ref 4.0–10.5)

## 2016-02-24 LAB — COMPREHENSIVE METABOLIC PANEL
ALT: 8 U/L — ABNORMAL LOW (ref 14–54)
ANION GAP: 4 — AB (ref 5–15)
AST: 17 U/L (ref 15–41)
Albumin: 3.1 g/dL — ABNORMAL LOW (ref 3.5–5.0)
Alkaline Phosphatase: 62 U/L (ref 38–126)
BUN: 13 mg/dL (ref 6–20)
CALCIUM: 9.4 mg/dL (ref 8.9–10.3)
CHLORIDE: 106 mmol/L (ref 101–111)
CO2: 28 mmol/L (ref 22–32)
Creatinine, Ser: 0.67 mg/dL (ref 0.44–1.00)
Glucose, Bld: 99 mg/dL (ref 65–99)
Potassium: 3.9 mmol/L (ref 3.5–5.1)
SODIUM: 138 mmol/L (ref 135–145)
Total Bilirubin: 0.6 mg/dL (ref 0.3–1.2)
Total Protein: 6.6 g/dL (ref 6.5–8.1)

## 2016-02-24 LAB — CBG MONITORING, ED: GLUCOSE-CAPILLARY: 80 mg/dL (ref 65–99)

## 2016-02-24 NOTE — ED Notes (Signed)
Per family member, pt is having intermittent AMS x 4-5 months. Becoming more difficulty to care for at home. Pt denies any complaints.

## 2016-02-24 NOTE — Discharge Instructions (Signed)
Confusion Confusion is the inability to think with your usual speed or clarity. Confusion may come on quickly or slowly over time. How quickly the confusion comes on depends on the cause. Confusion can be due to any number of causes. CAUSES   Concussion, head injury, or head trauma.  Seizures.  Stroke.  Fever.  Brain tumor.  Age related decreased brain function (dementia).  Heightened emotional states like rage or terror.  Mental illness in which the person loses the ability to determine what is real and what is not (hallucinations).  Infections such as a urinary tract infection (UTI).  Toxic effects from alcohol, drugs, or prescription medicines.  Dehydration and an imbalance of salts in the body (electrolytes).  Lack of sleep.  Low blood sugar (diabetes).  Low levels of oxygen from conditions such as chronic lung disorders.  Drug interactions or other medicine side effects.  Nutritional deficiencies, especially niacin, thiamine, vitamin C, or vitamin B.  Sudden drop in body temperature (hypothermia).  Change in routine, such as when traveling or hospitalized. SIGNS AND SYMPTOMS  People often describe their thinking as cloudy or unclear when they are confused. Confusion can also include feeling disoriented. That means you are unaware of where or who you are. You may also not know what the date or time is. If confused, you may also have difficulty paying attention, remembering, and making decisions. Some people also act aggressively when they are confused.  DIAGNOSIS  The medical evaluation of confusion may include:  Blood and urine tests.  X-rays.  Brain and nervous system tests.  Analyzing your brain waves (electroencephalogram or EEG).  Magnetic resonance imaging (MRI) of your head.  Computed tomography (CT) scan of your head.  Mental status tests in which your health care provider may ask many questions. Some of these questions may seem silly or strange,  but they are a very important test to help diagnose and treat confusion. TREATMENT  An admission to the hospital may not be needed, but a person with confusion should not be left alone. Stay with a family member or friend until the confusion clears. Avoid alcohol, pain relievers, or sedative drugs until you have fully recovered. Do not drive until directed by your health care provider. HOME CARE INSTRUCTIONS  What family and friends can do:  To find out if someone is confused, ask the person to state his or her name, age, and the date. If the person is unsure or answers incorrectly, he or she is confused.  Always introduce yourself, no matter how well the person knows you.  Often remind the person of his or her location.  Place a calendar and clock near the confused person.  Help the person with his or her medicines. You may want to use a pill box, an alarm as a reminder, or give the person each dose as prescribed.  Talk about current events and plans for the day.  Try to keep the environment calm, quiet, and peaceful.  Make sure the person keeps follow-up visits with his or her health care provider. PREVENTION  Ways to prevent confusion:  Avoid alcohol.  Eat a balanced diet.  Get enough sleep.  Take medicine only as directed by your health care provider.  Do not become isolated. Spend time with other people and make plans for your days.  Keep careful watch on your blood sugar levels if you are diabetic. SEEK IMMEDIATE MEDICAL CARE IF:   You develop severe headaches, repeated vomiting, seizures, blackouts, or   slurred speech.  There is increasing confusion, weakness, numbness, restlessness, or personality changes.  You develop a loss of balance, have marked dizziness, feel uncoordinated, or fall.  You have delusions, hallucinations, or develop severe anxiety.  Your family members think you need to be rechecked.   This information is not intended to replace advice given  to you by your health care provider. Make sure you discuss any questions you have with your health care provider.   Document Released: 09/04/2004 Document Revised: 08/18/2014 Document Reviewed: 09/02/2013 Elsevier Interactive Patient Education 2016 Elsevier Inc.  
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
## 2016-03-02 ENCOUNTER — Emergency Department (HOSPITAL_COMMUNITY)
Admission: EM | Admit: 2016-03-02 | Discharge: 2016-03-02 | Disposition: A | Discharge status: Home / Self Care | Payer: Medicare Other | Attending: Emergency Medicine | Admitting: Emergency Medicine

## 2016-03-02 ENCOUNTER — Encounter (HOSPITAL_COMMUNITY): Payer: Self-pay

## 2016-03-02 DIAGNOSIS — F0390 Unspecified dementia without behavioral disturbance: Secondary | ICD-10-CM | POA: Diagnosis not present

## 2016-03-02 DIAGNOSIS — R4182 Altered mental status, unspecified: Secondary | ICD-10-CM

## 2016-03-02 DIAGNOSIS — E119 Type 2 diabetes mellitus without complications: Secondary | ICD-10-CM | POA: Diagnosis not present

## 2016-03-02 DIAGNOSIS — I10 Essential (primary) hypertension: Secondary | ICD-10-CM | POA: Insufficient documentation

## 2016-03-02 DIAGNOSIS — Z7982 Long term (current) use of aspirin: Secondary | ICD-10-CM | POA: Diagnosis not present

## 2016-03-02 LAB — COMPREHENSIVE METABOLIC PANEL
ALT: 9 U/L — AB (ref 14–54)
ANION GAP: 10 (ref 5–15)
AST: 18 U/L (ref 15–41)
Albumin: 3.5 g/dL (ref 3.5–5.0)
Alkaline Phosphatase: 61 U/L (ref 38–126)
BUN: 22 mg/dL — ABNORMAL HIGH (ref 6–20)
CALCIUM: 9.2 mg/dL (ref 8.9–10.3)
CHLORIDE: 101 mmol/L (ref 101–111)
CO2: 25 mmol/L (ref 22–32)
CREATININE: 0.8 mg/dL (ref 0.44–1.00)
Glucose, Bld: 103 mg/dL — ABNORMAL HIGH (ref 65–99)
Potassium: 3.6 mmol/L (ref 3.5–5.1)
Sodium: 136 mmol/L (ref 135–145)
Total Bilirubin: 1.5 mg/dL — ABNORMAL HIGH (ref 0.3–1.2)
Total Protein: 7.6 g/dL (ref 6.5–8.1)

## 2016-03-02 LAB — CBC
HCT: 37 % (ref 36.0–46.0)
Hemoglobin: 11.5 g/dL — ABNORMAL LOW (ref 12.0–15.0)
MCH: 27.3 pg (ref 26.0–34.0)
MCHC: 31.1 g/dL (ref 30.0–36.0)
MCV: 87.7 fL (ref 78.0–100.0)
Platelets: 192 10*3/uL (ref 150–400)
RBC: 4.22 MIL/uL (ref 3.87–5.11)
RDW: 15.1 % (ref 11.5–15.5)
WBC: 5.1 10*3/uL (ref 4.0–10.5)

## 2016-03-02 LAB — URINALYSIS, ROUTINE W REFLEX MICROSCOPIC
Glucose, UA: NEGATIVE mg/dL
Hgb urine dipstick: NEGATIVE
Ketones, ur: 15 mg/dL — AB
LEUKOCYTES UA: NEGATIVE
NITRITE: NEGATIVE
Protein, ur: 100 mg/dL — AB
SPECIFIC GRAVITY, URINE: 1.029 (ref 1.005–1.030)
pH: 5 (ref 5.0–8.0)

## 2016-03-02 LAB — URINE MICROSCOPIC-ADD ON
BACTERIA UA: NONE SEEN
RBC / HPF: NONE SEEN RBC/hpf (ref 0–5)
WBC UA: NONE SEEN WBC/hpf (ref 0–5)

## 2016-03-02 NOTE — ED Provider Notes (Signed)
[facility]-EMERGENCY DEPT Provider Note   CSN: [id number] Arrival date & time: 03/02/16  1338  First Provider Contact:  First MD Initiated Contact with Patient 03/02/16 1725        History   Chief Complaint Chief Complaint  Patient presents with  . Altered Mental Status    HPI Brandy Bailey is a 82 y.o. female.  82 year old female presents with her daughter for concern for increased confusion. Reportedly the symptoms have been ongoing and progressive over the last 4-5 months. The confusion has been intermittent but has been getting worse. Today the patient apparently could not identify her daughter and so her daughter decided to bring her in for evaluation. The patient was seen a week ago for similar complaints. At that time she was accompanied by her grandson. There have been no recent injuries or falls. No reported fevers, cough, infectious symptoms. The patient states that she doesn't know why she is here and feels fine. Patient does admit that she intermittently feels confused and is aware that this happens.    Altered Mental Status   Associated symptoms include confusion.    Past Medical History:  Diagnosis Date  . Depression   . Hypercholesteremia   . Hypertension   . Osteoarthritis   . Type II diabetes mellitus [facility])     Patient Active Problem List   Diagnosis Date Noted  . Adjustment disorder with disturbance of emotion 07/09/2015  . MDD (major depressive disorder), recurrent episode, moderate (HCC) 03/26/2015  . Protein-calorie malnutrition, severe (HCC) 12/16/2014  . Cellulitis of right foot 12/15/2014  . Cellulitis 12/15/2014  . Depression 03/10/2013  . Acute encephalopathy 02/21/2013  . History of CVA (cerebrovascular accident) 02/21/2013  . Acute blood loss anemia 10/15/2012  . Closed left hip fracture (HCC) 10/13/2012  . Syncope and collapse 10/13/2012  . Diabetes (HCC) 10/13/2012  . Hypertension 10/13/2012    Past Surgical History:  Procedure  Laterality Date  . CATARACT EXTRACTION W/ INTRAOCULAR LENS  IMPLANT, BILATERAL  1999-2004  . INTRAMEDULLARY (IM) NAIL INTERTROCHANTERIC Left 10/14/2012   Procedure: INTRAMEDULLARY (IM) NAIL INTERTROCHANTRIC;  Surgeon: Eulas Post, MD;  Location: [facility] OR;  Service: Orthopedics;  Laterality: Left;    OB History    No data available       Home Medications    Prior to Admission medications   Medication Sig Start Date End Date Taking? Authorizing Provider  aspirin 325 MG EC tablet Take 325 mg by mouth daily as needed for pain.    Historical Provider, MD    Family History Family History  Problem Relation Age of Onset  . Family history unknown: Yes    Social History Social History  Substance Use Topics  . Smoking status: Never Smoker  . Smokeless tobacco: Never Used  . Alcohol use No     Comment: Patient denies      Allergies   Celecoxib and Zoloft [sertraline hcl]   Review of Systems Review of Systems  Constitutional: Negative for appetite change, chills, fatigue and unexpected weight change.  HENT: Negative for congestion and rhinorrhea.   Eyes: Negative for visual disturbance.  Respiratory: Negative for cough and shortness of breath.   Cardiovascular: Negative for chest pain and palpitations.  Gastrointestinal: Negative for abdominal pain, diarrhea, nausea and vomiting.  Genitourinary: Negative for dysuria, frequency and urgency.  Musculoskeletal: Negative for myalgias and neck pain.  Skin: Negative for rash.  Neurological: Negative for dizziness, facial asymmetry and headaches.  Hematological: Does not bruise/bleed easily.  Psychiatric/Behavioral: Positive for confusion.     Physical Exam Updated Vital Signs BP 147/71   Pulse 63   Temp 97.8 F (36.6 C) (Oral)   Resp 19   SpO2 100%   Physical Exam  Constitutional: She is oriented to person, place, and time. She appears well-developed and well-nourished. No distress.  HENT:  Head: Normocephalic and  atraumatic.  Right Ear: External ear normal.  Left Ear: External ear normal.  Nose: Nose normal.  Mouth/Throat: Oropharynx is clear and moist. No oropharyngeal exudate.  Eyes: EOM are normal. Pupils are equal, round, and reactive to light.  Neck: Normal range of motion. Neck supple.  Cardiovascular: Normal rate, regular rhythm, normal heart sounds and intact distal pulses.   No murmur heard. Pulmonary/Chest: Effort normal. No respiratory distress. She has no wheezes. She has no rales.  Abdominal: Soft. She exhibits no distension. There is no tenderness.  Musculoskeletal: Normal range of motion. She exhibits no edema or tenderness.  Neurological: She is alert and oriented to person, place, and time. She has normal strength. No cranial nerve deficit or sensory deficit.  Patient able to give month and year, full name, location  Skin: Skin is warm and dry. No rash noted. She is not diaphoretic.  Vitals reviewed.    ED Treatments / Results  Labs (all labs ordered are listed, but only abnormal results are displayed) Labs Reviewed  COMPREHENSIVE METABOLIC PANEL - Abnormal; Notable for the following:       Result Value   Glucose, Bld 103 (*)    BUN 22 (*)    ALT 9 (*)    Total Bilirubin 1.5 (*)    All other components within normal limits  CBC - Abnormal; Notable for the following:    Hemoglobin 11.5 (*)    All other components within normal limits  URINALYSIS, ROUTINE W REFLEX MICROSCOPIC (NOT AT [facility]) - Abnormal; Notable for the following:    Color, Urine AMBER (*)    APPearance TURBID (*)    Bilirubin Urine MODERATE (*)    Ketones, ur 15 (*)    Protein, ur 100 (*)    All other components within normal limits  URINE MICROSCOPIC-ADD ON - Abnormal; Notable for the following:    Squamous Epithelial / LPF 0-5 (*)    All other components within normal limits    EKG  EKG Interpretation None       Radiology No results found.  Procedures Procedures (including critical care  time)  Medications Ordered in ED Medications - No data to display   Initial Impression / Assessment and Plan / ED Course  I have reviewed the triage vital signs and the nursing notes.  Pertinent labs & imaging results that were available during my care of the patient were reviewed by me and considered in my medical decision making (see chart for details).  Clinical Course    Patient was seen and evaluated in stable condition. Benign examination without any focal findings. History consistent with a progressive change in mental status. It appears the patient likely has dementia that is worsening. No acute finding today that medically warrants any emergent intervention. Consult loops placed to care management but they are not available today. Daughter said that she was comfortable taking the patient home. They were instructed that they need to follow-up with the primary care physician to help with any outpatient services to help in the home. Patient was discharged home in stable condition. Of note the patient was here last week  with a very similar complaint but with a different family member.  Final Clinical Impressions(s) / ED Diagnoses   Final diagnoses:  Altered mental status, unspecified altered mental status type  Dementia, without behavioral disturbance    New Prescriptions New Prescriptions   No medications on file     Leta Baptist, MD 03/02/16 1924

## 2016-03-02 NOTE — Discharge Instructions (Signed)
You were seen and evaluated today for your progressive confusion. It appears this is likely some component of dementia but this is not a diagnosis that can be made in the emergency department. You need to follow-up with your primary care physician. Care management was not available today. I did place a consult so they may reach out to tomorrow. We were not able to help facilitate with any outpatient resources at this time. You need to follow-up with the primary care physician for help with home health aides and other home health help.

## 2016-03-02 NOTE — ED Triage Notes (Addendum)
Per Daughter,  Pt is coming from home. Family reports increase of Altered Mental Status for the past three weeks. Family reports they set mother up with nursing facility and she walked out. She was found walking down the highway by [employer]. Pt has increased fatigue, slurred speech, and confusion for the past three weeks. Hx of Dementia. Pt reports, "I just want to die. I want to go to sleep and not wake up."

## 2016-03-03 ENCOUNTER — Telehealth: Payer: Self-pay | Admitting: *Deleted

## 2016-03-13 ENCOUNTER — Encounter (HOSPITAL_COMMUNITY): Payer: Self-pay | Admitting: *Deleted

## 2016-03-13 ENCOUNTER — Emergency Department (HOSPITAL_COMMUNITY)
Admission: EM | Admit: 2016-03-13 | Discharge: 2016-03-13 | Disposition: A | Discharge status: Home / Self Care | Payer: Medicare Other | Attending: Dermatology | Admitting: Dermatology

## 2016-03-13 DIAGNOSIS — R4182 Altered mental status, unspecified: Secondary | ICD-10-CM | POA: Insufficient documentation

## 2016-03-13 DIAGNOSIS — Z5321 Procedure and treatment not carried out due to patient leaving prior to being seen by health care provider: Secondary | ICD-10-CM | POA: Diagnosis not present

## 2016-03-13 DIAGNOSIS — I10 Essential (primary) hypertension: Secondary | ICD-10-CM | POA: Insufficient documentation

## 2016-03-13 DIAGNOSIS — E119 Type 2 diabetes mellitus without complications: Secondary | ICD-10-CM | POA: Diagnosis not present

## 2016-03-13 DIAGNOSIS — Z7982 Long term (current) use of aspirin: Secondary | ICD-10-CM | POA: Diagnosis not present

## 2016-03-13 LAB — CBC
HEMATOCRIT: 36.5 % (ref 36.0–46.0)
Hemoglobin: 11.1 g/dL — ABNORMAL LOW (ref 12.0–15.0)
MCH: 27.2 pg (ref 26.0–34.0)
MCHC: 30.4 g/dL (ref 30.0–36.0)
MCV: 89.5 fL (ref 78.0–100.0)
Platelets: 164 10*3/uL (ref 150–400)
RBC: 4.08 MIL/uL (ref 3.87–5.11)
RDW: 15.2 % (ref 11.5–15.5)
WBC: 3.8 10*3/uL — ABNORMAL LOW (ref 4.0–10.5)

## 2016-03-13 LAB — COMPREHENSIVE METABOLIC PANEL
ALBUMIN: 3.3 g/dL — AB (ref 3.5–5.0)
ALT: 8 U/L — ABNORMAL LOW (ref 14–54)
AST: 18 U/L (ref 15–41)
Alkaline Phosphatase: 57 U/L (ref 38–126)
Anion gap: 8 (ref 5–15)
BILIRUBIN TOTAL: 0.4 mg/dL (ref 0.3–1.2)
BUN: 22 mg/dL — AB (ref 6–20)
CO2: 25 mmol/L (ref 22–32)
Calcium: 8.9 mg/dL (ref 8.9–10.3)
Chloride: 101 mmol/L (ref 101–111)
Creatinine, Ser: 0.78 mg/dL (ref 0.44–1.00)
GFR calc Af Amer: >60 mL/min (ref >60)
GFR calc non Af Amer: >60 mL/min (ref >60)
GLUCOSE: 83 mg/dL (ref 65–99)
POTASSIUM: 3.9 mmol/L (ref 3.5–5.1)
Sodium: 134 mmol/L — ABNORMAL LOW (ref 135–145)
TOTAL PROTEIN: 7.1 g/dL (ref 6.5–8.1)

## 2016-03-13 NOTE — ED Notes (Signed)
Patient left without being seen.

## 2016-03-13 NOTE — ED Triage Notes (Signed)
Pt and family member reports pt being altered for extended amount of time, has been getting progressively worse but seems more severe today and pt requested to be brought for eval. No acute distress noted at triage.

## 2016-03-15 ENCOUNTER — Emergency Department (HOSPITAL_COMMUNITY)
Admission: EM | Admit: 2016-03-15 | Discharge: 2016-03-16 | Disposition: A | Discharge status: Home / Self Care | Payer: Medicare Other | Attending: Emergency Medicine | Admitting: Emergency Medicine

## 2016-03-15 ENCOUNTER — Encounter (HOSPITAL_COMMUNITY): Payer: Self-pay

## 2016-03-15 DIAGNOSIS — Y939 Activity, unspecified: Secondary | ICD-10-CM | POA: Diagnosis not present

## 2016-03-15 DIAGNOSIS — W19XXXA Unspecified fall, initial encounter: Secondary | ICD-10-CM

## 2016-03-15 DIAGNOSIS — Y999 Unspecified external cause status: Secondary | ICD-10-CM | POA: Insufficient documentation

## 2016-03-15 DIAGNOSIS — E119 Type 2 diabetes mellitus without complications: Secondary | ICD-10-CM | POA: Diagnosis not present

## 2016-03-15 DIAGNOSIS — I10 Essential (primary) hypertension: Secondary | ICD-10-CM | POA: Insufficient documentation

## 2016-03-15 DIAGNOSIS — W1789XA Other fall from one level to another, initial encounter: Secondary | ICD-10-CM | POA: Insufficient documentation

## 2016-03-15 DIAGNOSIS — Z8673 Personal history of transient ischemic attack (TIA), and cerebral infarction without residual deficits: Secondary | ICD-10-CM | POA: Diagnosis not present

## 2016-03-15 DIAGNOSIS — S51812A Laceration without foreign body of left forearm, initial encounter: Secondary | ICD-10-CM

## 2016-03-15 DIAGNOSIS — Y92481 Parking lot as the place of occurrence of the external cause: Secondary | ICD-10-CM | POA: Diagnosis not present

## 2016-03-15 DIAGNOSIS — Z7982 Long term (current) use of aspirin: Secondary | ICD-10-CM | POA: Insufficient documentation

## 2016-03-15 LAB — BASIC METABOLIC PANEL
ANION GAP: 5 (ref 5–15)
BUN: 12 mg/dL (ref 6–20)
CALCIUM: 9.1 mg/dL (ref 8.9–10.3)
CHLORIDE: 105 mmol/L (ref 101–111)
CO2: 29 mmol/L (ref 22–32)
Creatinine, Ser: 0.65 mg/dL (ref 0.44–1.00)
GFR calc non Af Amer: >60 mL/min (ref >60)
Glucose, Bld: 142 mg/dL — ABNORMAL HIGH (ref 65–99)
POTASSIUM: 3.5 mmol/L (ref 3.5–5.1)
Sodium: 139 mmol/L (ref 135–145)

## 2016-03-15 LAB — CBC WITH DIFFERENTIAL/PLATELET
BASOS PCT: 0 %
Basophils Absolute: 0 10*3/uL (ref 0.0–0.1)
Eosinophils Absolute: 0.1 10*3/uL (ref 0.0–0.7)
Eosinophils Relative: 2 %
HEMATOCRIT: 35.5 % — AB (ref 36.0–46.0)
HEMOGLOBIN: 10.6 g/dL — AB (ref 12.0–15.0)
LYMPHS PCT: 19 %
Lymphs Abs: 0.5 10*3/uL — ABNORMAL LOW (ref 0.7–4.0)
MCH: 26.5 pg (ref 26.0–34.0)
MCHC: 29.9 g/dL — AB (ref 30.0–36.0)
MCV: 88.8 fL (ref 78.0–100.0)
MONOS PCT: 11 %
Monocytes Absolute: 0.3 10*3/uL (ref 0.1–1.0)
NEUTROS ABS: 1.8 10*3/uL (ref 1.7–7.7)
NEUTROS PCT: 68 %
Platelets: 168 10*3/uL (ref 150–400)
RBC: 4 MIL/uL (ref 3.87–5.11)
RDW: 14.8 % (ref 11.5–15.5)
WBC: 2.7 10*3/uL — ABNORMAL LOW (ref 4.0–10.5)

## 2016-03-15 NOTE — ED Triage Notes (Addendum)
Pt here for evaluation of left arm wound.  Bleeding controlled.  Pt reports falling today but doesn't know why or how self got injured.  Bruise noted to left cheek bone.

## 2016-03-15 NOTE — Progress Notes (Signed)
CSW has contacted APS and granddaughters again. No returned call from APS or from family at this time.    Ernestina Columbia, LCSWA [facility] Clinical Social Worker [phone]

## 2016-03-15 NOTE — ED Notes (Signed)
Pt wanting to leave AMA.  Pt is confused and does not have a drivers license.  Dr. Rosalia Hammers agrees pt should get evaluated and then [employer] will take pt home.  This nurse is trying to get in touch with granddaughters.
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
## 2016-03-15 NOTE — Progress Notes (Signed)
CSW has contacted [facility] and is awaiting a call back.    Ernestina Columbia, LCSWA [facility] Clinical Social Worker [phone]

## 2016-03-15 NOTE — ED Notes (Signed)
Spoke with Care Management who advised Social Work is involved with this case.

## 2016-03-15 NOTE — ED Notes (Signed)
Patient is resting comfortably. 

## 2016-03-15 NOTE — Progress Notes (Signed)
CSW has contacted the pt granddaughter Dale Caguas at [phone] and a family member listed by name of Dennie Bible at [phone]. CSW unable to receive  answer, but did leave a voice message for the family member Dennie Bible to give her a call back.    Ernestina Columbia, LCSWA [facility] Clinical Social Worker [phone]

## 2016-03-15 NOTE — ED Notes (Signed)
Social worker called and advised that she has been trying to call emergency contact numbers for pt.  This nurse advised social worker that no contact has been made with the  three granddaughters numbers from the patients daughters file.   Debby Bud [phone], Dale Waverly [phone], Prescott Parma [phone].

## 2016-03-15 NOTE — Progress Notes (Signed)
CSW has contacted pt granddaughter Ms. Miller at [phone] and left a voice message to return CSW call.  Ernestina Columbia, LCSWA [facility] Clinical Social Worker [phone]

## 2016-03-15 NOTE — ED Provider Notes (Signed)
82 y.o. Female brought in by daughter with complaints of fall with arm laceration.  Daughter now not here and patient wants to leave.  However, patient confused and does not remember how she got here.  She also reportedly does not have a driver's license and cannot drive. Nursing advised that patient not competent to leave AMA.    Margarita Grizzle, MD 03/15/16 [phone]

## 2016-03-16 NOTE — Discharge Instructions (Signed)
Local wound care with bacitracin and dressing changes twice daily.  Return to the emergency department for increased redness, pus draining from the wound, streaking up or down the arm, or other new and concerning symptoms.

## 2016-03-16 NOTE — ED Notes (Signed)
Patient is resting comfortably with no complaints.

## 2016-03-16 NOTE — ED Notes (Signed)
Pt bathed by this RN and Garment/textile technologistMarisela RN, hair washed and her clothing was changed. Pt and pts clothing smelled of urine prior to bath.

## 2016-03-16 NOTE — Progress Notes (Signed)
CSW spoke with pt grandson Brandy Bailey at [phone].CSW informed pt grandson that CSW has been trying to get into contact with a reliable family member to pick the pt up and take her into a safe environment. Pt has spoken to grandson, who informed pt that he is willing to drive down from [place] and bring her back to stay with him for a few days until a plan in place. Pt has refused to go with grandson as well as to go with her other granddaughter Brandy Bailey, states she wants to go home. CSW was informed by the grandson that he has contacted his sister and grandmother to see if they can come to the hospital, which the granddaughter has been making arrangements. Grandson stated that he will continue to try to get into contact with his sister and other grandmother. CSW is awaiting a call back from the grandson. CSW was informed by APS that they will return her call once arriving back into the office today.    Brandy Bailey, Brandy Bailey Clinical Social Worker [phone]

## 2016-03-16 NOTE — ED Notes (Signed)
Patient given coffee. 

## 2016-03-16 NOTE — Progress Notes (Signed)
CSW has contacted APS again this morning and is awaiting a call back from the [employer] Social Worker.    Brandy Bailey, LCSWA [facility] Clinical Social Worker [phone]

## 2016-03-16 NOTE — Progress Notes (Signed)
Pt granddaughters have not arrived. CSW has contacted pt granddaughter Brandy Bailey again, but no answer. CSW has also contacted APS again and is currently awaiting feedback.    Brandy Bailey, LCSWA [facility] [place] Clinical Social Worker [phone]

## 2016-03-16 NOTE — Progress Notes (Addendum)
CSW contacted the family member listed as Dennie Bibleat at [phone] and received a contact number for the pt granddaughter. CSW was informed by Dennie BiblePat that she is not related to the pt, but share a granddaughter with the pt. CSW contacted pt granddaughter Brandy Bailey at [phone] and left a voice message. CSW is awaiting a call back.     Brandy Bailey, LCSWA [facility] Clinical Social Worker [phone]
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
## 2016-03-16 NOTE — ED Notes (Signed)
Pt discharged to home with her daughter, Consulting civil engineerCharge RN gave taxi voucher for them to get home in.

## 2016-03-16 NOTE — ED Notes (Signed)
pts granddaughter Toni AmendCourtney called this RN and states that she is on her way to pick up the pt. SW updated.
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
## 2016-03-16 NOTE — Progress Notes (Signed)
CSW has contacted pt granddaughter Henreitta CeaMckenna McClearly at [phone] and a Mr.Cody listed at [phone]. CSW was unable to leave voice message.     Ernestina Columbiayiesha Studer, LCSWA [facility]  Clinical Social Worker [phone]

## 2016-03-16 NOTE — Progress Notes (Signed)
CSW spoke with pt granddaughter Dale DurhamCourtney Nelson at [phone]. Pt granddaughter is working on making arrangements to find someone to watch her kids and come to the hospital. Pt granddaughter also informed CSW that she would call her brother to see if he could come as well.    Ernestina Columbiayiesha Flitton, LCSWA [facility] Clinical Social Worker [phone]

## 2016-03-16 NOTE — Progress Notes (Signed)
CSW spoke with the pt granddaughter Brandy Bailey at [phone] this morning. CSW informed the pt granddaughter that her grandmother was brought to the ED yesterday and CSW has been trying to contact family. Granddaughter stated that she would be able to come to the hospital. CSW has not received a call back from the pt other granddaughter.     Brandy Bailey, LCSWA [facility] Clinical Social Worker [phone]

## 2016-03-16 NOTE — ED Notes (Signed)
Patient advised by SW patient grandson willing to come get her. Patient advised she is unable to drive home.

## 2016-03-16 NOTE — Progress Notes (Signed)
CSW has made APS report with On-Call Social Worker Wes at [phone]. CSW was informed by [employer] Social Worker that report will be referred to APS Supervisor to make a decision. CSW has also contacted grandson and granddaughter again. CSW left voice messages.  Brandy Bailey, LCSWA [facility] Clinical Social Worker [phone]

## 2016-03-16 NOTE — ED Notes (Signed)
Walked the patient to the restroom without any complications.  Also set her breakfast tray up for her and she is able to eat on her own.

## 2016-03-26 ENCOUNTER — Emergency Department (HOSPITAL_COMMUNITY)
Admission: EM | Admit: 2016-03-26 | Discharge: 2016-03-27 | Disposition: A | Discharge status: Home / Self Care | Payer: Medicare Other | Attending: Emergency Medicine | Admitting: Emergency Medicine

## 2016-03-26 ENCOUNTER — Encounter (HOSPITAL_COMMUNITY): Payer: Self-pay | Admitting: Emergency Medicine

## 2016-03-26 DIAGNOSIS — E119 Type 2 diabetes mellitus without complications: Secondary | ICD-10-CM | POA: Diagnosis not present

## 2016-03-26 DIAGNOSIS — R4182 Altered mental status, unspecified: Secondary | ICD-10-CM | POA: Diagnosis present

## 2016-03-26 DIAGNOSIS — Z7982 Long term (current) use of aspirin: Secondary | ICD-10-CM | POA: Diagnosis not present

## 2016-03-26 DIAGNOSIS — F0390 Unspecified dementia without behavioral disturbance: Secondary | ICD-10-CM | POA: Insufficient documentation

## 2016-03-26 DIAGNOSIS — I10 Essential (primary) hypertension: Secondary | ICD-10-CM | POA: Insufficient documentation

## 2016-03-26 HISTORY — DX: Unspecified dementia, unspecified severity, without behavioral disturbance, psychotic disturbance, mood disturbance, and anxiety: F03.90

## 2016-03-26 NOTE — ED Provider Notes (Signed)
[facility]-EMERGENCY DEPT Provider Note   CSN: [id number] Arrival date & time: 03/26/16  2010  By signing my name below, I, Jasmyn B. Alexander, attest that this documentation has been prepared under the direction and in the presence of Paula LibraJohn Soniya Ashraf, MD. Electronically Signed: Gillis EndsJasmyn B. Lyn HollingsheadAlexander, ED Scribe. 03/26/16. 11:59 PM.  History   Chief Complaint Chief Complaint  Patient presents with  . Fatigue   LEVEL V CAVEAT - Dementia  HPI Brandy Bailey is a 82 y.o. female brought in by ambulance, with PMHx of dementia, HTN, DM, and depression who was sent to the ED by her daughter for reported altered mental status x 2 days. The daughter did not accompany her. Staff familiar with the patient notes no acute changes and reports that the patient has been sent here for the same thing in the past. Social work has been involved on multiple occasions.  Per EMS she has had decreased talking and activity and was evaluated for a similar complaint 2 days ago (location not given and no notes in Epic for that day) which daughter believes has worsened. However, pt denies any complaints at this time.   Past Medical History:  Diagnosis Date  . Dementia   . Depression   . Hypercholesteremia   . Hypertension   . Osteoarthritis   . Type II diabetes mellitus [facility](HCC)     Patient Active Problem List   Diagnosis Date Noted  . Adjustment disorder with disturbance of emotion 07/09/2015  . MDD (major depressive disorder), recurrent episode, moderate (HCC) 03/26/2015  . Protein-calorie malnutrition, severe (HCC) 12/16/2014  . Cellulitis of right foot 12/15/2014  . Cellulitis 12/15/2014  . Depression 03/10/2013  . Acute encephalopathy 02/21/2013  . History of CVA (cerebrovascular accident) 02/21/2013  . Acute blood loss anemia 10/15/2012  . Closed left hip fracture (HCC) 10/13/2012  . Syncope and collapse 10/13/2012  . Diabetes (HCC) 10/13/2012  . Hypertension 10/13/2012    Past Surgical History:    Procedure Laterality Date  . CATARACT EXTRACTION W/ INTRAOCULAR LENS  IMPLANT, BILATERAL  1999-2004  . INTRAMEDULLARY (IM) NAIL INTERTROCHANTERIC Left 10/14/2012   Procedure: INTRAMEDULLARY (IM) NAIL INTERTROCHANTRIC;  Surgeon: Eulas PostJoshua P Landau, MD;  Location: [facility] OR;  Service: Orthopedics;  Laterality: Left;    OB History    No data available     Home Medications    Prior to Admission medications   Medication Sig Start Date End Date Taking? Authorizing Provider  aspirin 325 MG EC tablet Take 325 mg by mouth daily as needed for pain.   Yes Historical Provider, MD    Family History Family History  Problem Relation Age of Onset  . Family history unknown: Yes    Social History Social History  Substance Use Topics  . Smoking status: Never Smoker  . Smokeless tobacco: Never Used  . Alcohol use No     Comment: Patient denies     Allergies   Celecoxib and Zoloft [sertraline hcl]   Review of Systems Review of Systems  Unable to perform ROS: Dementia      Physical Exam Updated Vital Signs BP 128/61   Pulse 67   Temp 97.8 F (36.6 C) (Oral)   Resp 22   SpO2 99%   Physical Exam General: Well-developed, mildly cachectic female in no acute distress; appearance consistent with age of record HENT: normocephalic; atraumatic; edentulous Eyes: pupils pinpoint; extraocular muscles intact; lens implants Neck: supple Heart: regular rate and rhythm; frequent PACs Lungs: clear to auscultation bilaterally Abdomen:  soft; nondistended; nontender; no masses or hepatosplenomegaly; bowel sounds present Extremities: No deformity; full range of motion; pulses normal Neurologic: Awake, alert and oriented 2; motor function intact in all extremities and symmetric; no facial droop Skin: Warm and dry Psychiatric: Normal mood and affect  ED Treatments / Results   Nursing notes and vitals signs, including pulse oximetry, reviewed.  Summary of this visit's results, reviewed by  myself:  Labs:  Results for orders placed or performed during the hospital encounter of 03/26/16 (from the past 24 hour(s))  CBC with Differential/Platelet     Status: Abnormal   Collection Time: 03/27/16 12:19 AM  Result Value Ref Range   WBC 3.1 (L) 4.0 - 10.5 K/uL   RBC 4.11 3.87 - 5.11 MIL/uL   Hemoglobin 11.1 (L) 12.0 - 15.0 g/dL   HCT 16.135.9 (L) 09.636.0 - 04.546.0 %   MCV 87.3 78.0 - 100.0 fL   MCH 27.0 26.0 - 34.0 pg   MCHC 30.9 30.0 - 36.0 g/dL   RDW 40.915.3 81.111.5 - 91.415.5 %   Platelets 186 150 - 400 K/uL   Neutrophils Relative % 60 %   Neutro Abs 1.8 1.7 - 7.7 K/uL   Lymphocytes Relative 28 %   Lymphs Abs 0.9 0.7 - 4.0 K/uL   Monocytes Relative 8 %   Monocytes Absolute 0.3 0.1 - 1.0 K/uL   Eosinophils Relative 3 %   Eosinophils Absolute 0.1 0.0 - 0.7 K/uL   Basophils Relative 1 %   Basophils Absolute 0.0 0.0 - 0.1 K/uL  I-stat chem 8, ed     Status: Abnormal   Collection Time: 03/27/16 12:28 AM  Result Value Ref Range   Sodium 138 135 - 145 mmol/L   Potassium 3.6 3.5 - 5.1 mmol/L   Chloride 99 (L) 101 - 111 mmol/L   BUN 19 6 - 20 mg/dL   Creatinine, Ser 7.820.80 0.44 - 1.00 mg/dL   Glucose, Bld 93 65 - 99 mg/dL   Calcium, Ion 9.561.17 2.131.12 - 1.23 mmol/L   TCO2 27 0 - 100 mmol/L   Hemoglobin 11.2 (L) 12.0 - 15.0 g/dL   HCT 08.633.0 (L) 57.836.0 - 46.946.0 %  Urinalysis, Routine w reflex microscopic ([facility])     Status: Abnormal   Collection Time: 03/27/16  1:58 AM  Result Value Ref Range   Color, Urine YELLOW YELLOW   APPearance CLEAR CLEAR   Specific Gravity, Urine 1.021 1.005 - 1.030   pH 6.5 5.0 - 8.0   Glucose, UA NEGATIVE NEGATIVE mg/dL   Hgb urine dipstick NEGATIVE NEGATIVE   Bilirubin Urine NEGATIVE NEGATIVE   Ketones, ur NEGATIVE NEGATIVE mg/dL   Protein, ur 30 (A) NEGATIVE mg/dL   Nitrite NEGATIVE NEGATIVE   Leukocytes, UA TRACE (A) NEGATIVE  Urine microscopic-add on     Status: Abnormal   Collection Time: 03/27/16  1:58 AM  Result Value Ref Range   Squamous Epithelial /  LPF 6-30 (A) NONE SEEN   WBC, UA 0-5 0 - 5 WBC/hpf   RBC / HPF 0-5 0 - 5 RBC/hpf   Bacteria, UA RARE (A) NONE SEEN   Casts HYALINE CASTS (A) NEGATIVE    Imaging Studies: No results found.   EKG Interpretation  Date/Time:  Wednesday March 26 2016 20:30:23 EDT Ventricular Rate:  62 PR Interval:    QRS Duration: 84 QT Interval:  414 QTC Calculation: 421 R Axis:   48 Text Interpretation:  Sinus rhythm Atrial premature complex Anteroseptal infarct, old Minimal  ST depression, lateral leads No significant change since last tracing Confirmed by Ethelda Chick  MD, SAM [phone]) on 03/26/2016 9:53:39 PM       Procedures (including critical care time)  Final Clinical Impressions(s) / ED Diagnoses   Final diagnoses:  Dementia, without behavioral disturbance   I personally performed the services described in this documentation, which was scribed in my presence. The recorded information has been reviewed and is accurate.    Paula Libra, MD 03/27/16 [phone]

## 2016-03-26 NOTE — ED Notes (Signed)
Bed: [id number] Expected date:  Expected time:  Means of arrival:  Comments: EMS elderly/weakness

## 2016-03-26 NOTE — Progress Notes (Signed)
Patient noted to have been seen in the ED 15 times within the last six months.   EDCM spoke to patient at bedside. Patient reports she lives at home with her daughter. She reports, "I'm tired of living, and I can't die.  I'm just in the way."Patient reports she does not know why she is in the ED today. Patient reports her pcp is Dr. Pete GlatterStoneking.  She reports she hasn't seen him in a while.   She reports she takes herself to her appointments in the car, but she doesn't have the car anymore. Per chart review an APS report has been filed regarding this patient. No further EDCM needs at this time.

## 2016-03-26 NOTE — ED Notes (Addendum)
Pt ambulated to restroom with assistance.  

## 2016-03-26 NOTE — ED Triage Notes (Addendum)
Pt BIB EMS from home; daughter was concerned because pt had decreased talking and activity; pt was evaluated 2 days ago for same but daughter states it has worsened since then; hx of dementia; alert to self and that she is at the hospital; denies pain.

## 2016-03-27 ENCOUNTER — Encounter (HOSPITAL_COMMUNITY): Payer: Self-pay | Admitting: Emergency Medicine

## 2016-03-27 DIAGNOSIS — R4182 Altered mental status, unspecified: Secondary | ICD-10-CM | POA: Insufficient documentation

## 2016-03-27 DIAGNOSIS — Z5321 Procedure and treatment not carried out due to patient leaving prior to being seen by health care provider: Secondary | ICD-10-CM

## 2016-03-27 LAB — I-STAT CHEM 8, ED
BUN: 19 mg/dL (ref 6–20)
CHLORIDE: 99 mmol/L — AB (ref 101–111)
CREATININE: 0.8 mg/dL (ref 0.44–1.00)
Calcium, Ion: 1.17 mmol/L (ref 1.12–1.23)
GLUCOSE: 93 mg/dL (ref 65–99)
HEMATOCRIT: 33 % — AB (ref 36.0–46.0)
HEMOGLOBIN: 11.2 g/dL — AB (ref 12.0–15.0)
POTASSIUM: 3.6 mmol/L (ref 3.5–5.1)
Sodium: 138 mmol/L (ref 135–145)
TCO2: 27 mmol/L (ref 0–100)

## 2016-03-27 LAB — URINE MICROSCOPIC-ADD ON: RBC / HPF: NONE SEEN RBC/hpf (ref 0–5)

## 2016-03-27 LAB — CBC WITH DIFFERENTIAL/PLATELET
BASOS ABS: 0 10*3/uL (ref 0.0–0.1)
BASOS PCT: 1 %
BASOS PCT: 1 %
Basophils Absolute: 0 10*3/uL (ref 0.0–0.1)
EOS ABS: 0.1 10*3/uL (ref 0.0–0.7)
EOS PCT: 2 %
Eosinophils Absolute: 0.1 10*3/uL (ref 0.0–0.7)
Eosinophils Relative: 3 %
HCT: 36.1 % (ref 36.0–46.0)
HEMATOCRIT: 35.9 % — AB (ref 36.0–46.0)
HEMOGLOBIN: 11.1 g/dL — AB (ref 12.0–15.0)
Hemoglobin: 10.8 g/dL — ABNORMAL LOW (ref 12.0–15.0)
LYMPHS ABS: 0.8 10*3/uL (ref 0.7–4.0)
LYMPHS PCT: 28 %
Lymphocytes Relative: 24 %
Lymphs Abs: 0.9 10*3/uL (ref 0.7–4.0)
MCH: 26.7 pg (ref 26.0–34.0)
MCH: 27 pg (ref 26.0–34.0)
MCHC: 29.9 g/dL — AB (ref 30.0–36.0)
MCHC: 30.9 g/dL (ref 30.0–36.0)
MCV: 87.3 fL (ref 78.0–100.0)
MCV: 89.4 fL (ref 78.0–100.0)
MONOS PCT: 9 %
Monocytes Absolute: 0.3 10*3/uL (ref 0.1–1.0)
Monocytes Absolute: 0.3 10*3/uL (ref 0.1–1.0)
Monocytes Relative: 8 %
NEUTROS ABS: 1.8 10*3/uL (ref 1.7–7.7)
NEUTROS PCT: 60 %
NEUTROS PCT: 64 %
Neutro Abs: 2.3 10*3/uL (ref 1.7–7.7)
PLATELETS: 190 10*3/uL (ref 150–400)
Platelets: 186 10*3/uL (ref 150–400)
RBC: 4.04 MIL/uL (ref 3.87–5.11)
RBC: 4.11 MIL/uL (ref 3.87–5.11)
RDW: 15 % (ref 11.5–15.5)
RDW: 15.3 % (ref 11.5–15.5)
WBC: 3.1 10*3/uL — ABNORMAL LOW (ref 4.0–10.5)
WBC: 3.6 10*3/uL — AB (ref 4.0–10.5)

## 2016-03-27 LAB — COMPREHENSIVE METABOLIC PANEL
ALBUMIN: 3.2 g/dL — AB (ref 3.5–5.0)
ALT: 8 U/L — ABNORMAL LOW (ref 14–54)
ANION GAP: 3 — AB (ref 5–15)
AST: 15 U/L (ref 15–41)
Alkaline Phosphatase: 65 U/L (ref 38–126)
BUN: 17 mg/dL (ref 6–20)
CALCIUM: 8.9 mg/dL (ref 8.9–10.3)
CHLORIDE: 105 mmol/L (ref 101–111)
CO2: 30 mmol/L (ref 22–32)
Creatinine, Ser: 0.82 mg/dL (ref 0.44–1.00)
GFR calc non Af Amer: >60 mL/min (ref >60)
Glucose, Bld: 96 mg/dL (ref 65–99)
POTASSIUM: 3.9 mmol/L (ref 3.5–5.1)
SODIUM: 138 mmol/L (ref 135–145)
Total Bilirubin: 0.6 mg/dL (ref 0.3–1.2)
Total Protein: 6.6 g/dL (ref 6.5–8.1)

## 2016-03-27 LAB — URINALYSIS, ROUTINE W REFLEX MICROSCOPIC
Bilirubin Urine: NEGATIVE
GLUCOSE, UA: NEGATIVE mg/dL
Glucose, UA: NEGATIVE mg/dL
Hgb urine dipstick: NEGATIVE
Hgb urine dipstick: NEGATIVE
KETONES UR: NEGATIVE mg/dL
KETONES UR: NEGATIVE mg/dL
LEUKOCYTES UA: NEGATIVE
NITRITE: NEGATIVE
NITRITE: NEGATIVE
PH: 5.5 (ref 5.0–8.0)
PROTEIN: 30 mg/dL — AB
Protein, ur: 30 mg/dL — AB
SPECIFIC GRAVITY, URINE: 1.024 (ref 1.005–1.030)
Specific Gravity, Urine: 1.021 (ref 1.005–1.030)
pH: 6.5 (ref 5.0–8.0)

## 2016-03-27 NOTE — Progress Notes (Signed)
ED CM [facility]  spoke with ED charge RN , Johnny BridgeMartha, ED SW Tacey HeapLavonia and [facility] Earlene PlaterWallace with attempts to assist with locating pt's daughter whom CM spoke with earlier on 03/27/16 about resource for this pt to include home health , private duty nursing , choice connections and [facility] [place] [facility]  Discussed them in detail Daughter had stated pt was previously resistant to home health "through the social services" Cm encourage pt's daughter to develop a relation ship with pt's DSS case worker to better assess a better agency or program for the pt  pt's daughter agreed she would do so. Discussed the ones important for pt's daughter  to discuss with pt's pcp - [facility],  in order for pt's daughter  to develop a "familiar" "consistent" plan of care for the pt that would also allow pt's daughter to get respite time. Pt smelled strongly of urine Discussed with ED SW that pt not eligible for home health if she is ambulatory, not home bound and able to complete ADLs This pt has PMH dementia as noted in response to questions about today's events, day, daughter name, time of day as she stood at ED nursing station

## 2016-03-27 NOTE — Care Management (Addendum)
ED CM spoke with [facility] Triage RN regarding patient with hx of dementia who was discharged less than 4 hours ago from [facility], and brought to [facility] ED. [facility] met with patient in [facility] ED lobby, patient states she wants to go home, threatening to walk home tonight. Asked about getting into home she confirmed being able to obtain entrance.  CM attempted to contact grand daughter Candie Echevaria by phone unsuccessful.  ED CM offered assistance with getting patient home safely. [employer] taxi called. Patient assisted into the taxi,

## 2016-03-27 NOTE — ED Notes (Signed)
Notified [employer] for home check.  Reported pt dtr is coming for patient by [employer].  Notification was at 12:45.  Pt family has not arrived.  Social worker involved for APS consult.  APS has been notified several times regarding patient.  Pt is ambulatory to desk and requesting to be d/c.  Attempting family contact for notification that home door unlocked for patient entry.

## 2016-03-27 NOTE — ED Notes (Signed)
Patient states she can get in her house and was given a taxi voucher and helpped into the cab. Patient alert and oriented

## 2016-03-27 NOTE — ED Notes (Signed)
Per charge report, pt still in room. D/C but pending ride from daughter.  Daughter to come get pat this morning.

## 2016-03-27 NOTE — Progress Notes (Signed)
Taxi voucher was given to charge nurse for transportation.   Stacy GardnerErin Jediah Horger, LCSWA Clinical Social Worker [phone]

## 2016-03-27 NOTE — ED Notes (Signed)
[employer] requested by staff for home check to reach daughter.

## 2016-03-27 NOTE — ED Triage Notes (Signed)
Pt. arrived with EMS from home , daughter reported worsening dementia with  confusion /disorientation for the past several days , pt. alert at arrival but disoriented to time . Denies pain / no SOB or fever .

## 2016-03-27 NOTE — Progress Notes (Addendum)
CSW was consulted by Charge RN regarding patient needing transportation to home and possible APS report being called to DSS. CSW also spoke with ED CM and [employer] Officer Earlene PlaterWallace regarding patient. CSW was informed by ED CM that patient's daughter was in the ED prior on today. Officer Earlene PlaterWallace stated an Technical sales engineerofficer had been to the home to see if someone was there, however, he stated no one was home at the time of the visit.   CSW spoke with Jennette Kettleendra Marcus, [facility] [employer] [facility] Social Worker to make a report. CSW provided patient's name and the social worker stated she was aware of patient, as she states they have received a number of reports on this patient. She stated after staffing with her supervisor based on information provided, an APS was not warranted at this time. She stated she would complete a referral to the [employer] Social Worker at [employer] to offer patient resources. Charge RN updated.  CSW staffed with ED CM to see if patient would be eligible for home health or respite services. She stated patient would not be eligible for these services. CSW spoke with Jennette Kettleendra Marcus, [facility] to inform her that patient would not be eligible for the services.    Call received from Charge RN who states patient is able and capable to go home by taxi. 2nd Shift CSW updated and will give voucher to Charge RN.   Elenore PaddyLaVonia Audria Takeshita, LCSWA [phone] ED CSW 03/27/2016 3:40 PM

## 2016-03-28 ENCOUNTER — Emergency Department (HOSPITAL_COMMUNITY)
Admission: EM | Admit: 2016-03-28 | Discharge: 2016-03-28 | Disposition: A | Discharge status: Home / Self Care | Payer: Medicare Other | Source: Home / Self Care

## 2016-03-29 ENCOUNTER — Emergency Department (HOSPITAL_COMMUNITY): Payer: Medicare Other

## 2016-03-29 ENCOUNTER — Emergency Department (HOSPITAL_COMMUNITY)
Admission: EM | Admit: 2016-03-29 | Discharge: 2016-03-29 | Disposition: A | Discharge status: Home / Self Care | Payer: Medicare Other | Attending: Emergency Medicine | Admitting: Emergency Medicine

## 2016-03-29 ENCOUNTER — Encounter (HOSPITAL_COMMUNITY): Payer: Self-pay | Admitting: Emergency Medicine

## 2016-03-29 DIAGNOSIS — R55 Syncope and collapse: Secondary | ICD-10-CM | POA: Diagnosis present

## 2016-03-29 DIAGNOSIS — R531 Weakness: Secondary | ICD-10-CM

## 2016-03-29 DIAGNOSIS — Z7982 Long term (current) use of aspirin: Secondary | ICD-10-CM | POA: Diagnosis not present

## 2016-03-29 DIAGNOSIS — I10 Essential (primary) hypertension: Secondary | ICD-10-CM | POA: Diagnosis not present

## 2016-03-29 DIAGNOSIS — E119 Type 2 diabetes mellitus without complications: Secondary | ICD-10-CM | POA: Diagnosis not present

## 2016-03-29 DIAGNOSIS — Z8673 Personal history of transient ischemic attack (TIA), and cerebral infarction without residual deficits: Secondary | ICD-10-CM | POA: Insufficient documentation

## 2016-03-29 LAB — COMPREHENSIVE METABOLIC PANEL
ALBUMIN: 3.2 g/dL — AB (ref 3.5–5.0)
ALK PHOS: 57 U/L (ref 38–126)
ALT: 8 U/L — AB (ref 14–54)
ANION GAP: 9 (ref 5–15)
AST: 18 U/L (ref 15–41)
BILIRUBIN TOTAL: 0.6 mg/dL (ref 0.3–1.2)
BUN: 13 mg/dL (ref 6–20)
CALCIUM: 9.2 mg/dL (ref 8.9–10.3)
CO2: 26 mmol/L (ref 22–32)
CREATININE: 0.65 mg/dL (ref 0.44–1.00)
Chloride: 104 mmol/L (ref 101–111)
GFR calc non Af Amer: >60 mL/min (ref >60)
GLUCOSE: 96 mg/dL (ref 65–99)
Potassium: 3.8 mmol/L (ref 3.5–5.1)
SODIUM: 139 mmol/L (ref 135–145)
TOTAL PROTEIN: 6.9 g/dL (ref 6.5–8.1)

## 2016-03-29 LAB — CBC WITH DIFFERENTIAL/PLATELET
Basophils Absolute: 0 10*3/uL (ref 0.0–0.1)
Basophils Relative: 1 %
Eosinophils Absolute: 0.1 10*3/uL (ref 0.0–0.7)
Eosinophils Relative: 3 %
HEMATOCRIT: 35.7 % — AB (ref 36.0–46.0)
HEMOGLOBIN: 10.6 g/dL — AB (ref 12.0–15.0)
LYMPHS ABS: 0.8 10*3/uL (ref 0.7–4.0)
Lymphocytes Relative: 27 %
MCH: 26.6 pg (ref 26.0–34.0)
MCHC: 29.7 g/dL — AB (ref 30.0–36.0)
MCV: 89.5 fL (ref 78.0–100.0)
MONOS PCT: 7 %
Monocytes Absolute: 0.2 10*3/uL (ref 0.1–1.0)
NEUTROS ABS: 1.9 10*3/uL (ref 1.7–7.7)
NEUTROS PCT: 62 %
Platelets: 178 10*3/uL (ref 150–400)
RBC: 3.99 MIL/uL (ref 3.87–5.11)
RDW: 15.2 % (ref 11.5–15.5)
WBC: 2.9 10*3/uL — AB (ref 4.0–10.5)

## 2016-03-29 LAB — URINALYSIS, ROUTINE W REFLEX MICROSCOPIC
Bilirubin Urine: NEGATIVE
GLUCOSE, UA: NEGATIVE mg/dL
HGB URINE DIPSTICK: NEGATIVE
Ketones, ur: NEGATIVE mg/dL
LEUKOCYTES UA: NEGATIVE
Nitrite: NEGATIVE
PH: 6.5 (ref 5.0–8.0)
PROTEIN: 30 mg/dL — AB
SPECIFIC GRAVITY, URINE: 1.022 (ref 1.005–1.030)

## 2016-03-29 LAB — CBG MONITORING, ED: Glucose-Capillary: 83 mg/dL (ref 65–99)

## 2016-03-29 LAB — URINE MICROSCOPIC-ADD ON
BACTERIA UA: NONE SEEN
RBC / HPF: NONE SEEN RBC/hpf (ref 0–5)
Squamous Epithelial / LPF: NONE SEEN
WBC UA: NONE SEEN WBC/hpf (ref 0–5)

## 2016-03-29 LAB — TROPONIN I

## 2016-03-29 NOTE — ED Provider Notes (Signed)
[facility]-EMERGENCY DEPT Provider Note   CSN: [id number] Arrival date & time: 03/29/16  1620     History   Chief Complaint Chief Complaint  Patient presents with  . Near Syncope    HPI Brandy Bailey is a 82 y.o. female.  HPI  Patient presents after an episode of possible change in consciousness. The patient is alone, presenting via EMS providers. Per report, the patient's daughter found patient to have lost consciousness for several moments, and called EMS. The patient herself does not recall what may have transpired, prior to EMS call. However, she states that she has been generally well recently, denies recent pain, fever, nausea, dysuria. She denies current discomfort, nausea, weakness or any complaints. She requests discharge home. Per chart review the patient does have history of dementia, though the patient does seem to be answering questions briefly, appropriately, clearly.   Past Medical History:  Diagnosis Date  . Dementia   . Depression   . Hypercholesteremia   . Hypertension   . Osteoarthritis   . Type II diabetes mellitus [facility](HCC)     Patient Active Problem List   Diagnosis Date Noted  . Adjustment disorder with disturbance of emotion 07/09/2015  . MDD (major depressive disorder), recurrent episode, moderate (HCC) 03/26/2015  . Protein-calorie malnutrition, severe (HCC) 12/16/2014  . Cellulitis of right foot 12/15/2014  . Cellulitis 12/15/2014  . Depression 03/10/2013  . Acute encephalopathy 02/21/2013  . History of CVA (cerebrovascular accident) 02/21/2013  . Acute blood loss anemia 10/15/2012  . Closed left hip fracture (HCC) 10/13/2012  . Syncope and collapse 10/13/2012  . Diabetes (HCC) 10/13/2012  . Hypertension 10/13/2012    Past Surgical History:  Procedure Laterality Date  . CATARACT EXTRACTION W/ INTRAOCULAR LENS  IMPLANT, BILATERAL  1999-2004  . INTRAMEDULLARY (IM) NAIL INTERTROCHANTERIC Left 10/14/2012   Procedure: INTRAMEDULLARY (IM) NAIL  INTERTROCHANTRIC;  Surgeon: Eulas PostJoshua P Landau, MD;  Location: [facility] OR;  Service: Orthopedics;  Laterality: Left;    OB History    No data available       Home Medications    Prior to Admission medications   Medication Sig Start Date End Date Taking? Authorizing Provider  aspirin 325 MG EC tablet Take 325 mg by mouth daily as needed for pain.    Historical Provider, MD    Family History Family History  Problem Relation Age of Onset  . Family history unknown: Yes    Social History Social History  Substance Use Topics  . Smoking status: Never Smoker  . Smokeless tobacco: Never Used  . Alcohol use No     Comment: Patient denies      Allergies   Celecoxib and Zoloft [sertraline hcl]   Review of Systems Review of Systems  Unable to perform ROS: Dementia     Physical Exam Updated Vital Signs There were no vitals taken for this visit.  Physical Exam  Constitutional: She has a sickly appearance. No distress.  HENT:  Head: Normocephalic and atraumatic.  Eyes: Conjunctivae and EOM are normal.  Cardiovascular: Normal rate and regular rhythm.   Pulmonary/Chest: Effort normal and breath sounds normal. No stridor. No respiratory distress.  Abdominal: She exhibits no distension. There is no tenderness.  Musculoskeletal: She exhibits no edema.  Neurological: She is alert. She displays atrophy. She displays no tremor. No cranial nerve deficit.  Skin: Skin is warm and dry.     Psychiatric: She has a normal mood and affect. Her speech is delayed. She is slowed and withdrawn.  Cognition and memory are impaired.  Nursing note and vitals reviewed.    ED Treatments / Results  Labs (all labs ordered are listed, but only abnormal results are displayed) Labs Reviewed  CBC WITH DIFFERENTIAL/PLATELET - Abnormal; Notable for the following:       Result Value   WBC 2.9 (*)    Hemoglobin 10.6 (*)    HCT 35.7 (*)    MCHC 29.7 (*)    All other components within normal limits    COMPREHENSIVE METABOLIC PANEL - Abnormal; Notable for the following:    Albumin 3.2 (*)    ALT 8 (*)    All other components within normal limits  URINALYSIS, ROUTINE W REFLEX MICROSCOPIC (NOT AT [facility]) - Abnormal; Notable for the following:    Protein, ur 30 (*)    All other components within normal limits  TROPONIN I  URINE MICROSCOPIC-ADD ON  POCT CBG (FASTING - GLUCOSE)-MANUAL ENTRY  CBG MONITORING, ED   Procedures Procedures (including critical care time)  Chart review notable for visit 3 days ago after a month's older event. Patient has 17 emergency department visits in 6 months.   6:49 PM [name] is present, she states that the patient is now acting in a typical manner, but had unusual episode earlier today. She states the patient is unwilling to go to a nursing facility, will not let caregivers assist in the house.  Initial Impression / Assessment and Plan / ED Course  I have reviewed the triage vital signs and the nursing notes.  Pertinent labs & imaging results that were available during my care of the patient were reviewed by me and considered in my medical decision making (see chart for details).  On repeat exam the patient is in similar condition, remained hemodynamically stable, oxygen saturation 96% room air normal Cardiac 80 sinus normal  Abdomen female dementia presents after an episode of possible change in consciousness. Here the patient has a symptomatic, with no complaints. No evidence for ongoing arrhythmia, ischemia, infection.       Gerhard Munchobert Cadell Gabrielson, MD 03/29/16 1850

## 2016-03-29 NOTE — Discharge Instructions (Signed)
As discussed, your evaluation today has been largely reassuring.  But, it is important that you monitor your condition carefully, and do not hesitate to return to the ED if you develop new, or concerning changes in your condition. ? ?Otherwise, please follow-up with your physician for appropriate ongoing care. ? ?

## 2016-03-29 NOTE — ED Triage Notes (Signed)
Pt to ER BIB [facility] from home where patient lives with daughter. EMS was activated for "unconsciousness." on EMS arrival pt was found sitting in chair in living room watching television. Daughter was unable to tell EMS details of event and when EMS asked patient what happened she reported "I don't know." VSS. CBG WDL per EMS. Answers questions appropriately.

## 2016-04-03 ENCOUNTER — Emergency Department (HOSPITAL_COMMUNITY)
Admission: EM | Admit: 2016-04-03 | Discharge: 2016-04-04 | Disposition: A | Discharge status: Home / Self Care | Payer: Medicare Other | Attending: Emergency Medicine | Admitting: Emergency Medicine

## 2016-04-03 ENCOUNTER — Encounter (HOSPITAL_COMMUNITY): Payer: Self-pay | Admitting: Emergency Medicine

## 2016-04-03 DIAGNOSIS — I10 Essential (primary) hypertension: Secondary | ICD-10-CM | POA: Diagnosis not present

## 2016-04-03 DIAGNOSIS — E119 Type 2 diabetes mellitus without complications: Secondary | ICD-10-CM | POA: Diagnosis not present

## 2016-04-03 DIAGNOSIS — R531 Weakness: Secondary | ICD-10-CM | POA: Diagnosis present

## 2016-04-03 DIAGNOSIS — Z7982 Long term (current) use of aspirin: Secondary | ICD-10-CM | POA: Diagnosis not present

## 2016-04-03 DIAGNOSIS — T7401XA Adult neglect or abandonment, confirmed, initial encounter: Secondary | ICD-10-CM

## 2016-04-03 DIAGNOSIS — T7601XA Adult neglect or abandonment, suspected, initial encounter: Secondary | ICD-10-CM | POA: Diagnosis not present

## 2016-04-03 LAB — BASIC METABOLIC PANEL
ANION GAP: 5 (ref 5–15)
BUN: 19 mg/dL (ref 6–20)
CHLORIDE: 103 mmol/L (ref 101–111)
CO2: 31 mmol/L (ref 22–32)
Calcium: 9.2 mg/dL (ref 8.9–10.3)
Creatinine, Ser: 0.68 mg/dL (ref 0.44–1.00)
GFR calc Af Amer: >60 mL/min (ref >60)
GLUCOSE: 108 mg/dL — AB (ref 65–99)
POTASSIUM: 3.8 mmol/L (ref 3.5–5.1)
SODIUM: 139 mmol/L (ref 135–145)

## 2016-04-03 LAB — CBC
HCT: 36.4 % (ref 36.0–46.0)
Hemoglobin: 11.4 g/dL — ABNORMAL LOW (ref 12.0–15.0)
MCH: 26.8 pg (ref 26.0–34.0)
MCHC: 31.3 g/dL (ref 30.0–36.0)
MCV: 85.6 fL (ref 78.0–100.0)
PLATELETS: 168 10*3/uL (ref 150–400)
RBC: 4.25 MIL/uL (ref 3.87–5.11)
RDW: 15.1 % (ref 11.5–15.5)
WBC: 3.3 10*3/uL — AB (ref 4.0–10.5)
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
## 2016-04-04 NOTE — ED Notes (Cosign Needed)
Received sign out from previous provider, please refer to his note for more indepth info on today's visit.  Pt was found to live at home in poor state of health and unfavorable living environment.  Her daughter, who is her POA is also a pt today and has been admitted.  She's unable to care for herself at home. EMS note sts the living condition is concerning, as there are insect and trash throughout the house and pt have not eaten today. Social Work has evaluated pt and recommend placement due to her dementia, gait instability, unable to care for herself.  A face-to-face PT order was filled by me per Child psychotherapist request.  We are in the process of finding placement for pt to go.  Hx is difficult to obtain from pt due to her dementia.  Pt currently without any complaints.    11:24 AM Pt requested to return home after she was made aware that we are in the process of finding placement for her.  She is not safe to go back home without anyone home to care for her.  She does not have the mental capacity to make informed decision due to her dementia and her state of health.  I discussed with Dr. Verdie Mosher and we agree pt should be placed for further management.  Will continue to look for placement.  It would be prudent to have the responsible provider to reach out to patient's daughter [facility]) for update and obtain consent for placement.    12:23 PM Pt has been accepted to [facility].  She will need to be placed until her daughter can take her home once daughter is better.  Care discussed with Dr. Verdie Mosher.   BP 126/65   Pulse 72   Temp 98.3 F (36.8 C) (Oral)   Resp 18   Ht 5\' 7"  (1.702 m)   Wt 49.9 kg   SpO2 99%   BMI 17.23 kg/m   Results for orders placed or performed during the hospital encounter of 04/03/16  CBC  Result Value Ref Range   WBC 3.3 (L) 4.0 - 10.5 K/uL   RBC 4.25 3.87 - 5.11 MIL/uL   Hemoglobin 11.4 (L) 12.0 - 15.0 g/dL   HCT 16.1 09.6 - 04.5 %   MCV 85.6 78.0 - 100.0 fL   MCH 26.8  26.0 - 34.0 pg   MCHC 31.3 30.0 - 36.0 g/dL   RDW 40.9 81.1 - 91.4 %   Platelets 168 150 - 400 K/uL  Basic metabolic panel  Result Value Ref Range   Sodium 139 135 - 145 mmol/L   Potassium 3.8 3.5 - 5.1 mmol/L   Chloride 103 101 - 111 mmol/L   CO2 31 22 - 32 mmol/L   Glucose, Bld 108 (H) 65 - 99 mg/dL   BUN 19 6 - 20 mg/dL   Creatinine, Ser 7.82 0.44 - 1.00 mg/dL   Calcium 9.2 8.9 - 95.6 mg/dL   GFR calc non Af Amer >60 >60 mL/min   GFR calc Af Amer >60 >60 mL/min   Anion gap 5 5 - 15   Dg Chest 2 View  Result Date: 03/29/2016 CLINICAL DATA:  Unresponsive EXAM: CHEST  2 VIEW COMPARISON:  02/15/2016 FINDINGS: Cardiac shadow is within normal limits. Mild aortic calcifications are seen. A hiatal hernia is again identified. The lungs are well aerated without focal infiltrate. No acute bony abnormality is seen. IMPRESSION: Stable aortic atherosclerosis. No acute abnormality noted. Electronically Signed   By:  Alcide CleverMark  Lukens M.D.   On: 03/29/2016 17:28        Fayrene HelperBowie Eric Morganti, PA-C 04/04/16 1230

## 2016-04-04 NOTE — ED Notes (Signed)
Crackers and decaf coffee provided to pt

## 2016-04-04 NOTE — ED Notes (Signed)
Pt assisted to restroom.  

## 2016-04-04 NOTE — ED Notes (Signed)
PT is at beside evaluating pt. I have been informed that once evaluation is complete, the decision for disposition will be revisited once [facility] received PT eval records.

## 2016-04-04 NOTE — Progress Notes (Addendum)
CSW spoke with patient at bedside with no family present. Per PA-C note, patient has history of dementia. Patient reports she cannot remember and she does not know why she is in the [facility]. Patient reports she does not know if she came by ambulance. Patient reports she may be here for her arm. Patient reports "it got caught up in something" (speaking of her arm). Patient reports she lives with her daughter and her daughter takes good care of her at home by cooking and cleaning. CSW asked patient what her daughter had been cooking for her to eat and she stated vegetables, naming squash, cabbage, potatoes, and green beans. CSW asked patient if she had been eating any meats and she stated lasagna. CSW asked patient if her daughter has attempted to harm her and she stated "no, she takes good care of me". CSW asked patient about the bruises on her arms. Patient reports she bruises easily and she falls a lot. Patient reports she fell at one time on her deck and her daughter called someone to help get her up. CSW asked patient if she had any bugs and insects in her home and she stated "not that I know of". Patient reports she has been to an ALF before and she did not like being there, therefore, she states she began walking home. Patient reports she does not want to go to a facility and that she wants to go home. CSW informed patient that would be the  decision at this time.   CSW staffed with PA-C stated he would staff with EDP. PA-C stated for patient's safety, facility placement is recommended at this time, as patient's daughter is hospitalized.   Spoke with Patton Salles, RN with [facility] regarding patient being accepted through [facility]. She stated she would call the facility and let them know about patient coming, as she states if patient has been accepted through the HUB then she has been accepted at their facility. She stated to inform her when patient is on the way to the facility.     CSW spoke  with patient's daughter who agrees for patient to go into a facility. She stated she would sign paperwork for patient. CSW spoke with Patton Salles who spoke with admissions. She stated patient would be able to be sent without paperwork completed at this time.   CSW faxed the AVS to Clydie Braun, Admissions with [facility] [phone] [phone]   CSW spoke with Nurse to call [facility], however, the PT evaluation had not been completed. Nurse stated no consult was placed and she placed the order for patient to be seen by PT.   Call received from Clydie Braun and she stated from the AVS, patient more than likely be immediately discharged, as she states the AVS does not state reasoning for skilled. CSW informed that the PT evaluation had not been completed and the consult was now placed by the   Spoke with patient's daughter to inform her that a PT eval would need to be done before patient could be sent to the facility. She wanted to speak with patient. CSW provided her with the number for TCU.   Call from Clydie Braun, Physical Therapist stating the evaluation had been completed.   Spoke with Elease Hashimoto at [facility] to inform her that the PT eval had been completed. She stated they could see it in the system and patient could now be transported to their facility.   Spoke with Nurse, Jari Favre to inform that patient could  now be sent to [facility]. He stated the address had been put in the system.  Call attempted to patient's daughter, Brandy PartridgeMiriam to inform her that patient will be transported to [facility]. Her Nurse stated she had been taken for a procedure.    Brandy PaddyLaVonia Zoriana Bailey, LCSWA [phone] ED CSW 04/04/2016 3:41 PM

## 2016-04-04 NOTE — NC FL2 (Signed)
  [place] MEDICAID FL2 LEVEL OF CARE SCREENING TOOL     IDENTIFICATION  Patient Name: Brandy Bailey Birthdate: 11/08/1926 Sex: female Admission Date (Current Location): 04/03/2016  [facility] [place]   Facility and Address:  [facility],  [address], [place] [id number]      Provider Number: [id number]  Attending Physician Name and Address:  Lavera Guiseana Duo Liu, MD  Relative Name and Phone Number:       Current Level of Care: SNF Recommended Level of Care: Skilled Nursing Facility Prior Approval Number:    Date Approved/Denied:   PASRR Number:  [id number] [id number]  Discharge Plan: SNF    Current Diagnoses: Patient Active Problem List   Diagnosis Date Noted  . Adjustment disorder with disturbance of emotion 07/09/2015  . MDD (major depressive disorder), recurrent episode, moderate (HCC) 03/26/2015  . Protein-calorie malnutrition, severe (HCC) 12/16/2014  . Cellulitis of right foot 12/15/2014  . Cellulitis 12/15/2014  . Depression 03/10/2013  . Acute encephalopathy 02/21/2013  . History of CVA (cerebrovascular accident) 02/21/2013  . Acute blood loss anemia 10/15/2012  . Closed left hip fracture (HCC) 10/13/2012  . Syncope and collapse 10/13/2012  . Diabetes (HCC) 10/13/2012  . Hypertension 10/13/2012    Orientation RESPIRATION BLADDER Height & Weight     Self  Normal Continent Weight: 110 lb (49.9 kg) Height:  5\' 7"  (170.2 cm)  BEHAVIORAL SYMPTOMS/MOOD NEUROLOGICAL BOWEL NUTRITION STATUS      Continent Diet  AMBULATORY STATUS COMMUNICATION OF NEEDS Skin   Independent Verbally Normal                       Personal Care Assistance Level of Assistance  Bathing, Feeding, Dressing Bathing Assistance: Independent Feeding assistance: Independent Dressing Assistance: Independent     Functional Limitations Info  Sight, Hearing, Speech Sight Info: Adequate Hearing Info: Adequate Speech Info: Adequate    SPECIAL CARE FACTORS  FREQUENCY   (Per Nurse, patient is diabetic and has hypertension)                    Contractures      Additional Factors Info  Code Status, Allergies Code Status Info:  (Prior) Allergies Info:  (Celecoxib, Zoloft (Sertraline Hcl) )           Current Medications (04/04/2016):  This is the current hospital active medication list No current facility-administered medications for this encounter.    Current Outpatient Prescriptions  Medication Sig Dispense Refill  . aspirin 325 MG EC tablet Take 325 mg by mouth daily as needed for pain.       Discharge Medications:   Relevant Imaging Results:  Relevant Lab Results:   Additional Information SS# [id number]  Brandy SeveranceLaVonia M Mahina Salatino, LCSW

## 2016-04-04 NOTE — ED Notes (Signed)
[facility] has be recalled.

## 2016-04-04 NOTE — Evaluation (Signed)
Physical Therapy Evaluation Patient Details Name: Brandy SaupeClara H Bailey MRN: [id number] DOB: [birth year] Today's Date: 04/04/2016   History of Present Illness  Brandy Bailey is a 82 y.o. female with PMHx of Dementia, DM, HTN, and Depression who presents to the Emergency Department by EMS for weakness. Per chart, has dementia and only daughter is in hospital.  Clinical Impression  The patient is very pleasant and participatory. She ambulates  With a RW with steady assistance and requires more assistance without RW.  Recommend 24 hour care givers. Pt admitted with above diagnosis. Pt currently with functional limitations due to the deficits listed below (see PT Problem List). Pt will benefit from skilled PT to increase their independence and safety with mobility to allow discharge to the venue listed below.       Follow Up Recommendations SNF;Supervision/Assistance - 24 hour    Equipment Recommendations  None recommended by PT    Recommendations for Other Services       Precautions / Restrictions Precautions Precautions: Fall      Mobility  Bed Mobility Overal bed mobility: Independent                Transfers Overall transfer level: Needs assistance Equipment used: Rolling walker (2 wheeled);None Transfers: Sit to/from Stand Sit to Stand: Min guard;Min assist         General transfer comment: min guard with RW, min assist to steady without  RW.  Ambulation/Gait Ambulation/Gait assistance: Min guard;Min assist Ambulation Distance (Feet): 200 Feet Assistive device: Rolling walker (2 wheeled) Gait Pattern/deviations: Step-through pattern     General Gait Details: then 100' with HHA, gait is less steady without RW. Knees are slightly bent.  Stairs            Wheelchair Mobility    Modified Rankin (Stroke Patients Only)       Balance Overall balance assessment: Needs assistance Sitting-balance support: Feet supported;No upper extremity supported Sitting  balance-Leahy Scale: Good     Standing balance support: During functional activity;No upper extremity supported Standing balance-Leahy Scale: Fair                               Pertinent Vitals/Pain Pain Assessment: No/denies pain    Home Living Family/patient expects to be discharged to:: Skilled nursing facility                      Prior Function Level of Independence: Independent               Hand Dominance        Extremity/Trunk Assessment   Upper Extremity Assessment: Generalized weakness           Lower Extremity Assessment: Generalized weakness         Communication   Communication: No difficulties  Cognition Arousal/Alertness: Awake/alert Behavior During Therapy: WFL for tasks assessed/performed Overall Cognitive Status: No family/caregiver present to determine baseline cognitive functioning Area of Impairment: Orientation Orientation Level: Time                  General Comments      Exercises        Assessment/Plan    PT Assessment Patient needs continued PT services  PT Diagnosis Difficulty walking;Generalized weakness   PT Problem List Decreased strength;Decreased activity tolerance;Decreased balance;Decreased cognition  PT Treatment Interventions DME instruction;Gait training;Functional mobility training;Therapeutic activities;Balance training;Cognitive remediation   PT Goals (Current goals can  be found in the Care Plan section) Acute Rehab PT Goals Patient Stated Goal: I want tio go home PT Goal Formulation: Patient unable to participate in goal setting Time For Goal Achievement: 04/18/16 Potential to Achieve Goals: Good    Frequency Min 3X/week   Barriers to discharge Decreased caregiver support      Co-evaluation               End of Session Equipment Utilized During Treatment: Gait belt Activity Tolerance: Patient tolerated treatment well Patient left: in bed;with call bell/phone  within reach Nurse Communication: Mobility status    Functional Assessment Tool Used: clinical judgement Functional Limitation: Mobility: Walking and moving around Mobility: Walking and Moving Around Current Status (Z6109): At least 20 percent but less than 40 percent impaired, limited or restricted Mobility: Walking and Moving Around Goal Status [phone]): 0 percent impaired, limited or restricted    Time: 1500-1513 PT Time Calculation (min) (ACUTE ONLY): 13 min   Charges:         PT G Codes:   PT G-Codes **NOT FOR INPATIENT CLASS** Functional Assessment Tool Used: clinical judgement Functional Limitation: Mobility: Walking and moving around Mobility: Walking and Moving Around Current Status (U9811): At least 20 percent but less than 40 percent impaired, limited or restricted Mobility: Walking and Moving Around Goal Status [phone]): 0 percent impaired, limited or restricted    [name] 04/04/2016, 3:21 PM Blanchard Kelch PT [phone]

## 2016-04-04 NOTE — ED Notes (Signed)
Bed: WA31 Expected date:  Expected time:  Means of arrival:  Comments: Hold for 14 

## 2016-08-11 DEATH — deceased

## 2016-09-21 IMAGING — DX DG SHOULDER 2+V*R*
3 series · 3 of 3 positions shown · non-contrast
Comparison: Shoulder MRI 02/23/2006, right shoulder radiographs
10/15/2005

CLINICAL DATA: Twisting injury to the right shoulder yesterday,
pain

EXAM:
RIGHT SHOULDER - 2+ VIEW

[shoulder grashey]
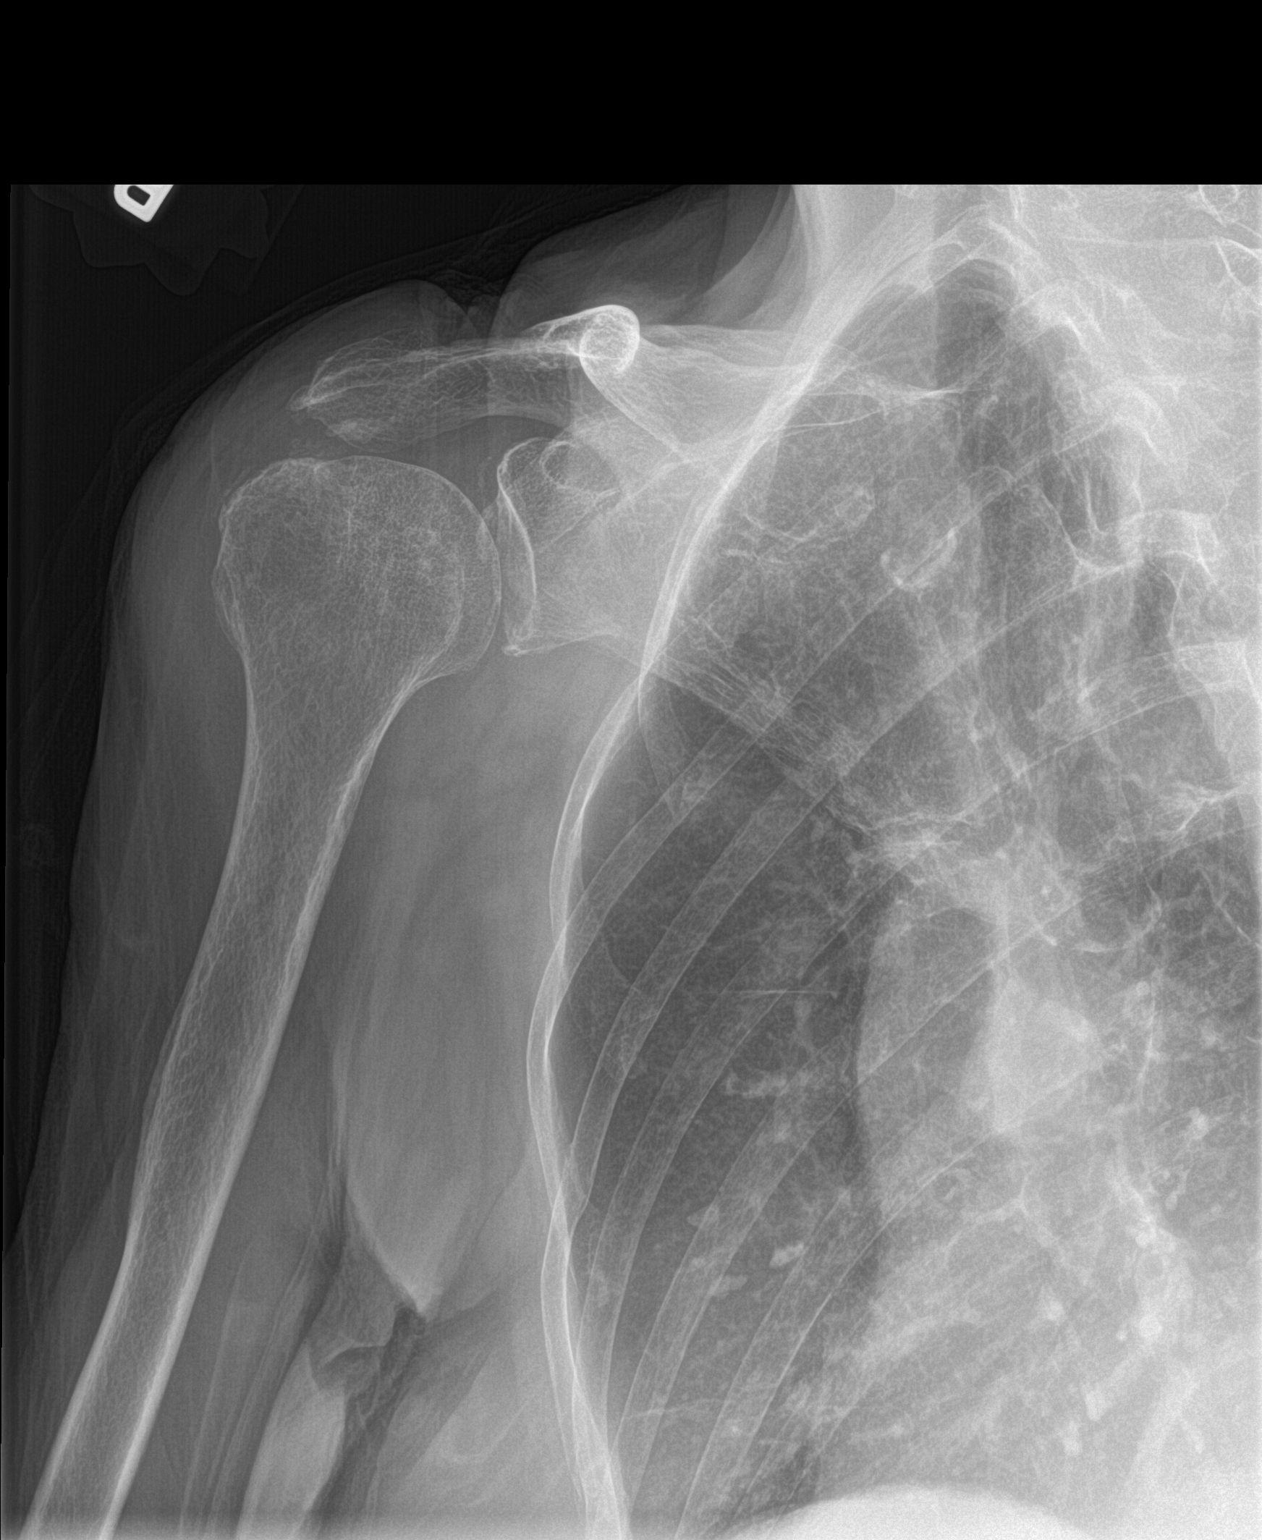

[shoulder y view]
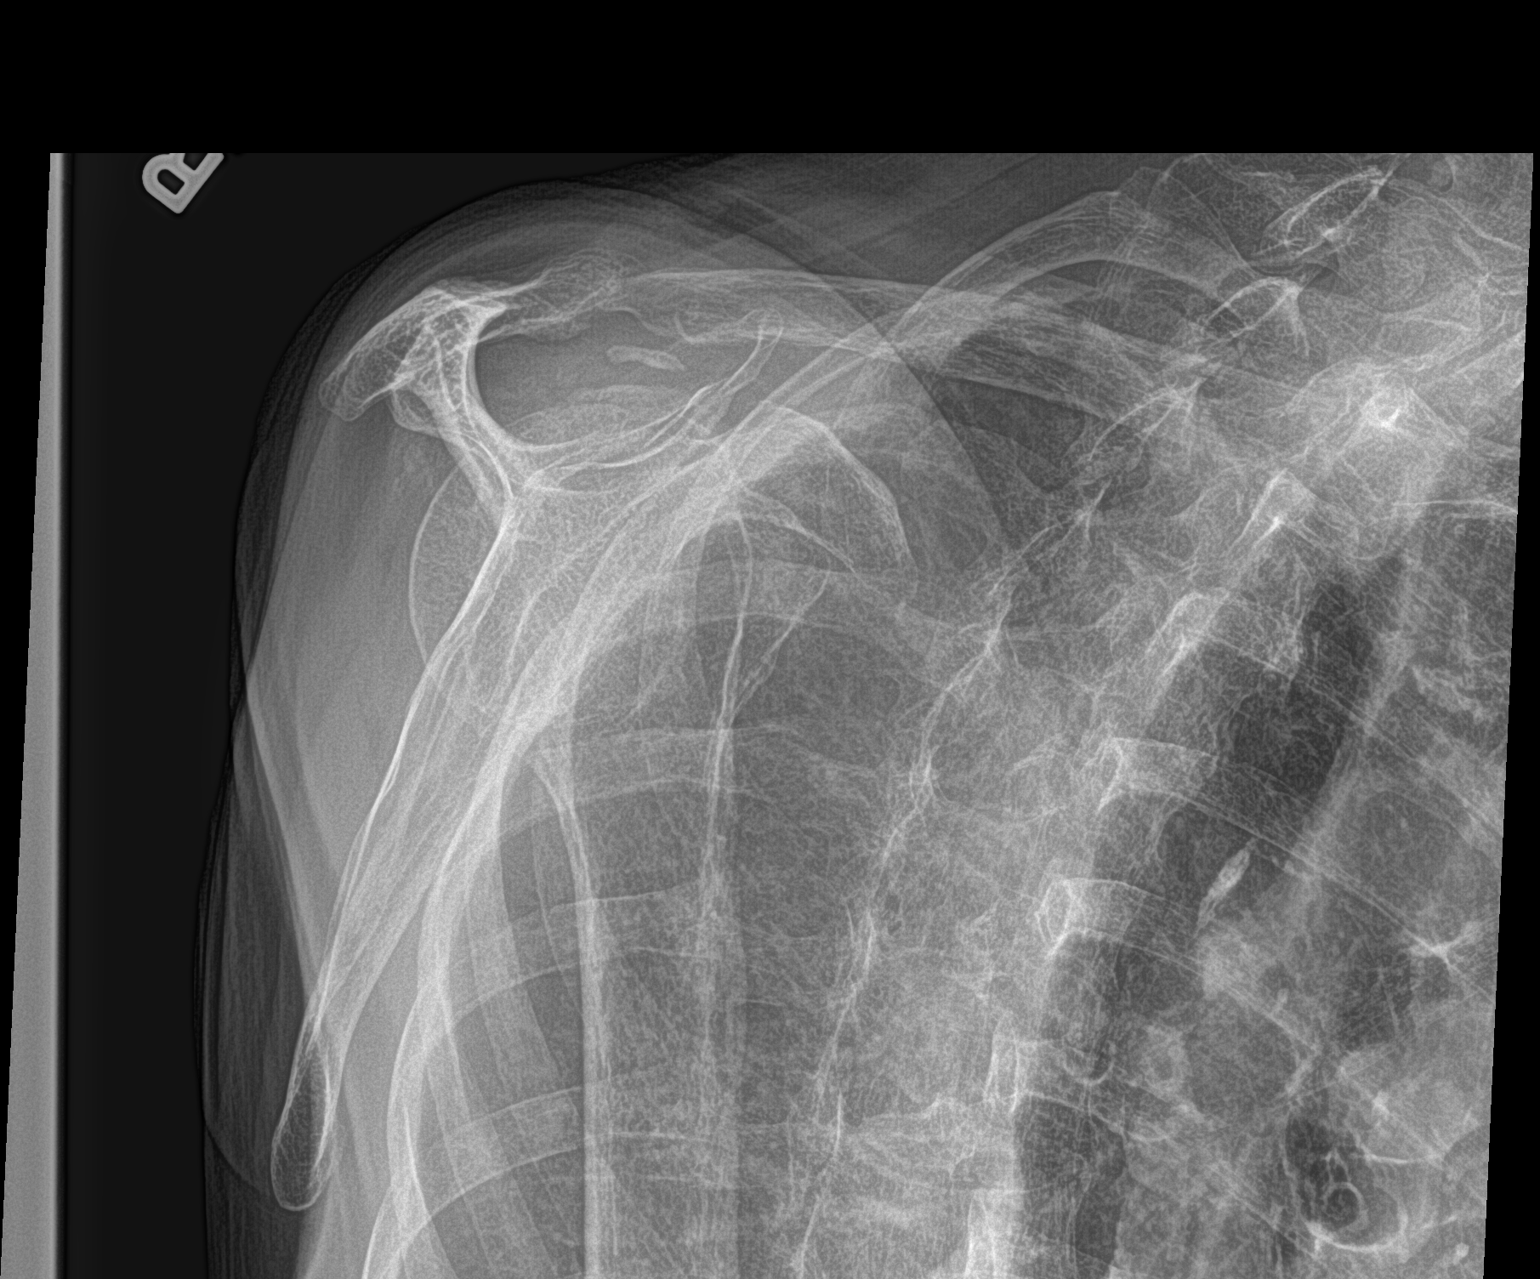

[shoulder axillary]
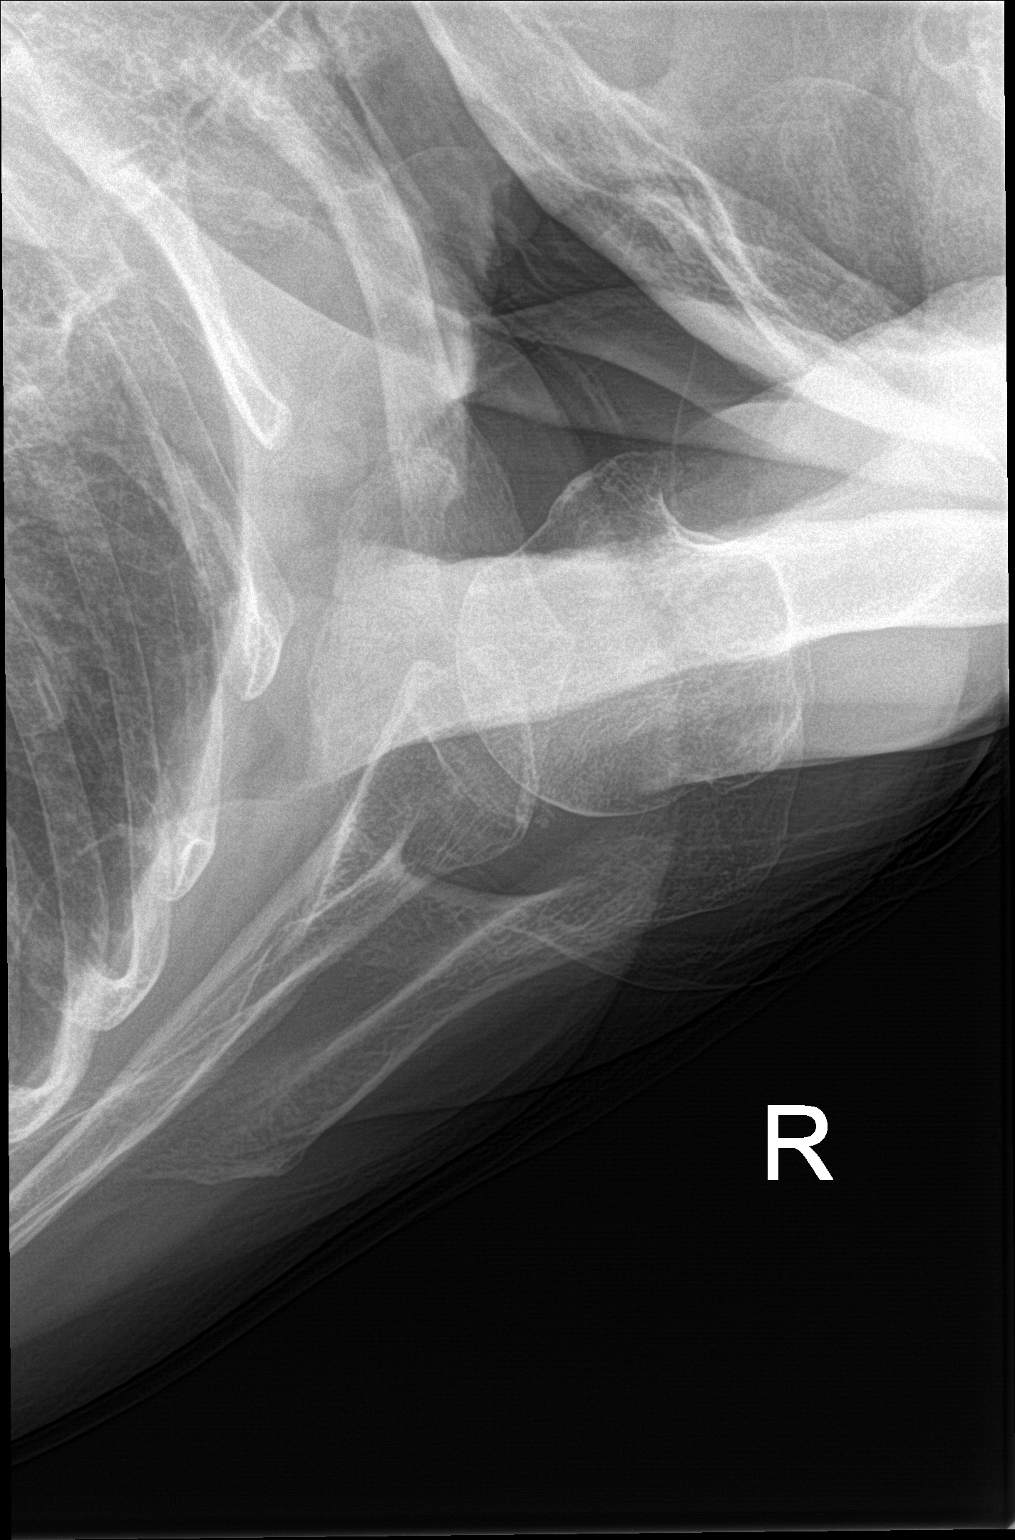

[3 of 3 positions shown; findings below may reference images not displayed]

FINDINGS: Prominent costochondral calcifications are noted projecting over the
right hemi thorax. No right proximal humeral fracture or dislocation
is identified. Degenerative change at the acromioclavicular joint is
noted. Evidence of probable calcific tendinitis noted versus loose
body. Right lung apex is clear. The bones are subjectively
osteopenic.
IMPRESSION: No right humeral fracture or dislocation.

## 2016-12-22 IMAGING — CR DG CHEST 2V
2 series · 2 of 2 positions shown · non-contrast
Comparison: February 21, 2013

CLINICAL DATA: Confusion.  Hypertension.

EXAM:
CHEST  2 VIEW

[chest lat]
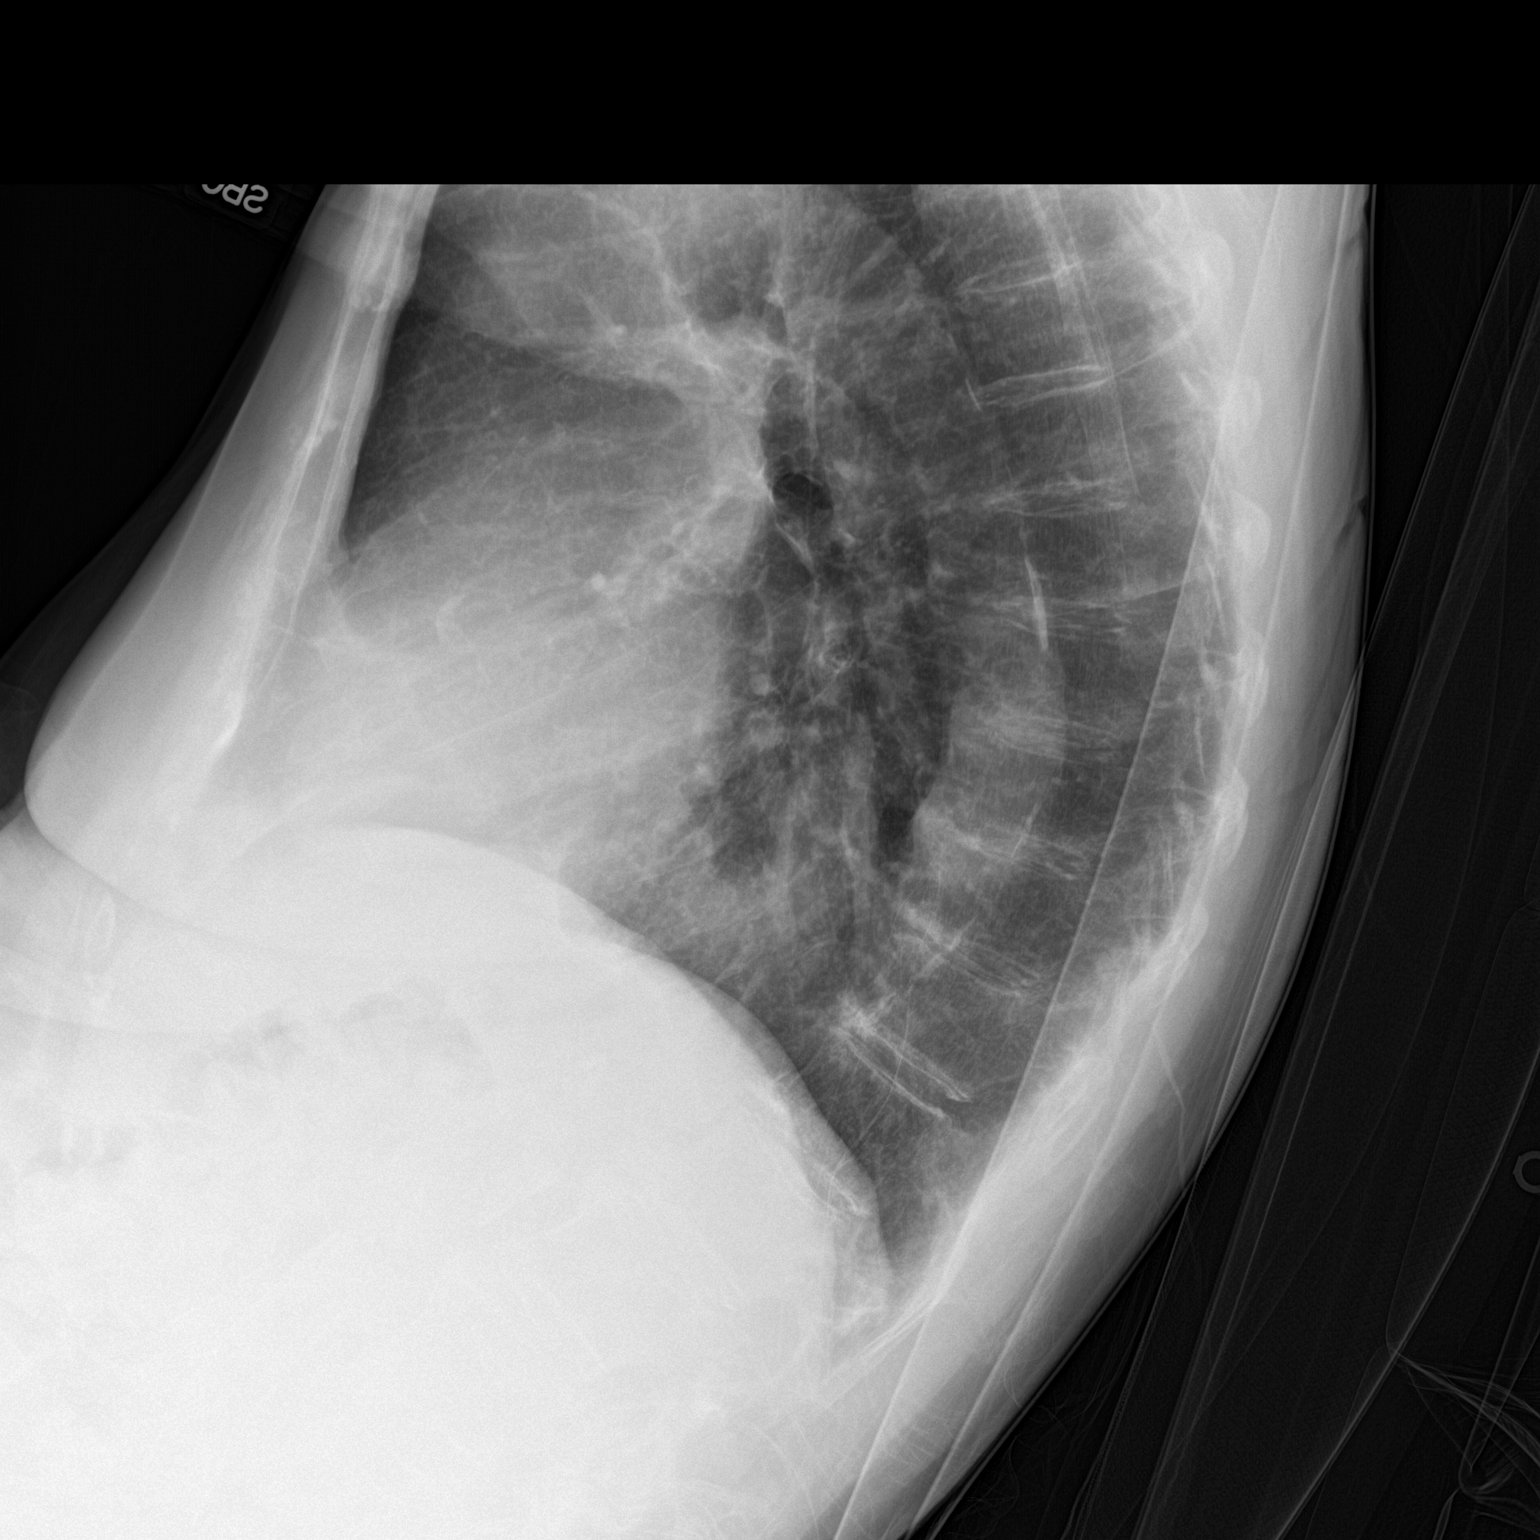

[chest ap]
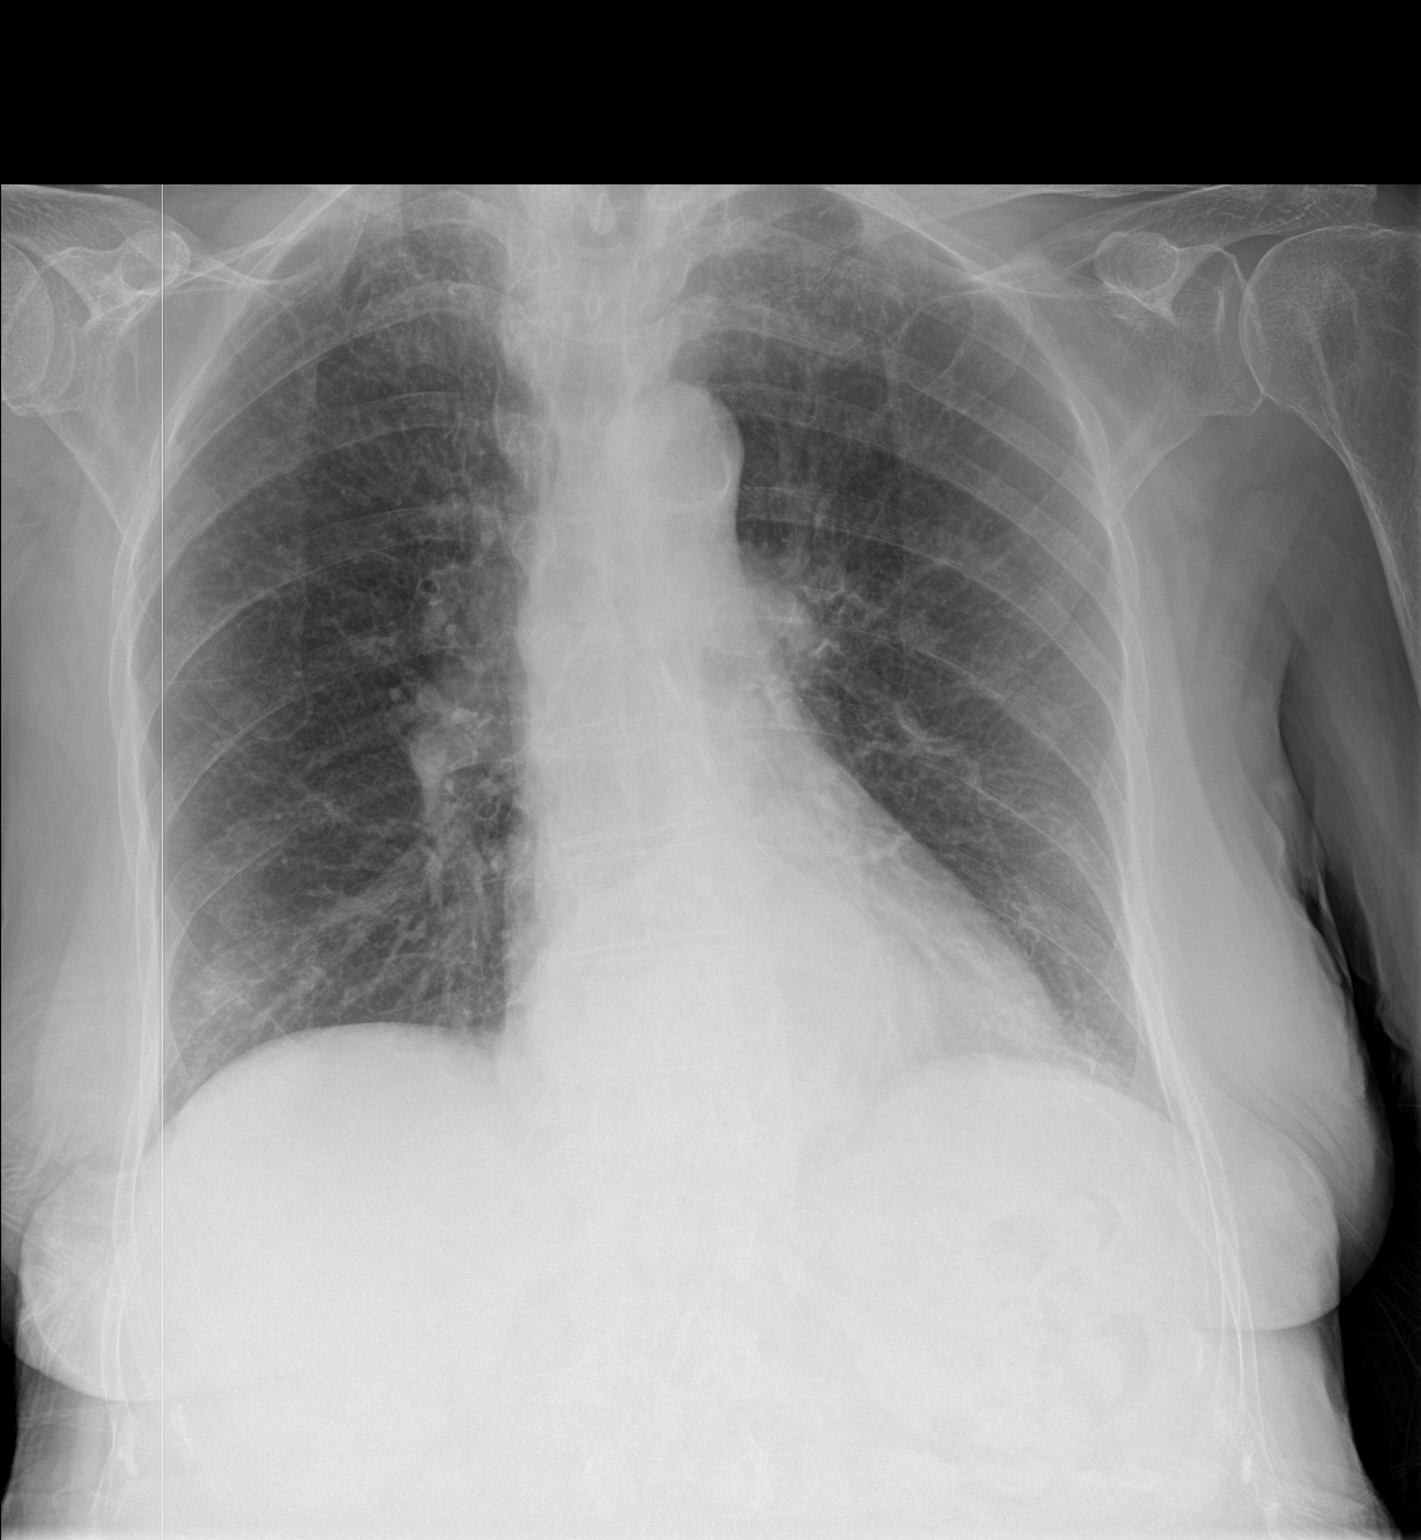

[2 of 2 positions shown; findings below may reference images not displayed]

FINDINGS: Lungs are clear. Heart size and pulmonary vascularity are normal. No
adenopathy. There is atherosclerotic change in aorta. There is a
moderate hiatal hernia. There is degenerative change in the thoracic
spine. Bones are osteoporotic. There is stable postoperative change
involving the lateral right clavicle.
IMPRESSION: No edema or consolidation.  Moderate hiatal hernia.  No adenopathy.
